# Patient Record
Sex: Female | Born: 1935 | Race: White | Hispanic: No | Smoking: Former smoker
Health system: Southern US, Community
[De-identification: ages and names within clinical notes are randomized; demographics above are authoritative.]

## PROBLEM LIST (undated history)

## (undated) DIAGNOSIS — K529 Noninfective gastroenteritis and colitis, unspecified: Secondary | ICD-10-CM

## (undated) DIAGNOSIS — E039 Hypothyroidism, unspecified: Secondary | ICD-10-CM

## (undated) DIAGNOSIS — I480 Paroxysmal atrial fibrillation: Secondary | ICD-10-CM

## (undated) DIAGNOSIS — Z9289 Personal history of other medical treatment: Secondary | ICD-10-CM

## (undated) DIAGNOSIS — K219 Gastro-esophageal reflux disease without esophagitis: Secondary | ICD-10-CM

## (undated) DIAGNOSIS — I10 Essential (primary) hypertension: Secondary | ICD-10-CM

## (undated) DIAGNOSIS — G8929 Other chronic pain: Secondary | ICD-10-CM

## (undated) DIAGNOSIS — I700 Atherosclerosis of aorta: Secondary | ICD-10-CM

## (undated) DIAGNOSIS — I447 Left bundle-branch block, unspecified: Secondary | ICD-10-CM

## (undated) DIAGNOSIS — M545 Low back pain, unspecified: Secondary | ICD-10-CM

## (undated) DIAGNOSIS — M1990 Unspecified osteoarthritis, unspecified site: Secondary | ICD-10-CM

## (undated) DIAGNOSIS — F0390 Unspecified dementia without behavioral disturbance: Secondary | ICD-10-CM

## (undated) HISTORY — PX: APPENDECTOMY: SHX54

## (undated) HISTORY — PX: CHOLECYSTECTOMY: SHX55

## (undated) HISTORY — PX: COLONOSCOPY: SHX174

## (undated) HISTORY — DX: Left bundle-branch block, unspecified: I44.7

## (undated) HISTORY — DX: Gastro-esophageal reflux disease without esophagitis: K21.9

## (undated) HISTORY — DX: Paroxysmal atrial fibrillation: I48.0

## (undated) HISTORY — PX: TONSILLECTOMY AND ADENOIDECTOMY: SUR1326

## (undated) HISTORY — DX: Hypothyroidism, unspecified: E03.9

## (undated) HISTORY — DX: Essential (primary) hypertension: I10

## (undated) HISTORY — DX: Personal history of other medical treatment: Z92.89

---

## 1997-05-06 ENCOUNTER — Ambulatory Visit (HOSPITAL_COMMUNITY): Admission: RE | Admit: 1997-05-06 | Discharge: 1997-05-06 | Payer: Self-pay | Admitting: Obstetrics & Gynecology

## 2007-04-24 ENCOUNTER — Ambulatory Visit: Payer: Self-pay | Admitting: Gastroenterology

## 2007-05-14 ENCOUNTER — Encounter: Payer: Self-pay | Admitting: Gastroenterology

## 2007-05-14 ENCOUNTER — Ambulatory Visit: Payer: Self-pay | Admitting: Gastroenterology

## 2007-05-14 DIAGNOSIS — K2940 Chronic atrophic gastritis without bleeding: Secondary | ICD-10-CM | POA: Insufficient documentation

## 2007-05-14 DIAGNOSIS — K5730 Diverticulosis of large intestine without perforation or abscess without bleeding: Secondary | ICD-10-CM | POA: Insufficient documentation

## 2007-07-22 ENCOUNTER — Ambulatory Visit: Payer: Self-pay | Admitting: Gastroenterology

## 2007-07-22 DIAGNOSIS — K5909 Other constipation: Secondary | ICD-10-CM

## 2007-07-22 DIAGNOSIS — R1013 Epigastric pain: Secondary | ICD-10-CM

## 2007-07-22 LAB — CONVERTED CEMR LAB
AST: 33 units/L (ref 0–37)
Alkaline Phosphatase: 41 units/L (ref 39–117)
Amylase: 106 units/L (ref 27–131)
Basophils Absolute: 0 10*3/uL (ref 0.0–0.1)
Basophils Relative: 0.4 % (ref 0.0–1.0)
Bilirubin, Direct: 0.1 mg/dL (ref 0.0–0.3)
Eosinophils Absolute: 0.1 10*3/uL (ref 0.0–0.7)
Lymphocytes Relative: 23.8 % (ref 12.0–46.0)
MCHC: 35 g/dL (ref 30.0–36.0)
MCV: 93.5 fL (ref 78.0–100.0)
Neutrophils Relative %: 64.3 % (ref 43.0–77.0)
Platelets: 247 10*3/uL (ref 150–400)
RBC: 4.19 M/uL (ref 3.87–5.11)
RDW: 13 % (ref 11.5–14.6)
Total Bilirubin: 0.9 mg/dL (ref 0.3–1.2)

## 2007-07-23 ENCOUNTER — Ambulatory Visit: Payer: Self-pay | Admitting: Cardiovascular Disease

## 2007-07-26 ENCOUNTER — Telehealth: Payer: Self-pay | Admitting: Gastroenterology

## 2007-07-31 ENCOUNTER — Telehealth: Payer: Self-pay | Admitting: Gastroenterology

## 2007-08-20 ENCOUNTER — Ambulatory Visit: Payer: Self-pay | Admitting: Gastroenterology

## 2007-08-23 ENCOUNTER — Ambulatory Visit (HOSPITAL_COMMUNITY): Admission: RE | Admit: 2007-08-23 | Discharge: 2007-08-23 | Payer: Self-pay | Admitting: Gastroenterology

## 2007-08-29 ENCOUNTER — Telehealth: Payer: Self-pay | Admitting: Gastroenterology

## 2008-05-05 ENCOUNTER — Inpatient Hospital Stay (HOSPITAL_COMMUNITY): Admission: EM | Admit: 2008-05-05 | Discharge: 2008-05-07 | Payer: Self-pay | Admitting: Emergency Medicine

## 2008-05-05 ENCOUNTER — Ambulatory Visit: Payer: Self-pay | Admitting: *Deleted

## 2008-05-07 ENCOUNTER — Encounter: Payer: Self-pay | Admitting: Cardiology

## 2008-05-07 HISTORY — PX: US ECHOCARDIOGRAPHY: HXRAD669

## 2009-03-24 HISTORY — PX: CARDIOVASCULAR STRESS TEST: SHX262

## 2009-09-21 ENCOUNTER — Ambulatory Visit: Payer: Self-pay | Admitting: Cardiology

## 2010-03-30 ENCOUNTER — Ambulatory Visit: Payer: Self-pay | Admitting: Cardiology

## 2010-05-11 LAB — CBC
HCT: 35.7 % — ABNORMAL LOW (ref 36.0–46.0)
HCT: 35.7 % — ABNORMAL LOW (ref 36.0–46.0)
Hemoglobin: 12.1 g/dL (ref 12.0–15.0)
Hemoglobin: 12.3 g/dL (ref 12.0–15.0)
MCHC: 34 g/dL (ref 30.0–36.0)
MCHC: 34.3 g/dL (ref 30.0–36.0)
MCV: 95.4 fL (ref 78.0–100.0)
MCV: 95.9 fL (ref 78.0–100.0)
Platelets: 170 10*3/uL (ref 150–400)
Platelets: 211 10*3/uL (ref 150–400)
RBC: 3.72 MIL/uL — ABNORMAL LOW (ref 3.87–5.11)
RBC: 3.74 MIL/uL — ABNORMAL LOW (ref 3.87–5.11)
RDW: 14.1 % (ref 11.5–15.5)
RDW: 14.4 % (ref 11.5–15.5)
WBC: 5.9 10*3/uL (ref 4.0–10.5)
WBC: 7.4 10*3/uL (ref 4.0–10.5)

## 2010-05-11 LAB — CARDIAC PANEL(CRET KIN+CKTOT+MB+TROPI)
CK, MB: 2.2 ng/mL (ref 0.3–4.0)
Relative Index: INVALID (ref 0.0–2.5)
Relative Index: INVALID (ref 0.0–2.5)
Total CK: 70 U/L (ref 7–177)
Troponin I: 0.12 ng/mL — ABNORMAL HIGH (ref 0.00–0.06)
Troponin I: 0.18 ng/mL — ABNORMAL HIGH (ref 0.00–0.06)

## 2010-05-11 LAB — CK TOTAL AND CKMB (NOT AT ARMC)
CK, MB: 1.2 ng/mL (ref 0.3–4.0)
Relative Index: INVALID (ref 0.0–2.5)
Total CK: 68 U/L (ref 7–177)

## 2010-05-11 LAB — POCT I-STAT, CHEM 8
BUN: 16 mg/dL (ref 6–23)
Calcium, Ion: 1.08 mmol/L — ABNORMAL LOW (ref 1.12–1.32)
Chloride: 107 mEq/L (ref 96–112)
Creatinine, Ser: 1.3 mg/dL — ABNORMAL HIGH (ref 0.4–1.2)
Glucose, Bld: 121 mg/dL — ABNORMAL HIGH (ref 70–99)
HCT: 35 % — ABNORMAL LOW (ref 36.0–46.0)
Hemoglobin: 11.9 g/dL — ABNORMAL LOW (ref 12.0–15.0)
Potassium: 3.3 mEq/L — ABNORMAL LOW (ref 3.5–5.1)
Sodium: 138 mEq/L (ref 135–145)
TCO2: 20 mmol/L (ref 0–100)

## 2010-05-11 LAB — BASIC METABOLIC PANEL
BUN: 8 mg/dL (ref 6–23)
BUN: 9 mg/dL (ref 6–23)
CO2: 23 mEq/L (ref 19–32)
CO2: 27 mEq/L (ref 19–32)
Calcium: 8.8 mg/dL (ref 8.4–10.5)
Calcium: 9 mg/dL (ref 8.4–10.5)
Chloride: 111 mEq/L (ref 96–112)
Creatinine, Ser: 0.81 mg/dL (ref 0.4–1.2)
Creatinine, Ser: 0.93 mg/dL (ref 0.4–1.2)
GFR calc Af Amer: >60 mL/min (ref >60)
GFR calc non Af Amer: >60 mL/min (ref >60)
Glucose, Bld: 124 mg/dL — ABNORMAL HIGH (ref 70–99)
Glucose, Bld: 98 mg/dL (ref 70–99)
Potassium: 4 mEq/L (ref 3.5–5.1)
Sodium: 141 mEq/L (ref 135–145)
Sodium: 143 mEq/L (ref 135–145)

## 2010-05-11 LAB — DIFFERENTIAL
Eosinophils Absolute: 0.1 10*3/uL (ref 0.0–0.7)
Eosinophils Relative: 2 % (ref 0–5)
Lymphocytes Relative: 32 % (ref 12–46)
Lymphs Abs: 2.4 10*3/uL (ref 0.7–4.0)
Monocytes Relative: 14 % — ABNORMAL HIGH (ref 3–12)

## 2010-05-11 LAB — LIPID PANEL
Cholesterol: 153 mg/dL (ref 0–200)
HDL: 69 mg/dL (ref >39)
LDL Cholesterol: 75 mg/dL (ref 0–99)
Total CHOL/HDL Ratio: 2.2 RATIO
Triglycerides: 44 mg/dL (ref <150)
VLDL: 9 mg/dL (ref 0–40)

## 2010-05-11 LAB — POCT CARDIAC MARKERS
CKMB, poc: <1 ng/mL — ABNORMAL LOW (ref 1.0–8.0)
CKMB, poc: <1 ng/mL — ABNORMAL LOW (ref 1.0–8.0)
Myoglobin, poc: 65.6 ng/mL (ref 12–200)
Myoglobin, poc: 73.7 ng/mL (ref 12–200)
Troponin i, poc: <0.05 ng/mL (ref 0.00–0.09)

## 2010-05-11 LAB — TSH: TSH: 4.297 u[IU]/mL (ref 0.350–4.500)

## 2010-05-11 LAB — PROTIME-INR
INR: 1.1 (ref 0.00–1.49)
Prothrombin Time: 14 seconds (ref 11.6–15.2)

## 2010-05-11 LAB — HEPARIN LEVEL (UNFRACTIONATED): Heparin Unfractionated: 0.39 IU/mL (ref 0.30–0.70)

## 2010-05-11 LAB — APTT: aPTT: 37 seconds (ref 24–37)

## 2010-06-14 NOTE — H&P (Signed)
Gina Terry, Gina Terry                 ACCOUNT NO.:  [id number]   MEDICAL RECORD NO.:  [id number]          PATIENT TYPE:  EMS   LOCATION:  MAJO                         FACILITY:  [facility]   PHYSICIAN:  Unice Cobble, MD     DATE OF BIRTH:  1921/07/17   DATE OF ADMISSION:  05/05/2008  DATE OF DISCHARGE:                              HISTORY & PHYSICAL   CARDIOLOGIST:  Dr. Patty Sermons of [facility].   CHIEF COMPLAINTS:  Chest pain.   HISTORY OF PRESENT ILLNESS:  This is an 77 year old [place] female with a  history of occasional palpitations but otherwise healthy who presents  with chest pain.  The patient was cooking at 1800 and had a sudden onset  of palpitations soon followed by chest tightness and mild diaphoresis.  She called EMS and was given four sublingual nitroglycerin for her chest  pain.  Her heart rate was greater than 200 at that time and she was  given metoprolol which slowed it to under 200.  In the Emergency  Department she was given adenosine to confirm atrial fibrillation/RVR  and then 20 mg of IV diltiazem which further slowed her heart rate and  relieved her pain.  She is now resting comfortably with a heart rate of  120 to 150 with a good blood pressure.  Her palpitations in the past  were similar to these but shorter in duration and without chest pain.  She does not complain of orthopnea, PND, or edema.  She is able to walk  up her steps from her basement to her main level with some mild  shortness of breath but no chest pain.   PAST MEDICAL HISTORY:  1. Osteoporosis.  2. Palpitations.  3. Questionable IBS.  4. GERD.  5. Status post appendectomy.  6. Status post cholecystectomy.  7. Status post tonsillectomy and adenoidectomy.   ALLERGIES:  POSSIBLY ALLERGIC TO CODEINE.   MEDICATIONS:  1. Evista 60 mg daily.  2. Aspirin 81 mg daily.  3. Atenolol 12.5 mg daily.  4. Tums daily.   SOCIAL HISTORY:  She lives in [place] with her husband.  She is a  retired Academic librarian.  She has a  35 pack-year history but quit  25 years ago.  She drinks a small glass of wine daily.  No drugs.   FAMILY HISTORY:  Her  mother died of old age at the age of 71.  Her  father died of heart attack at age of 34.  She has three sisters who all  died of breast cancer.  She has two brothers, one who died in his  74s  of pericarditis and one who died at 74 of a stroke.   REVIEW OF SYSTEMS:  Other than what is listed in the HPI, the patient  tells me that she has had nausea for months.  She has even had a  colonoscopy  and an  endoscopy looking for source of this and has not  found one.  It is not related to exertion.  Otherwise her complete  review of systems was negative except as  stated above.   PHYSICAL EXAMINATION:  Her temperature is afebrile with a pulse of 168,  lowered to 98 after diltiazem.  Respiratory rate is 16, blood pressure  is 111/78  and her O2 sats are 100% on 2 liters.  GENERAL:  She is  thin in no acute distress.  HEENT:  Shows PERRLA, EOMI, MMM, oropharynx without erythema or  exudates, good dentition.  NECK:  Supple without lymphadenopathy, thyromegaly, bruits or jugular  venous distention.  HEART:  Has an irregularly irregular rate without murmurs, gallops or  rubs.  Pulses are 2+ and equal bilaterally without bruits.  LUNGS:  Clear to auscultation bilaterally.  SKIN:  Shows some mild bruising but is otherwise normal.  ABDOMEN:  Soft and nontender with normal bowel sounds.  No rebound or  guarding.  EXTREMITIES:  Show no cyanosis, clubbing or edema.  MUSCULOSKELETAL:  Shows no joint deformity or effusions, no spine or CVA  tenderness.  NEUROLOGICALLY:  She is alert and oriented x3 with cranial nerves II-XII  grossly intact.  Strength is 5/5 all extremities and axial groups.   LABORATORY AND RADIOLOGY REVIEW:  Her EKG shows a rate of 162 in atrial  fibrillation with a left bundle branch block.  Her labs show a normal  Keeven  count with a hemoglobin of 12.3.  Her potassium is 3.3 with  creatinine of 1.3 and her CK-MB and troponin are negative at this time.   ASSESSMENT/PLAN:  This is a 77 year old [place] female with new onset  atrial fibrillation and rapid ventricular rate.  1. Atrial fibrillation.  I will control her rate tonight with a      diltiazem drip.  She will be anticoagulated with heparin and DC      cardioversion will be considered in the morning.  TSH and      echocardiogram will be checked.  2. Chest pain:  Likely demand ischemia.  Will follow troponins for      sign of damage.  Heart catheterization  was performed approximately      5 years prior per the patient at [facility] with results unknown as      I am unable to locate them in the computer system.  She may need a      repeat heart catheterization if evidence of heart damage occurs as      indicated by troponin positivity.  3. Left bundle branch block:  I have no old records of EKGs and I      suspect this to be old.  4. GI prophylaxis.      Unice Cobble, MD  Electronically Signed     ACJ/MEDQ  D:  05/05/2008  T:  05/05/2008  Job:  [id number]

## 2010-06-14 NOTE — Assessment & Plan Note (Signed)
[facility]                         GASTROENTEROLOGY OFFICE NOTE   NAME:Gina Terry                        MRN:          [id number]  DATE:04/24/2007                            DOB:          1921-08-20    REFERRING PHYSICIAN:  Veverly Fells. Altheimer, M.D.   REASON FOR CONSULTATION:  Abdominal discomfort, nausea and weight-loss.   Gina Terry is a pleasant, 77 year old [place] female, referred through the  courtesy of Dr. Leslie Dales for evaluation.  Over the past year, she has  developed increasing constipation.  She is having to strain at the stool  and has noticed decreased stool caliber.  Rarely has she seen blood on  the toilet tissue, which she attributes to hemorrhoids.  She is  complaining of vague lower abdominal pain.  In addition, she has had  frequent nausea.  She feels her nausea worsens with the day.  Both  nausea and pain are not related to eating or bowel movements.  She  denies pyrosis.  She is on no gastric irritants, including  nonsteroidals.   Recent blood work included a normal CBC, LFTs, and CMET.   PAST MEDICAL HISTORY:  Pertinent for arrhythmias and arthritis.  She is  status post cholecystectomy and appendectomy.   FAMILY HISTORY:  Pertinent for two sisters with breast cancer.   MEDICATIONS:  Include baby aspirin, Evista, atenolol and Amitiza.   She has no allergies.   She does not smoke.  She drinks rarely.  She is married and retired.  She takes care of her sickly husband.   REVIEW OF SYSTEMS:  Positive for joint pains, back pain, and sleeping  difficulties.   PHYSICAL EXAMINATION:  Pulse 68, blood pressure 142/78, weight 111.  HEENT: EOMI.  PERRLA.  Sclerae are anicteric.  Conjunctivae are pink.  NECK:  Supple without thyromegaly, adenopathy or carotid bruits.  CHEST:  Clear to auscultation and percussion without adventitious  sounds.  CARDIAC:  Regular rhythm; normal S1 S2.  There are no murmurs, gallops  or rubs.  ABDOMEN:  She has mild periumbilical tenderness to deep palpation.  She  has a palpable aorta, which may be the source of her sensitivity.  There  is fullness in the left lower quadrant, though no frank masses.  There  is no organomegaly.  EXTREMITIES:  Full range of motion.  No cyanosis, clubbing or edema.  RECTAL:  There are no masses.  Stool is Hemoccult negative.   IMPRESSION:  Abdominal discomfort and nausea with relatively new-onset  constipation and weight-loss.  Symptoms suggest both lower GI and upper  GI components.  Structural lesion of the colon, diverticulosis, and  nonspecific spasm are possibilities.  Nausea could be due to ulcer or  non-ulcer dyspepsia.   RECOMMENDATION:  Colonoscopy and upper endoscopy (to be done at the same  time).  If these tests are not diagnostic, I would consider CT of the  abdomen and pelvis.     Barbette Hair. Arlyce Dice, MD,FACG  Electronically Signed    RDK/MedQ  DD: 04/24/2007  DT: 04/24/2007  Job #: [id number]   cc:   Veverly Fells.  Altheimer, M.D.

## 2010-06-14 NOTE — Letter (Signed)
April 24, 2007    Mrs. Kristine Royal. Hankey   RE:  EVVIE, BEHRMANN  MRN:  [id number]  /  DOB:  Apr 30, 1921   Dear Mrs. Cerullo:   It is my pleasure to have treated you recently as a new patient in my  office.  I appreciate your confidence and the opportunity to participate  in your care.   Since I do have a busy inpatient endoscopy schedule and office schedule,  my office hours vary weekly.  I am, however, available for emergency  calls every day through my office.  If I cannot promptly meet an urgent  office appointment, another one of our gastroenterologists will be able  to assist you.   My well-trained staff are prepared to help you at all times.  For  emergencies after office hours, a physician from our gastroenterology  section is always available through my 24-hour answering service.   While you are under my care, I encourage discussion of your questions  and concerns, and I will be happy to return your calls as soon as I am  available.   Once again, I welcome you as a new patient and I look forward to a happy  and healthy relationship.    Sincerely,      Barbette Hair. Arlyce Dice, MD,FACG  Electronically Signed   RDK/MedQ  DD: 04/24/2007  DT: 04/24/2007  Job #: [id number]

## 2010-06-14 NOTE — Letter (Signed)
April 24, 2007    Veverly Fells. Altheimer, M.D.  [address]  [place], [place]  [address]   RE:  Gina Terry, Gina Terry  MRN:  [id number]  /  DOB:  09/04/1921   Dear Dr. Leslie Dales:   Upon your kind referral, I had the pleasure of evaluating your patient  and I am pleased to offer my findings.  I saw Gina Terry in the office  today.  Enclosed is a copy of my progress note that details my findings  and recommendations.   Thank you for the opportunity to participate in your patient's care.    Sincerely,      Barbette Hair. Arlyce Dice, MD,FACG  Electronically Signed    RDK/MedQ  DD: 04/24/2007  DT: 04/24/2007  Job #: [id number]

## 2010-06-14 NOTE — Discharge Summary (Signed)
Gina Terry, Gina Terry                 ACCOUNT NO.:  [id number]   MEDICAL RECORD NO.:  [id number]          PATIENT TYPE:  INP   LOCATION:  2915                         FACILITY:  [facility]   PHYSICIAN:  Elmore Guise., M.D.DATE OF BIRTH:  July 15, 1921   DATE OF ADMISSION:  05/05/2008  DATE OF DISCHARGE:  05/07/2008                               DISCHARGE SUMMARY   DISCHARGE DIAGNOSES:  1. Atrial fibrillation with conversion to normal sinus rhythm after      rate control was obtained.  2. Left bundle-branch block.   HISTORY OF PRESENT ILLNESS:  Ms. Winget is an 77 year old [place] female  who presented to the hospital with tachycardia.  She was found to be in  rapid atrial fibrillation with left bundle-branch block morphology.  In  the emergency room, she was given adenosine to confirm her atrial  fibrillation.  She was then placed on Cardizem drip.  After rate control  was obtained, she converted to normal sinus rhythm.  She has now been on  oral Cardizem at 60 mg 3 times daily and maintaining her heart rate in  the 50-70 range.  Her blood pressure has been stable.  She has been up  and ambulatory.  She has had no further episodes of her atrial  fibrillation.  She will be discharged home today to continue the  following medications:  1. Aspirin 81 mg daily.  2. Evista 60 mg daily.  3. Cardizem 60 mg 2 times daily.   She was advised to stop her atenolol at this time.  She will follow up  with either Dr. Patty Sermons or Dr. Reyes Ivan at [facility] in 2  weeks.  Her echo was done at the hospital, however, results are still  pending prior to dictation.  Should she have any further problems with  palpitations or tachycardia, she is to take an extra Cardizem dose and  she is then to call the office.  If she has problems with her heart, not  responding to oral medications, she is then to go to the hospital so  that she can get IV medicines.  We discussed potential rhythm  medications,  however, since this is her first episode, we decided not to  place her on any rhythm medicines at this time.  All her questions were  answered prior to discharge.      Elmore Guise., M.D.  Electronically Signed     TWK/MEDQ  D:  05/07/2008  T:  05/08/2008  Job:  [id number]

## 2010-08-26 ENCOUNTER — Encounter: Payer: Self-pay | Admitting: Cardiology

## 2010-08-29 ENCOUNTER — Ambulatory Visit (INDEPENDENT_AMBULATORY_CARE_PROVIDER_SITE_OTHER): Payer: Medicare Other | Admitting: Cardiology

## 2010-08-29 ENCOUNTER — Encounter: Payer: Self-pay | Admitting: Cardiology

## 2010-08-29 DIAGNOSIS — I447 Left bundle-branch block, unspecified: Secondary | ICD-10-CM

## 2010-08-29 DIAGNOSIS — I4891 Unspecified atrial fibrillation: Secondary | ICD-10-CM

## 2010-08-29 DIAGNOSIS — I480 Paroxysmal atrial fibrillation: Secondary | ICD-10-CM | POA: Insufficient documentation

## 2010-08-29 NOTE — Assessment & Plan Note (Signed)
She is having infrequent and nonsustained symptoms of palpitations. We will continue with aspirin and diltiazem. I will plan to follow up again in one year.

## 2010-08-29 NOTE — Patient Instructions (Signed)
Continue your current medications.  Stay active.  I will see you again in 1 year.

## 2010-08-29 NOTE — Progress Notes (Signed)
   Gina Terry Date of Birth: 1921-09-25   History of Present Illness: Gina Terry is seen for yearly followup. She has been doing about the same. She still has an occasional slight sinking feeling in her chest with mild palpitations. These episodes are very brief. They usually go away with rest. Last week she got up from bed and had a pain between her shoulder blades. She broke out into a sweat. This discomfort lasted about 20 minutes and the sweating lasted only 2 minutes. She does have a history of paroxysmal atrial fibrillation. She has a chance core of one. She had a normal nuclear stress test in February of 2011.  Current Outpatient Prescriptions on File Prior to Visit  Medication Sig Dispense Refill  . Acetaminophen (TYLENOL PO) Take by mouth.        Marland Kitchen aspirin 81 MG tablet Take 81 mg by mouth daily.        Marland Kitchen diltiazem (CARDIZEM CD) 180 MG 24 hr capsule Take 180 mg by mouth daily.        . Levothyroxine Sodium (SYNTHROID PO) Take by mouth.        . zolpidem (AMBIEN) 5 MG tablet Take 5 mg by mouth at bedtime as needed.        . raloxifene (EVISTA) 60 MG tablet Take 60 mg by mouth daily.          Allergies  Allergen Reactions  . Codeine     Past Medical History  Diagnosis Date  . Paroxysmal atrial fibrillation   . Left bundle branch block   . Osteoporosis   . GERD (gastroesophageal reflux disease)   . Hypothyroid     Past Surgical History  Procedure Date  . Appendectomy   . Cholecystectomy   . Tonsillectomy and adenoidectomy   . US echocardiography 05-07-2008    Est EF 50-55%  . Cardiovascular stress test 03-24-2009    EF 78%    History  Smoking status  . Former Smoker -- 0.0 packs/day  . Types: Cigarettes  . Quit date: 01/31/1984  Smokeless tobacco  . Not on file    History  Alcohol Use     History reviewed. No pertinent family history.  Review of Systems: As noted in history of present illness.  All other systems were reviewed and are  negative.  Physical Exam: BP 118/82  Pulse 68  Ht 5\' 2"  (1.575 m)  Wt 99 lb 6.4 oz (45.088 kg)  BMI 18.18 kg/m2 She is a pleasant elderly [place] female in no acute distress. She walks with a cane. Her HEENT exam is unremarkable. She has no JVD, adenopathy, thyromegaly, or bruits. Lungs are clear. Cardiac exam reveals a regular rate and rhythm with a very soft 1/6 flow murmur at the left sternal border. Abdomen is soft and nontender. She has no masses or bruits. She rates are without edema. Pedal pulses are good. She is alert and oriented x3. Cranial nerves II through XII are intact. LABORATORY DATA:   Assessment / Plan:

## 2010-10-05 ENCOUNTER — Other Ambulatory Visit: Payer: Self-pay | Admitting: Cardiology

## 2010-10-05 MED ORDER — DILTIAZEM HCL ER COATED BEADS 180 MG PO CP24: 180.0000 mg | ORAL_CAPSULE | Freq: Every day | ORAL | Status: DC

## 2010-10-05 NOTE — Telephone Encounter (Signed)
Pt has questions about her prescription for Deltizam.  Please call pt back today.

## 2010-10-05 NOTE — Telephone Encounter (Signed)
escribe medication per fax request  

## 2010-10-05 NOTE — Telephone Encounter (Signed)
lm

## 2011-02-01 DIAGNOSIS — H348392 Tributary (branch) retinal vein occlusion, unspecified eye, stable: Secondary | ICD-10-CM | POA: Diagnosis not present

## 2011-04-06 ENCOUNTER — Other Ambulatory Visit: Payer: Self-pay | Admitting: Cardiology

## 2011-04-06 MED ORDER — DILTIAZEM HCL ER COATED BEADS 180 MG PO CP24: 180.0000 mg | ORAL_CAPSULE | Freq: Every day | ORAL | Status: DC

## 2011-04-06 NOTE — Telephone Encounter (Signed)
Out of pills 

## 2011-05-23 DIAGNOSIS — I10 Essential (primary) hypertension: Secondary | ICD-10-CM | POA: Diagnosis not present

## 2011-05-23 DIAGNOSIS — I779 Disorder of arteries and arterioles, unspecified: Secondary | ICD-10-CM | POA: Diagnosis not present

## 2011-06-09 DIAGNOSIS — I720 Aneurysm of carotid artery: Secondary | ICD-10-CM | POA: Diagnosis not present

## 2011-08-30 DIAGNOSIS — I10 Essential (primary) hypertension: Secondary | ICD-10-CM | POA: Diagnosis not present

## 2011-08-30 DIAGNOSIS — Z0000 Encounter for general adult medical examination without abnormal findings: Secondary | ICD-10-CM | POA: Diagnosis not present

## 2011-09-05 DIAGNOSIS — Z0000 Encounter for general adult medical examination without abnormal findings: Secondary | ICD-10-CM | POA: Diagnosis not present

## 2011-09-05 DIAGNOSIS — M810 Age-related osteoporosis without current pathological fracture: Secondary | ICD-10-CM | POA: Diagnosis not present

## 2011-09-05 DIAGNOSIS — I10 Essential (primary) hypertension: Secondary | ICD-10-CM | POA: Diagnosis not present

## 2011-09-05 DIAGNOSIS — K219 Gastro-esophageal reflux disease without esophagitis: Secondary | ICD-10-CM | POA: Diagnosis not present

## 2011-09-05 DIAGNOSIS — I4891 Unspecified atrial fibrillation: Secondary | ICD-10-CM | POA: Diagnosis not present

## 2011-09-05 DIAGNOSIS — R7989 Other specified abnormal findings of blood chemistry: Secondary | ICD-10-CM | POA: Diagnosis not present

## 2011-09-05 DIAGNOSIS — K589 Irritable bowel syndrome without diarrhea: Secondary | ICD-10-CM | POA: Diagnosis not present

## 2011-10-04 ENCOUNTER — Encounter: Payer: Self-pay | Admitting: Cardiology

## 2011-10-04 ENCOUNTER — Ambulatory Visit (INDEPENDENT_AMBULATORY_CARE_PROVIDER_SITE_OTHER): Payer: Medicare Other | Admitting: Cardiology

## 2011-10-04 VITALS — BP 154/80 | HR 76 | Ht 62.0 in | Wt 102.0 lb

## 2011-10-04 DIAGNOSIS — I4891 Unspecified atrial fibrillation: Secondary | ICD-10-CM

## 2011-10-04 DIAGNOSIS — I447 Left bundle-branch block, unspecified: Secondary | ICD-10-CM

## 2011-10-04 NOTE — Progress Notes (Signed)
   Gina Terry Date of Birth: 01-14-1922   History of Present Illness: Gina Terry is seen for yearly followup.  She does have a history of paroxysmal atrial fibrillation. She has a [place] score of one. She had a normal nuclear stress test in February of 2011. She reports that she has had a good year. She still notes occasional skipped beat and has rarely had any racing. Typically it lasts less than 1 minute. She also reports her blood pressure at home is usually quite low with a typical reading of 102 systolic. She only get short of breath if she walks along moist.  Current Outpatient Prescriptions on File Prior to Visit  Medication Sig Dispense Refill  . aspirin 81 MG tablet Take 81 mg by mouth daily.        Marland Kitchen diltiazem (CARDIZEM CD) 180 MG 24 hr capsule Take 1 capsule (180 mg total) by mouth daily.  30 capsule  5  . Levothyroxine Sodium (SYNTHROID PO) Take by mouth.        . zolpidem (AMBIEN) 5 MG tablet Take 5 mg by mouth at bedtime as needed.          Allergies  Allergen Reactions  . Codeine     Past Medical History  Diagnosis Date  . Paroxysmal atrial fibrillation   . Left bundle branch block   . Osteoporosis   . GERD (gastroesophageal reflux disease)   . Hypothyroid     Past Surgical History  Procedure Date  . Appendectomy   . Cholecystectomy   . Tonsillectomy and adenoidectomy   . US echocardiography 05-07-2008    Est EF 50-55%  . Cardiovascular stress test 03-24-2009    EF 78%    History  Smoking status  . Former Smoker -- 0.0 packs/day  . Types: Cigarettes  . Quit date: 01/31/1984  Smokeless tobacco  . Not on file    History  Alcohol Use     History reviewed. No pertinent family history.  Review of Systems: As noted in history of present illness.  All other systems were reviewed and are negative.  Physical Exam: BP 154/80  Pulse 76  Ht 5\' 2"  (1.575 m)  Wt 46.267 kg (102 lb)  BMI 18.66 kg/m2 She is a pleasant elderly Gina Terry female in no acute  distress. She walks with a cane. Her HEENT exam is unremarkable. She has no JVD, adenopathy, thyromegaly, or bruits. Lungs are clear. Cardiac exam reveals a regular rate and rhythm with a very soft 1/6 flow murmur at the left sternal border. Abdomen is soft and nontender. She has no masses or bruits. She rates are without edema. Pedal pulses are good. She is alert and oriented x3. Cranial nerves II through XII are intact. LABORATORY DATA: ECG demonstrates normal sinus rhythm with a left bundle branch block.  Assessment / Plan: 1. Paroxysmal atrial fibrillation. No significant sustained episodes. Continue aspirin and diltiazem. Given her stability we will see her back on an as-needed basis.  2. Left bundle branch block, chronic.

## 2011-10-04 NOTE — Patient Instructions (Signed)
Continue your medication  I will see you as needed.

## 2011-10-06 ENCOUNTER — Other Ambulatory Visit: Payer: Self-pay

## 2011-10-06 MED ORDER — DILTIAZEM HCL ER COATED BEADS 180 MG PO CP24: 180.0000 mg | ORAL_CAPSULE | Freq: Every day | ORAL | Status: DC

## 2011-10-17 DIAGNOSIS — H903 Sensorineural hearing loss, bilateral: Secondary | ICD-10-CM | POA: Diagnosis not present

## 2011-11-01 DIAGNOSIS — Z23 Encounter for immunization: Secondary | ICD-10-CM | POA: Diagnosis not present

## 2012-03-08 DIAGNOSIS — Z961 Presence of intraocular lens: Secondary | ICD-10-CM | POA: Diagnosis not present

## 2012-03-08 DIAGNOSIS — R51 Headache: Secondary | ICD-10-CM | POA: Diagnosis not present

## 2012-03-08 DIAGNOSIS — H524 Presbyopia: Secondary | ICD-10-CM | POA: Diagnosis not present

## 2012-03-08 DIAGNOSIS — H52209 Unspecified astigmatism, unspecified eye: Secondary | ICD-10-CM | POA: Diagnosis not present

## 2012-03-27 DIAGNOSIS — L219 Seborrheic dermatitis, unspecified: Secondary | ICD-10-CM | POA: Diagnosis not present

## 2012-03-27 DIAGNOSIS — L259 Unspecified contact dermatitis, unspecified cause: Secondary | ICD-10-CM | POA: Diagnosis not present

## 2012-09-16 DIAGNOSIS — Z0000 Encounter for general adult medical examination without abnormal findings: Secondary | ICD-10-CM | POA: Diagnosis not present

## 2012-09-16 DIAGNOSIS — K219 Gastro-esophageal reflux disease without esophagitis: Secondary | ICD-10-CM | POA: Diagnosis not present

## 2012-09-16 DIAGNOSIS — I10 Essential (primary) hypertension: Secondary | ICD-10-CM | POA: Diagnosis not present

## 2012-09-16 DIAGNOSIS — K589 Irritable bowel syndrome without diarrhea: Secondary | ICD-10-CM | POA: Diagnosis not present

## 2012-09-16 DIAGNOSIS — R7989 Other specified abnormal findings of blood chemistry: Secondary | ICD-10-CM | POA: Diagnosis not present

## 2012-09-16 DIAGNOSIS — R946 Abnormal results of thyroid function studies: Secondary | ICD-10-CM | POA: Diagnosis not present

## 2012-09-16 DIAGNOSIS — I4891 Unspecified atrial fibrillation: Secondary | ICD-10-CM | POA: Diagnosis not present

## 2012-09-16 DIAGNOSIS — M810 Age-related osteoporosis without current pathological fracture: Secondary | ICD-10-CM | POA: Diagnosis not present

## 2012-10-14 DIAGNOSIS — Z23 Encounter for immunization: Secondary | ICD-10-CM | POA: Diagnosis not present

## 2013-05-23 DIAGNOSIS — H04129 Dry eye syndrome of unspecified lacrimal gland: Secondary | ICD-10-CM | POA: Diagnosis not present

## 2013-05-23 DIAGNOSIS — H52209 Unspecified astigmatism, unspecified eye: Secondary | ICD-10-CM | POA: Diagnosis not present

## 2013-05-23 DIAGNOSIS — H43819 Vitreous degeneration, unspecified eye: Secondary | ICD-10-CM | POA: Diagnosis not present

## 2013-05-23 DIAGNOSIS — Z961 Presence of intraocular lens: Secondary | ICD-10-CM | POA: Diagnosis not present

## 2013-09-11 DIAGNOSIS — R7989 Other specified abnormal findings of blood chemistry: Secondary | ICD-10-CM | POA: Diagnosis not present

## 2013-09-11 DIAGNOSIS — Z0000 Encounter for general adult medical examination without abnormal findings: Secondary | ICD-10-CM | POA: Diagnosis not present

## 2013-09-11 DIAGNOSIS — I4891 Unspecified atrial fibrillation: Secondary | ICD-10-CM | POA: Diagnosis not present

## 2013-09-11 DIAGNOSIS — I10 Essential (primary) hypertension: Secondary | ICD-10-CM | POA: Diagnosis not present

## 2013-09-11 DIAGNOSIS — K219 Gastro-esophageal reflux disease without esophagitis: Secondary | ICD-10-CM | POA: Diagnosis not present

## 2013-09-11 DIAGNOSIS — M810 Age-related osteoporosis without current pathological fracture: Secondary | ICD-10-CM | POA: Diagnosis not present

## 2013-09-11 DIAGNOSIS — K589 Irritable bowel syndrome without diarrhea: Secondary | ICD-10-CM | POA: Diagnosis not present

## 2013-11-04 DIAGNOSIS — Z23 Encounter for immunization: Secondary | ICD-10-CM | POA: Diagnosis not present

## 2014-06-15 ENCOUNTER — Encounter: Payer: Self-pay | Admitting: Gastroenterology

## 2014-07-28 DIAGNOSIS — M810 Age-related osteoporosis without current pathological fracture: Secondary | ICD-10-CM | POA: Diagnosis not present

## 2014-07-28 DIAGNOSIS — I10 Essential (primary) hypertension: Secondary | ICD-10-CM | POA: Diagnosis not present

## 2014-07-28 DIAGNOSIS — M545 Low back pain: Secondary | ICD-10-CM | POA: Diagnosis not present

## 2014-08-24 DIAGNOSIS — I10 Essential (primary) hypertension: Secondary | ICD-10-CM | POA: Diagnosis not present

## 2014-08-24 DIAGNOSIS — M810 Age-related osteoporosis without current pathological fracture: Secondary | ICD-10-CM | POA: Diagnosis not present

## 2014-08-24 DIAGNOSIS — M545 Low back pain: Secondary | ICD-10-CM | POA: Diagnosis not present

## 2014-09-29 DIAGNOSIS — M545 Low back pain: Secondary | ICD-10-CM | POA: Diagnosis not present

## 2014-09-29 DIAGNOSIS — M810 Age-related osteoporosis without current pathological fracture: Secondary | ICD-10-CM | POA: Diagnosis not present

## 2014-09-29 DIAGNOSIS — I10 Essential (primary) hypertension: Secondary | ICD-10-CM | POA: Diagnosis not present

## 2014-10-26 DIAGNOSIS — Z23 Encounter for immunization: Secondary | ICD-10-CM | POA: Diagnosis not present

## 2014-12-01 ENCOUNTER — Emergency Department (HOSPITAL_COMMUNITY)
Admission: EM | Admit: 2014-12-01 | Discharge: 2014-12-02 | Disposition: A | Discharge status: Home / Self Care | Payer: Medicare Other | Attending: Emergency Medicine | Admitting: Emergency Medicine

## 2014-12-01 ENCOUNTER — Encounter (HOSPITAL_COMMUNITY): Payer: Self-pay

## 2014-12-01 DIAGNOSIS — R079 Chest pain, unspecified: Secondary | ICD-10-CM | POA: Diagnosis present

## 2014-12-01 DIAGNOSIS — Z8719 Personal history of other diseases of the digestive system: Secondary | ICD-10-CM | POA: Diagnosis not present

## 2014-12-01 DIAGNOSIS — E039 Hypothyroidism, unspecified: Secondary | ICD-10-CM | POA: Insufficient documentation

## 2014-12-01 DIAGNOSIS — Z87891 Personal history of nicotine dependence: Secondary | ICD-10-CM | POA: Insufficient documentation

## 2014-12-01 DIAGNOSIS — I4891 Unspecified atrial fibrillation: Secondary | ICD-10-CM | POA: Insufficient documentation

## 2014-12-01 DIAGNOSIS — R0789 Other chest pain: Secondary | ICD-10-CM | POA: Diagnosis not present

## 2014-12-01 DIAGNOSIS — Z7982 Long term (current) use of aspirin: Secondary | ICD-10-CM | POA: Diagnosis not present

## 2014-12-01 DIAGNOSIS — M1990 Unspecified osteoarthritis, unspecified site: Secondary | ICD-10-CM | POA: Diagnosis not present

## 2014-12-01 HISTORY — DX: Unspecified osteoarthritis, unspecified site: M19.90

## 2014-12-01 LAB — CBC WITH DIFFERENTIAL/PLATELET
Basophils Absolute: 0 10*3/uL (ref 0.0–0.1)
Basophils Relative: 0 %
Eosinophils Absolute: 0.1 10*3/uL (ref 0.0–0.7)
Eosinophils Relative: 1 %
HCT: 38.8 % (ref 36.0–46.0)
Hemoglobin: 13.5 g/dL (ref 12.0–15.0)
Lymphocytes Relative: 21 %
Lymphs Abs: 1.5 10*3/uL (ref 0.7–4.0)
MCH: 31.7 pg (ref 26.0–34.0)
MCHC: 34.8 g/dL (ref 30.0–36.0)
MCV: 91.1 fL (ref 78.0–100.0)
Monocytes Absolute: 2.3 10*3/uL — ABNORMAL HIGH (ref 0.1–1.0)
Monocytes Relative: 32 %
Neutro Abs: 3.2 10*3/uL (ref 1.7–7.7)
Neutrophils Relative %: 46 %
Platelets: 208 10*3/uL (ref 150–400)
RBC: 4.26 MIL/uL (ref 3.87–5.11)
RDW: 13.4 % (ref 11.5–15.5)
WBC: 7.1 10*3/uL (ref 4.0–10.5)

## 2014-12-01 LAB — BASIC METABOLIC PANEL
Anion gap: 9 (ref 5–15)
BUN: 21 mg/dL — ABNORMAL HIGH (ref 6–20)
CO2: 27 mmol/L (ref 22–32)
Calcium: 9.7 mg/dL (ref 8.9–10.3)
Chloride: 98 mmol/L — ABNORMAL LOW (ref 101–111)
Creatinine, Ser: 1.28 mg/dL — ABNORMAL HIGH (ref 0.44–1.00)
GFR calc Af Amer: 41 mL/min — ABNORMAL LOW (ref >60)
GFR calc non Af Amer: 35 mL/min — ABNORMAL LOW (ref >60)
Glucose, Bld: 106 mg/dL — ABNORMAL HIGH (ref 65–99)
Potassium: 4 mmol/L (ref 3.5–5.1)
Sodium: 134 mmol/L — ABNORMAL LOW (ref 135–145)

## 2014-12-01 LAB — TROPONIN I: Troponin I: <0.03 ng/mL (ref <0.031)

## 2014-12-01 NOTE — ED Notes (Signed)
Per [facility]: pt called out for weakness about 1 hour, walked into the kitchen. Pt has a hx of Afib, paroxysmal, on diltiaziem, takes it daily. HR 160-200, felt dizzy and had a little bit of chest pain. [facility] did vagal maneuvers with a straw which brought her heart rate down to 80 and regular. EKG showed LBBB, has a hx of same. Pt took 324 ASA prior to EMS arrival.

## 2014-12-01 NOTE — ED Notes (Signed)
No nausea, no vomiting, clear lung sounds.

## 2014-12-01 NOTE — ED Provider Notes (Signed)
CSN: [id number]     Arrival date & time 12/01/14  2117 History   First MD Initiated Contact with Patient 12/01/14 2120     Chief Complaint  Patient presents with  . Atrial Fibrillation     (Consider location/radiation/quality/duration/timing/severity/associated sxs/prior Treatment) HPI   89yf with palpitations, dizziness and chest pressure. Onset around an hour prior to arrival. Called neighbor who came over and took pulse which she reports was around 160. Has hx of paroxsymal afib. Apparently in afib when ems arrived. Converted with vagal maneuvers. Currently in sinus rhythm and has no complaints. No fever or chills. Reports compliance with meds.  Past Medical History  Diagnosis Date  . Paroxysmal atrial fibrillation (HCC)   . Left bundle branch block   . Osteoporosis   . GERD (gastroesophageal reflux disease)   . Hypothyroid   . Arthritis    Past Surgical History  Procedure Laterality Date  . Appendectomy    . Cholecystectomy    . Tonsillectomy and adenoidectomy    . US echocardiography  05-07-2008    Est EF 50-55%  . Cardiovascular stress test  03-24-2009    EF 78%   No family history on file. Social History  Substance Use Topics  . Smoking status: Former Smoker -- 0.00 packs/day    Types: Cigarettes    Quit date: 01/31/1984  . Smokeless tobacco: Never Used  . Alcohol Use: Yes     Comment: 3 oz of wine before dinner every night   OB History    No data available     Review of Systems  All systems reviewed and negative, other than as noted in HPI.   Allergies  Codeine  Home Medications   Prior to Admission medications   Medication Sig Start Date End Date Taking? Authorizing Provider  aspirin 81 MG tablet Take 81 mg by mouth daily.      Historical Provider, MD  diltiazem (CARDIZEM CD) 180 MG 24 hr capsule Take 1 capsule (180 mg total) by mouth daily. 10/06/11   Peter M Martinique, MD  Levothyroxine Sodium (SYNTHROID PO) Take by mouth.      Historical Provider, MD   VITAMIN E PO Take by mouth as directed.    Historical Provider, MD  zolpidem (AMBIEN) 5 MG tablet Take 5 mg by mouth at bedtime as needed.      Historical Provider, MD   BP 163/67 mmHg  Pulse 73  Temp(Src) 98.1 F (36.7 C) (Oral)  Resp 17  Ht 5\' 2"  (1.575 m)  Wt 103 lb (46.72 kg)  BMI 18.83 kg/m2  SpO2 99% Physical Exam  Constitutional: She appears well-developed and well-nourished. No distress.  HENT:  Head: Normocephalic and atraumatic.  Eyes: Conjunctivae are normal. Right eye exhibits no discharge. Left eye exhibits no discharge.  Neck: Neck supple.  Cardiovascular: Normal rate, regular rhythm and normal heart sounds.  Exam reveals no gallop and no friction rub.   No murmur heard. Pulmonary/Chest: Effort normal and breath sounds normal. No respiratory distress.  Abdominal: Soft. She exhibits no distension. There is no tenderness.  Musculoskeletal: She exhibits no edema or tenderness.  Lower extremities symmetric as compared to each other. No calf tenderness. Negative Homan's. No palpable cords.   Neurological: She is alert.  Skin: Skin is warm and dry.  Psychiatric: She has a normal mood and affect. Her behavior is normal. Thought content normal.  Nursing note and vitals reviewed.   ED Course  Procedures (including critical care time) Labs Review Labs  Reviewed  CBC WITH DIFFERENTIAL/PLATELET - Abnormal; Notable for the following:    Monocytes Absolute 2.3 (*)    All other components within normal limits  BASIC METABOLIC PANEL - Abnormal; Notable for the following:    Sodium 134 (*)    Chloride 98 (*)    Glucose, Bld 106 (*)    BUN 21 (*)    Creatinine, Ser 1.28 (*)    GFR calc non Af Amer 35 (*)    GFR calc Af Amer 41 (*)    All other components within normal limits  TROPONIN I    Imaging Review No results found. I have personally reviewed and evaluated these images and lab results as part of my medical decision-making.   EKG Interpretation   Date/Time:   Tuesday December 01 2014 21:38:30 EDT Ventricular Rate:  78 PR Interval:  177 QRS Duration: 130 QT Interval:  407 QTC Calculation: 464 R Axis:   12 Text Interpretation:  Sinus rhythm Left atrial enlargement Left bundle  branch block Confirmed by Tonga Prout  MD, Devaughn Savant ([id number]) on 12/01/2014 11:00:44  PM      MDM   Final diagnoses:  Atrial fibrillation, unspecified type ([place])   89y  female with generalized weakness prior to arrival. In atrial fibrillation for EMS. Converted prior to arrival. She has no further complaints. We'll check some basic labs. Assuming nursing and abnormalities, anticipate discharge. Sounds like she is overall very controlled on Cardizem.    Virgel Manifold, MD 12/10/14 2234

## 2014-12-02 NOTE — Discharge Instructions (Signed)
Atrial Fibrillation °Atrial fibrillation is a type of heartbeat that is irregular or fast (rapid). If you have this condition, your heart keeps quivering in a weird (chaotic) way. This condition can make it so your heart cannot pump blood normally. Having this condition gives a person more risk for stroke, heart failure, and other heart problems. There are different types of atrial fibrillation. Talk with your doctor to learn about the type that you have. °HOME CARE °· Take over-the-counter and prescription medicines only as told by your doctor. °· If your doctor prescribed a blood-thinning medicine, take it exactly as told. Taking too much of it can cause bleeding. If you do not take enough of it, you will not have the protection that you need against stroke and other problems. °· Do not use any tobacco products. These include cigarettes, chewing tobacco, and e-cigarettes. If you need help quitting, ask your doctor. °· If you have apnea (obstructive sleep apnea), manage it as told by your doctor. °· Do not drink alcohol. °· Do not drink beverages that have caffeine. These include coffee, soda, and tea. °· Maintain a healthy weight. Do not use diet pills unless your doctor says they are safe for you. Diet pills may make heart problems worse. °· Follow diet instructions as told by your doctor. °· Exercise regularly as told by your doctor. °· Keep all follow-up visits as told by your doctor. This is important. °GET HELP IF: °· You notice a change in the speed, rhythm, or strength of your heartbeat. °· You are taking a blood-thinning medicine and you notice more bruising. °· You get tired more easily when you move or exercise. °GET HELP RIGHT AWAY IF: °· You have pain in your chest or your belly (abdomen). °· You have sweating or weakness. °· You feel sick to your stomach (nauseous). °· You notice blood in your throw up (vomit), poop (stool), or pee (urine). °· You are short of breath. °· You suddenly have swollen feet  and ankles. °· You feel dizzy. °· Your suddenly get weak or numb in your face, arms, or legs, especially if it happens on one side of your body. °· You have trouble talking, trouble understanding, or both. °· Your face or your eyelid droops on one side. °These symptoms may be an emergency. Do not wait to see if the symptoms will go away. Get medical help right away. Call your local emergency services (911 in the [place] Do not drive yourself to the hospital. °  °This information is not intended to replace advice given to you by your health care provider. Make sure you discuss any questions you have with your health care provider. °  °Document Released: 10/26/2007 Document Revised: 10/07/2014 Document Reviewed: 05/13/2014 °Elsevier Interactive Patient Education ©2016 [employer] ° °

## 2015-03-12 DIAGNOSIS — H524 Presbyopia: Secondary | ICD-10-CM | POA: Diagnosis not present

## 2015-03-12 DIAGNOSIS — Z961 Presence of intraocular lens: Secondary | ICD-10-CM | POA: Diagnosis not present

## 2015-03-12 DIAGNOSIS — H04123 Dry eye syndrome of bilateral lacrimal glands: Secondary | ICD-10-CM | POA: Diagnosis not present

## 2015-04-06 DIAGNOSIS — I10 Essential (primary) hypertension: Secondary | ICD-10-CM | POA: Diagnosis not present

## 2015-04-06 DIAGNOSIS — K219 Gastro-esophageal reflux disease without esophagitis: Secondary | ICD-10-CM | POA: Diagnosis not present

## 2015-04-06 DIAGNOSIS — M810 Age-related osteoporosis without current pathological fracture: Secondary | ICD-10-CM | POA: Diagnosis not present

## 2015-04-06 DIAGNOSIS — K589 Irritable bowel syndrome without diarrhea: Secondary | ICD-10-CM | POA: Diagnosis not present

## 2015-04-06 DIAGNOSIS — M545 Low back pain: Secondary | ICD-10-CM | POA: Diagnosis not present

## 2015-04-06 DIAGNOSIS — E038 Other specified hypothyroidism: Secondary | ICD-10-CM | POA: Diagnosis not present

## 2015-09-27 DIAGNOSIS — I10 Essential (primary) hypertension: Secondary | ICD-10-CM | POA: Diagnosis not present

## 2015-09-27 DIAGNOSIS — E038 Other specified hypothyroidism: Secondary | ICD-10-CM | POA: Diagnosis not present

## 2015-09-27 DIAGNOSIS — M810 Age-related osteoporosis without current pathological fracture: Secondary | ICD-10-CM | POA: Diagnosis not present

## 2015-09-27 DIAGNOSIS — K589 Irritable bowel syndrome without diarrhea: Secondary | ICD-10-CM | POA: Diagnosis not present

## 2015-09-27 DIAGNOSIS — Z23 Encounter for immunization: Secondary | ICD-10-CM | POA: Diagnosis not present

## 2015-09-27 DIAGNOSIS — M545 Low back pain: Secondary | ICD-10-CM | POA: Diagnosis not present

## 2015-09-27 DIAGNOSIS — K219 Gastro-esophageal reflux disease without esophagitis: Secondary | ICD-10-CM | POA: Diagnosis not present

## 2016-03-08 DIAGNOSIS — C44329 Squamous cell carcinoma of skin of other parts of face: Secondary | ICD-10-CM | POA: Diagnosis not present

## 2016-03-08 DIAGNOSIS — D485 Neoplasm of uncertain behavior of skin: Secondary | ICD-10-CM | POA: Diagnosis not present

## 2016-04-19 DIAGNOSIS — M545 Low back pain, unspecified: Secondary | ICD-10-CM | POA: Insufficient documentation

## 2016-04-19 DIAGNOSIS — K219 Gastro-esophageal reflux disease without esophagitis: Secondary | ICD-10-CM | POA: Diagnosis not present

## 2016-04-19 DIAGNOSIS — G8929 Other chronic pain: Secondary | ICD-10-CM | POA: Insufficient documentation

## 2016-04-19 DIAGNOSIS — I480 Paroxysmal atrial fibrillation: Secondary | ICD-10-CM | POA: Diagnosis not present

## 2016-04-19 DIAGNOSIS — I10 Essential (primary) hypertension: Secondary | ICD-10-CM

## 2016-04-19 DIAGNOSIS — M810 Age-related osteoporosis without current pathological fracture: Secondary | ICD-10-CM | POA: Diagnosis not present

## 2016-04-19 DIAGNOSIS — E039 Hypothyroidism, unspecified: Secondary | ICD-10-CM | POA: Diagnosis not present

## 2016-04-19 DIAGNOSIS — K581 Irritable bowel syndrome with constipation: Secondary | ICD-10-CM | POA: Diagnosis not present

## 2016-04-19 HISTORY — DX: Essential (primary) hypertension: I10

## 2016-05-08 DIAGNOSIS — H52203 Unspecified astigmatism, bilateral: Secondary | ICD-10-CM | POA: Diagnosis not present

## 2016-05-08 DIAGNOSIS — Z961 Presence of intraocular lens: Secondary | ICD-10-CM | POA: Diagnosis not present

## 2016-05-08 DIAGNOSIS — H43813 Vitreous degeneration, bilateral: Secondary | ICD-10-CM | POA: Diagnosis not present

## 2016-05-08 DIAGNOSIS — H04123 Dry eye syndrome of bilateral lacrimal glands: Secondary | ICD-10-CM | POA: Diagnosis not present

## 2016-09-26 DIAGNOSIS — M810 Age-related osteoporosis without current pathological fracture: Secondary | ICD-10-CM | POA: Diagnosis not present

## 2016-09-26 DIAGNOSIS — K219 Gastro-esophageal reflux disease without esophagitis: Secondary | ICD-10-CM | POA: Diagnosis not present

## 2016-09-26 DIAGNOSIS — K581 Irritable bowel syndrome with constipation: Secondary | ICD-10-CM | POA: Diagnosis not present

## 2016-09-26 DIAGNOSIS — M545 Low back pain: Secondary | ICD-10-CM | POA: Diagnosis not present

## 2016-09-26 DIAGNOSIS — I10 Essential (primary) hypertension: Secondary | ICD-10-CM | POA: Diagnosis not present

## 2016-09-26 DIAGNOSIS — G8929 Other chronic pain: Secondary | ICD-10-CM | POA: Diagnosis not present

## 2016-09-26 DIAGNOSIS — E039 Hypothyroidism, unspecified: Secondary | ICD-10-CM | POA: Diagnosis not present

## 2016-10-31 DIAGNOSIS — Z23 Encounter for immunization: Secondary | ICD-10-CM | POA: Diagnosis not present

## 2017-04-05 NOTE — Progress Notes (Signed)
Cardiology Office Note   Date:  04/07/2017   ID:  [name], [name] [birth year], MRN [id number]  PCP:  Lorne Skeens, MD  Cardiologist:   Peter Martinique, MD   Chief Complaint  Patient presents with  . Chest Pain      History of Present Illness: Gina Terry is a 84 y.o. female who is seen at the request of Dr. Elyse Hsu for evaluation of angina. She was seen by me previously- last in 2013. She has a history of LBBB and paroxysmal Afib. She had a normal nuclear stress test in 2011. Echo in past apparently OK. She has been managed with rate control and ASA.   Today she presents with symptoms of progressive angina. She states symptoms began about 4 months ago. She describes chest pain in the mid sternal area radiating into her back and down both arms. Symptoms are relieved with rest. Symptoms have progressed to the point where she gets pain walking from one room to the next or brushing her teeth. She states she is no longer doing her grocery shopping because of this. Denies dyspnea or diaphoresis. She notes that after eating supper she will sometimes get a wave of weakness come over her and she sees wavy lines in her vision. She has to sit down until symptoms resolve. She also feels her heart pounding but not like when she had Afib in the past. Symptoms are occurring daily now. Her last episode of Afib occurred in November 2016 and converted spontaneously.  She still lives independently in home by herself. She has one daughter living in [place] and a son living in [place]. Not much support system here although she states a neighbor will help her out if needed.     Past Medical History:  Diagnosis Date  . Arthritis   . GERD (gastroesophageal reflux disease)   . Hypothyroid   . Left bundle branch block   . Osteoporosis   . Paroxysmal atrial fibrillation [facility])     Past Surgical History:  Procedure Laterality Date  . APPENDECTOMY    . CARDIOVASCULAR STRESS TEST  03-24-2009   EF  78%  . CHOLECYSTECTOMY    . TONSILLECTOMY AND ADENOIDECTOMY    . US ECHOCARDIOGRAPHY  05-07-2008   Est EF 50-55%     Current Outpatient Medications  Medication Sig Dispense Refill  . acetaminophen (TYLENOL) 500 MG tablet Take 500 mg by mouth every 6 (six) hours as needed for mild pain.    Marland Kitchen aspirin 81 MG tablet Take 81 mg by mouth daily.      Marland Kitchen diltiazem (CARDIZEM CD) 180 MG 24 hr capsule Take 1 capsule (180 mg total) by mouth daily. 30 capsule 5  . levothyroxine (SYNTHROID, LEVOTHROID) 50 MCG tablet Take 50 mcg by mouth daily before breakfast.    . VITAMIN E PO Take 2,000 Units by mouth as directed.     . zolpidem (AMBIEN) 5 MG tablet Take 2.5 mg by mouth at bedtime.     . metoprolol succinate (TOPROL-XL) 50 MG 24 hr tablet Take 1 tablet (50 mg total) by mouth daily. Take with or immediately following a meal. 90 tablet 3  . nitroGLYCERIN (NITROSTAT) 0.4 MG SL tablet Place 1 tablet (0.4 mg total) under the tongue every 5 (five) minutes as needed for chest pain. 90 tablet 3   No current facility-administered medications for this visit.     Allergies:   Nifedipine; Rofecoxib; Sulfamethoxazole-trimethoprim; Codeine; Estradiol; Flurbiprofen; and Ibuprofen  Social History:  The patient  reports that she quit smoking about 33 years ago. Her smoking use included cigarettes. She smoked 0.00 packs per day. she has never used smokeless tobacco. She reports that she drinks alcohol. She reports that she does not use drugs.   Family History:  The patient's family history includes Cancer in her sister; Heart attack in her father; Hip fracture in her mother; Stroke in her brother.    ROS:  Please see the history of present illness.   Otherwise, review of systems are positive for none.   All other systems are reviewed and negative.    PHYSICAL EXAM: VS:  BP 140/60   Pulse 85   Ht 5\' 2"  (1.575 m)   Wt 98 lb 9.6 oz (44.7 kg)   BMI 18.03 kg/m  , BMI Body mass index is 18.03 kg/m. GEN: Well  nourished, well developed WF, in no acute distress  HEENT: normal  Neck: no JVD, bilateral carotid bruits with diminished upstroke, no masses Cardiac:RRR; there is a harsh 3/6 systolic murmur heard best at the apex radiating to RUSB and into carotids. A2 is inaudible. There is a prominent LV lift.  Respiratory:  clear to auscultation bilaterally, normal work of breathing GI: soft, nontender, nondistended, + BS, midline abdominal bruit.  MS: no deformity or atrophy  Skin: warm and dry, no rash Neuro:  Strength and sensation are intact Psych: euthymic mood, full affect   EKG:  EKG is ordered today. The ekg ordered today demonstrates NSR with rate 85. LBBB. I have personally reviewed and interpreted this study.    Recent Labs: 04/06/2017: ALT 14; BUN 20; Creatinine, Ser 1.16; Hemoglobin 13.2; Platelets 245; Potassium 4.4; Sodium 139; TSH 3.060    Lipid Panel    Component Value Date/Time   CHOL 165 04/06/2017 1420   TRIG 69 04/06/2017 1420   HDL 76 04/06/2017 1420   CHOLHDL 2.2 05/06/2008 0425   VLDL 9 05/06/2008 0425   LDLCALC 75 04/06/2017 1420      Wt Readings from Last 3 Encounters:  04/06/17 98 lb 9.6 oz (44.7 kg)  12/01/14 103 lb (46.7 kg)  10/04/11 102 lb (46.3 kg)      Other studies Reviewed: Additional studies/ records that were reviewed today include:  Labs dated 09/27/15: cholesterol 192, triglycerides 91, HDL 93, LDL 100. Chemistries, TSH, Hgb normal.    ASSESSMENT AND PLAN:  1.  Unstable angina. Potential mechanisms include progressive CAD, Critical AS, Aneurysmal disease. She is on Diltiazem. Given advanced age will initially try conservative therapy. Will add Toprol SL 50 mg daily. Given sl Ntg to use prn and instructed in its use. Told to call 911 if she has pain unresolved with 2 sl Ntg. Will arrange for Echo. Follow up in 2-3 weeks after Echo to assess response to therapy. If symptoms do not improve I think she would be a candidate for invasive evaluation  with cardiac cath. Although almost 84 years old she is still very functional and independent.   2. Murmur. Exam concerning for aortic stenosis. Will assess with Echo.  3. Paroxysmal Afib. On diltiazem for rate control. Unclear why she has not been considered for anticoagulation therapy in the past. Will await above result before addressing this issue  4. LBBB.    Current medicines are reviewed at length with the patient today.  The patient does not have concerns regarding medicines.  The following changes have been made:  See above  Labs/ tests ordered today include:  Orders Placed This Encounter  Procedures  . Comprehensive Metabolic Panel (CMET)  . Lipid Panel w/o Chol/HDL Ratio  . CBC w/Diff/Platelet  . TSH  . EKG 12-Lead  . ECHOCARDIOGRAM COMPLETE     Disposition:   FU with me in 3 weeks  Signed, Peter Martinique, MD  04/07/2017 7:08 AM    [facility] [address] [place], [place], [address] Phone [phone], Fax [phone]

## 2017-04-06 ENCOUNTER — Ambulatory Visit (INDEPENDENT_AMBULATORY_CARE_PROVIDER_SITE_OTHER): Payer: Medicare Other | Admitting: Cardiology

## 2017-04-06 ENCOUNTER — Encounter: Payer: Self-pay | Admitting: Cardiology

## 2017-04-06 VITALS — BP 140/60 | HR 85 | Ht 62.0 in | Wt 98.6 lb

## 2017-04-06 DIAGNOSIS — I200 Unstable angina: Secondary | ICD-10-CM

## 2017-04-06 DIAGNOSIS — I480 Paroxysmal atrial fibrillation: Secondary | ICD-10-CM

## 2017-04-06 DIAGNOSIS — R011 Cardiac murmur, unspecified: Secondary | ICD-10-CM

## 2017-04-06 MED ORDER — NITROGLYCERIN 0.4 MG SL SUBL: 0.4000 mg | SUBLINGUAL_TABLET | SUBLINGUAL | 3 refills | Status: DC | PRN

## 2017-04-06 MED ORDER — METOPROLOL SUCCINATE ER 50 MG PO TB24: 50.0000 mg | ORAL_TABLET | Freq: Every day | ORAL | 3 refills | Status: DC

## 2017-04-06 NOTE — Patient Instructions (Addendum)
Continue your current therapy  We will start Toprol XL 50 mg daily  Use Ntg under the tongue as needed for angina.   We will schedule you for an Echocardiogram  We will check lab work today  I will see you back in 2-3 weeks

## 2017-04-07 ENCOUNTER — Encounter: Payer: Self-pay | Admitting: Cardiology

## 2017-04-07 LAB — COMPREHENSIVE METABOLIC PANEL
ALK PHOS: 63 IU/L (ref 39–117)
ALT: 14 IU/L (ref 0–32)
AST: 25 IU/L (ref 0–40)
Albumin/Globulin Ratio: 2 (ref 1.2–2.2)
Albumin: 4.5 g/dL (ref 3.2–4.6)
BILIRUBIN TOTAL: 0.2 mg/dL (ref 0.0–1.2)
BUN/Creatinine Ratio: 17 (ref 12–28)
BUN: 20 mg/dL (ref 10–36)
CHLORIDE: 100 mmol/L (ref 96–106)
CO2: 25 mmol/L (ref 20–29)
CREATININE: 1.16 mg/dL — AB (ref 0.57–1.00)
Calcium: 10.1 mg/dL (ref 8.7–10.3)
GFR calc Af Amer: 47 mL/min/{1.73_m2} — ABNORMAL LOW (ref >59)
GFR calc non Af Amer: 41 mL/min/{1.73_m2} — ABNORMAL LOW (ref >59)
GLUCOSE: 97 mg/dL (ref 65–99)
Globulin, Total: 2.2 g/dL (ref 1.5–4.5)
Potassium: 4.4 mmol/L (ref 3.5–5.2)
SODIUM: 139 mmol/L (ref 134–144)
Total Protein: 6.7 g/dL (ref 6.0–8.5)

## 2017-04-07 LAB — CBC WITH DIFFERENTIAL/PLATELET
BASOS: 0 %
Basophils Absolute: 0 10*3/uL (ref 0.0–0.2)
EOS (ABSOLUTE): 0 10*3/uL (ref 0.0–0.4)
Eos: 0 %
HEMATOCRIT: 38.6 % (ref 34.0–46.6)
Hemoglobin: 13.2 g/dL (ref 11.1–15.9)
IMMATURE GRANS (ABS): 0.1 10*3/uL (ref 0.0–0.1)
Immature Granulocytes: 1 %
LYMPHS ABS: 2.9 10*3/uL (ref 0.7–3.1)
LYMPHS: 27 %
MCH: 30.1 pg (ref 26.6–33.0)
MCHC: 34.2 g/dL (ref 31.5–35.7)
MCV: 88 fL (ref 79–97)
MONOCYTES: 18 %
Monocytes Absolute: 1.9 10*3/uL — ABNORMAL HIGH (ref 0.1–0.9)
NEUTROS ABS: 5.9 10*3/uL (ref 1.4–7.0)
Neutrophils: 54 %
Platelets: 245 10*3/uL (ref 150–379)
RBC: 4.38 x10E6/uL (ref 3.77–5.28)
RDW: 14 % (ref 12.3–15.4)
WBC: 10.8 10*3/uL (ref 3.4–10.8)

## 2017-04-07 LAB — LIPID PANEL W/O CHOL/HDL RATIO
CHOLESTEROL TOTAL: 165 mg/dL (ref 100–199)
HDL: 76 mg/dL (ref >39)
LDL Calculated: 75 mg/dL (ref 0–99)
TRIGLYCERIDES: 69 mg/dL (ref 0–149)
VLDL CHOLESTEROL CAL: 14 mg/dL (ref 5–40)

## 2017-04-07 LAB — TSH: TSH: 3.06 u[IU]/mL (ref 0.450–4.500)

## 2017-04-18 ENCOUNTER — Ambulatory Visit (HOSPITAL_COMMUNITY): Payer: Medicare Other | Attending: Cardiology

## 2017-04-18 ENCOUNTER — Other Ambulatory Visit: Payer: Self-pay

## 2017-04-18 DIAGNOSIS — R011 Cardiac murmur, unspecified: Secondary | ICD-10-CM

## 2017-04-18 DIAGNOSIS — I080 Rheumatic disorders of both mitral and aortic valves: Secondary | ICD-10-CM | POA: Diagnosis not present

## 2017-04-18 DIAGNOSIS — E039 Hypothyroidism, unspecified: Secondary | ICD-10-CM | POA: Diagnosis not present

## 2017-04-18 DIAGNOSIS — I480 Paroxysmal atrial fibrillation: Secondary | ICD-10-CM | POA: Diagnosis not present

## 2017-04-18 DIAGNOSIS — I447 Left bundle-branch block, unspecified: Secondary | ICD-10-CM | POA: Insufficient documentation

## 2017-04-18 DIAGNOSIS — I200 Unstable angina: Secondary | ICD-10-CM | POA: Insufficient documentation

## 2017-04-18 DIAGNOSIS — I313 Pericardial effusion (noninflammatory): Secondary | ICD-10-CM | POA: Diagnosis not present

## 2017-04-20 ENCOUNTER — Telehealth: Payer: Self-pay | Admitting: Cardiology

## 2017-04-20 NOTE — Telephone Encounter (Signed)
Returned the call to the patient. She stated that she was calling to give her blood pressure and heart rate since starting Toprol 50 mg daily. Her blood pressure was 134/65 and heart rate 44. She stated that her heart rate has been in the 40's lately and she has been weak and tired more so than normal otherwise she feels fine. Message routed to the provider for his information.

## 2017-04-20 NOTE — Telephone Encounter (Signed)
Would reduce Toprol to 25 mg daily and monitor.

## 2017-04-20 NOTE — Telephone Encounter (Signed)
Gina Terry is calling to give her bp and pulse which was 134/65 and pulse 44. Please call

## 2017-04-20 NOTE — Telephone Encounter (Signed)
Returned call to patient Dr.Jordan's advice given.Advised to monitor pulse and call back if pulse continues to be low.

## 2017-05-13 NOTE — Progress Notes (Signed)
Cardiology Office Note   Date:  05/15/2017   ID:  Gina, Terry [birth year], MRN [id number]  PCP:  Lorne Skeens, MD  Cardiologist:   Leylah Tarnow Martinique, MD   Chief Complaint  Patient presents with  . Chest Pain    states off and on   . Shortness of Breath    a little and also some dizziness off and on       History of Present Illness: Gina Terry is a 84 y.o. female who is seen for follow up of angina.  She was seen by me previously- last in 2013. She has a history of LBBB and paroxysmal Afib. She had a normal nuclear stress test in 2011. Echo in past apparently OK. She has been managed with rate control and ASA.   She presented recently  with symptoms of progressive angina. She states symptoms began about 4 months ago. She describes chest pain in the mid sternal area radiating into her back and down both arms. Symptoms are relieved with rest. Symptoms have progressed to the point where she gets pain walking from one room to the next or brushing her teeth. She states she is no longer doing her grocery shopping because of this. Denies dyspnea or diaphoresis. She notes that after eating supper she will sometimes get a wave of weakness come over her and she sees wavy lines in her vision. She has to sit down until symptoms resolve. She also feels her heart pounding but not like when she had Afib in the past. Symptoms are occurring daily now. Her last episode of Afib occurred in November 2016 and converted spontaneously.  She still lives independently in home by herself. She has one daughter living in [place] and a son living in [place]. Not much support system here although she states a neighbor will help her out if needed.   Due to her advanced age an initial conservative approach was pursued. She was started on Toprol XL 50 mg daily and given sl Ntg to use prn. An Echo was ordered. We later had to reduce the Toprol to 25 mg daily due to bradycardia with HR in the 40s.   On follow up  today she is seen with her daughter. She is doing better. Her chest pain has resolved. She does have occ. Tightness but has not taken sl Ntg. She does feel wobbly/dizzy and has less energy. Has stopped going to the store. Family wants her to stop doing laundry since she has to go into basement to do so. She still reports some HR in 40s.     Past Medical History:  Diagnosis Date  . Arthritis   . GERD (gastroesophageal reflux disease)   . Hypothyroid   . Left bundle branch block   . Osteoporosis   . Paroxysmal atrial fibrillation [facility])     Past Surgical History:  Procedure Laterality Date  . APPENDECTOMY    . CARDIOVASCULAR STRESS TEST  03-24-2009   EF 78%  . CHOLECYSTECTOMY    . TONSILLECTOMY AND ADENOIDECTOMY    . US ECHOCARDIOGRAPHY  05-07-2008   Est EF 50-55%     Current Outpatient Medications  Medication Sig Dispense Refill  . acetaminophen (TYLENOL) 500 MG tablet Take 500 mg by mouth every 6 (six) hours as needed for mild pain.    Marland Kitchen aspirin 81 MG tablet Take 81 mg by mouth daily.      Marland Kitchen levothyroxine (SYNTHROID, LEVOTHROID) 50 MCG tablet Take 50 mcg by  mouth daily before breakfast.    . metoprolol succinate (TOPROL XL) 25 MG 24 hr tablet Take 1 tablet (25 mg total) by mouth daily. 30 tablet 6  . nitroGLYCERIN (NITROSTAT) 0.4 MG SL tablet Place 1 tablet (0.4 mg total) under the tongue every 5 (five) minutes as needed for chest pain. 90 tablet 3  . VITAMIN E PO Take 2,000 Units by mouth as directed.     . zolpidem (AMBIEN) 5 MG tablet Take 2.5 mg by mouth at bedtime.     Marland Kitchen amLODipine (NORVASC) 2.5 MG tablet Take 1 tablet (2.5 mg total) by mouth daily. 180 tablet 3   No current facility-administered medications for this visit.     Allergies:   Rofecoxib; Sulfamethoxazole-trimethoprim; Codeine; Estradiol; Flurbiprofen; and Ibuprofen    Social History:  The patient  reports that she quit smoking about 33 years ago. Her smoking use included cigarettes. She smoked 0.00 packs per  day. She has never used smokeless tobacco. She reports that she drinks alcohol. She reports that she does not use drugs.   Family History:  The patient's family history includes Cancer in her sister; Heart attack in her father; Hip fracture in her mother; Stroke in her brother.    ROS:  Please see the history of present illness.   Otherwise, review of systems are positive for none.   All other systems are reviewed and negative.    PHYSICAL EXAM: VS:  BP (!) 148/65   Pulse (!) 53   Ht 5\' 2"  (1.575 m)   Wt 98 lb (44.5 kg)   SpO2 96%   BMI 17.92 kg/m  , BMI Body mass index is 17.92 kg/m. GEN: Well nourished, well developed WF, in no acute distress  HEENT: normal  Neck: no JVD, bilateral carotid bruits with diminished upstroke, no masses Cardiac:RRR; there is a harsh 8-4/6 systolic murmur heard best at the apex radiating to RUSB and into carotids.There is a prominent LV lift.  Respiratory:  clear to auscultation bilaterally, normal work of breathing GI: soft, nontender, nondistended, + BS, midline abdominal bruit.  MS: no deformity or atrophy  Skin: warm and dry, no rash Neuro:  Strength and sensation are intact Psych: euthymic mood, full affect   EKG:  EKG is not ordered today.   Recent Labs: 04/06/2017: ALT 14; BUN 20; Creatinine, Ser 1.16; Hemoglobin 13.2; Platelets 245; Potassium 4.4; Sodium 139; TSH 3.060    Lipid Panel    Component Value Date/Time   CHOL 165 04/06/2017 1420   TRIG 69 04/06/2017 1420   HDL 76 04/06/2017 1420   CHOLHDL 2.2 05/06/2008 0425   VLDL 9 05/06/2008 0425   LDLCALC 75 04/06/2017 1420      Wt Readings from Last 3 Encounters:  05/15/17 98 lb (44.5 kg)  04/06/17 98 lb 9.6 oz (44.7 kg)  12/01/14 103 lb (46.7 kg)      Other studies Reviewed: Additional studies/ records that were reviewed today include:  Labs dated 09/27/15: cholesterol 192, triglycerides 91, HDL 93, LDL 100. Chemistries, TSH, Hgb normal.   Echo 04/18/17: Study  Conclusions  - Left ventricle: The cavity size was normal. Wall thickness was   increased in a pattern of mild LVH. Septal bounce suggestive of   LBBB, otherwise normal wall motion. Systolic function was normal.   The estimated ejection fraction was in the range of 55% to 60%.   Doppler parameters are consistent with abnormal left ventricular   relaxation (grade 1 diastolic dysfunction). - Aortic valve:  Trileaflet; severely calcified leaflets. There was   mild stenosis. Mean gradient (S): 12 mm Hg. Peak gradient (S): 19   mm Hg. Valve area (VTI): 1.86 cm^2. - Mitral valve: Mildly calcified annulus. There was no significant   regurgitation. - Left atrium: The atrium was mildly dilated. - Right ventricle: The cavity size was normal. Systolic function   was normal. - Tricuspid valve: Peak RV-RA gradient (S): 27 mm Hg. - Pulmonary arteries: PA peak pressure: 30 mm Hg (S). - Inferior vena cava: The vessel was normal in size. The   respirophasic diameter changes were in the normal range (= 50%),   consistent with normal central venous pressure. - Pericardium, extracardiac: A trivial pericardial effusion was   identified.  Impressions:  - Normal LV size with mild LV hypertrophy. EF 55-60% with septal   bounce suggestive of LBBB. Normal RV size and systolic function.   Mild aortic stenosis.    ASSESSMENT AND PLAN:  1.  Angina pectoris.symptoms have improved with addition of beta blocker. She is having some bradycardia.  Given advanced age will initially try conservative therapy. Will continue Toprol SL 25 mg daily. Given sl Ntg to use prn and instructed in its use. Will switch diltiazem to amlodipine 2.5 mg daily to see if this will help with bradycardia.   2. Mild Aortic stenosis.  3. Paroxysmal Afib. Now on Toprol XL for rate control.  No recurrence since 2016. If Afib should recur would have to discuss anticoagulation  4. LBBB.    Current medicines are reviewed at length  with the patient today.  The patient does not have concerns regarding medicines.  The following changes have been made:  See above  Labs/ tests ordered today include:   No orders of the defined types were placed in this encounter.    Disposition:   FU with me in 3 months.  Signed, Shawniece Oyola Martinique, MD  05/15/2017 2:12 PM    [facility] [address], [place], [place], [address] Phone [phone], Fax [phone]

## 2017-05-15 ENCOUNTER — Encounter: Payer: Self-pay | Admitting: Cardiology

## 2017-05-15 ENCOUNTER — Ambulatory Visit (INDEPENDENT_AMBULATORY_CARE_PROVIDER_SITE_OTHER): Payer: Medicare Other | Admitting: Cardiology

## 2017-05-15 VITALS — BP 148/65 | HR 53 | Ht 62.0 in | Wt 98.0 lb

## 2017-05-15 DIAGNOSIS — I209 Angina pectoris, unspecified: Secondary | ICD-10-CM | POA: Diagnosis not present

## 2017-05-15 DIAGNOSIS — I200 Unstable angina: Secondary | ICD-10-CM

## 2017-05-15 DIAGNOSIS — I480 Paroxysmal atrial fibrillation: Secondary | ICD-10-CM | POA: Diagnosis not present

## 2017-05-15 DIAGNOSIS — I447 Left bundle-branch block, unspecified: Secondary | ICD-10-CM

## 2017-05-15 DIAGNOSIS — I350 Nonrheumatic aortic (valve) stenosis: Secondary | ICD-10-CM | POA: Diagnosis not present

## 2017-05-15 MED ORDER — AMLODIPINE BESYLATE 2.5 MG PO TABS: 2.5000 mg | ORAL_TABLET | Freq: Every day | ORAL | 3 refills | Status: DC

## 2017-05-15 NOTE — Patient Instructions (Signed)
Stop taking diltiazem  We will add amlodipine 2.5 mg daily  Continue your other therapy  I will see you in 3 months.

## 2017-05-21 ENCOUNTER — Encounter: Payer: Self-pay | Admitting: Podiatry

## 2017-05-21 ENCOUNTER — Telehealth: Payer: Self-pay | Admitting: *Deleted

## 2017-05-21 ENCOUNTER — Ambulatory Visit (INDEPENDENT_AMBULATORY_CARE_PROVIDER_SITE_OTHER): Payer: Medicare Other | Admitting: Podiatry

## 2017-05-21 DIAGNOSIS — I200 Unstable angina: Secondary | ICD-10-CM | POA: Diagnosis not present

## 2017-05-21 DIAGNOSIS — L03031 Cellulitis of right toe: Secondary | ICD-10-CM | POA: Diagnosis not present

## 2017-05-21 NOTE — Telephone Encounter (Signed)
Pt states she doesn't understand the soaking instructions.

## 2017-05-21 NOTE — Telephone Encounter (Signed)
I spoke pt and explained the epsom salt soak instructions 20 minutes twice daily followed by antibiotic ointment for about 2-4 weeks until the area got a dry hard scab without drainage, redness or swelling. I told pt she may have the soak longer the 1st time to get the dressings off and to soak after the shower. Pt states understanding.

## 2017-05-21 NOTE — Patient Instructions (Addendum)
Soak Instructions    THE DAY AFTER THE PROCEDURE  Place 1/4 cup of epsom salts in a quart of warm tap water.  Submerge your foot or feet with outer bandage intact for the initial soak; this will allow the bandage to become moist and wet for easy lift off.  Once you remove your bandage, continue to soak in the solution for 20 minutes.  This soak should be done twice a day.  Next, remove your foot or feet from solution, blot dry the affected area and cover.  You may use a band aid large enough to cover the area or use gauze and tape.  Apply other medications to the area as directed by the doctor such as polysporin neosporin.  IF YOUR SKIN BECOMES IRRITATED WHILE USING THESE INSTRUCTIONS, IT IS OKAY TO SWITCH TO  [place]ulin VINEGAR AND WATER. Or you may use antibacterial soap and water to keep the toe clean  Monitor for any signs/symptoms of infection. Call the office immediately if any occur or go directly to the emergency room. Call with any questions/concerns.   

## 2017-05-22 NOTE — Progress Notes (Signed)
Subjective:   Patient ID: Gina Terry, female   DOB: 84 y.o.   MRN: [id number]   HPI Patient points to the right big toe and states that it has been inflamed and sore and there was some drainage noted this week.  She is been soaking it and padding.  Patient does not smoke likes to be active   Review of Systems  All other systems reviewed and are negative.       Objective:  Physical Exam  Constitutional: She appears well-developed and well-nourished.  Cardiovascular: Intact distal pulses.  Pulmonary/Chest: Effort normal.  Musculoskeletal: Normal range of motion.  Neurological: She is alert.  Skin: Skin is warm.  Nursing note and vitals reviewed.   Neurovascular status was found to be intact with diminished muscle strength and range of motion commensurate with age.  Patient was noted to have an inflamed right hallux lateral border that is local with no proximal edema erythema or drainage noted and pain with palpation.  Patient has good digital perfusion well oriented x3     Assessment:  Inflammatory paronychia infection right hallux lateral border with pain     Plan:  H&P condition reviewed and recommended correction of deformity.  Today I infiltrated the right hallux 60 mill grams like Marcaine mixture sterile prep applied to the right toe with sterile his mentation remove the lateral border created channel for drainage and removed all necrotic flesh.  Patient will be seen back to recheck

## 2017-05-23 ENCOUNTER — Telehealth: Payer: Self-pay | Admitting: Podiatry

## 2017-05-23 NOTE — Telephone Encounter (Signed)
I had an ingrown toenail procedure done on Monday. I would like to speak to a nurse about the progress of my toe. My telephone number is [phone]. I would appreciate a call. Thank you very much.

## 2017-05-23 NOTE — Telephone Encounter (Signed)
Pt states she soaked her toe yesterday and the water was a little too hot, then the toe became more red and swollen. I told pt to be careful of the water temperature and as long as the toe returned to the near procedure color and degree of swelling she should be okay. Pt states understanding.

## 2017-07-30 ENCOUNTER — Encounter: Payer: Self-pay | Admitting: Cardiology

## 2017-08-07 NOTE — Progress Notes (Signed)
Cardiology Office Note   Date:  08/08/2017   ID:  Gina Terry, Gina Terry 27-Apr-1921, MRN [id number]  PCP:  Gina Skeens, MD  Cardiologist:   Gina Martinique, MD   Chief Complaint  Patient presents with  . Chest Pain  . Coronary Artery Disease      History of Present Illness: Gina Terry is a 84 y.o. female who is seen for follow up of angina.  She was seen by me previously in 2013. She has a history of LBBB and paroxysmal Afib. She had a normal nuclear stress test in 2011. Echo in past apparently OK. She has been managed with rate control and ASA.   She presented in March 2019 with symptoms of progressive angina. She described chest pain in the mid sternal area radiating into her back and down both arms. Symptoms are relieved with rest. Symptoms have progressed to the point where she gets pain walking from one room to the next or brushing her teeth. She states she was no longer doing her grocery shopping because of this. Denied dyspnea or diaphoresis.  Her last episode of Afib occurred in November 2016 and converted spontaneously.  She still lives independently in home by herself. She has one daughter living in [place] and a son living in [place]. Not much support system here although she states a neighbor will help her out if needed.   Due to her advanced age an initial conservative approach was pursued. She was started on Toprol XL 50 mg daily and given sl Ntg to use prn. An Echo was ordered. We later had to reduce the Toprol to 25 mg daily due to bradycardia with HR in the 40s.   On follow up today she  is doing much better. Her chest pain has resolved. She does have very infrequent palpitations.  She has not had to take sl Ntg. She doesn't do a lot but is able to go up stairs at home. Will go the store with her daughter occasionally. Daughter lives in [place] and does most of her shopping for her.   She does complain of some sciatica pain at night in left leg.     Past  Medical History:  Diagnosis Date  . Arthritis   . GERD (gastroesophageal reflux disease)   . Hypertension, essential 04/19/2016  . Hypothyroid   . Left bundle branch block   . Osteoporosis   . Paroxysmal atrial fibrillation [facility])     Past Surgical History:  Procedure Laterality Date  . APPENDECTOMY    . CARDIOVASCULAR STRESS TEST  03-24-2009   EF 78%  . CHOLECYSTECTOMY    . TONSILLECTOMY AND ADENOIDECTOMY    . US ECHOCARDIOGRAPHY  05-07-2008   Est EF 50-55%     Current Outpatient Medications  Medication Sig Dispense Refill  . acetaminophen (TYLENOL) 500 MG tablet Take 500 mg by mouth every 6 (six) hours as needed for mild pain.    Marland Kitchen amLODipine (NORVASC) 2.5 MG tablet Take 1 tablet (2.5 mg total) by mouth daily. 180 tablet 3  . aspirin 81 MG tablet Take 81 mg by mouth daily.      Marland Kitchen ibuprofen (ADVIL,MOTRIN) 200 MG tablet Take 200 mg by mouth every 6 (six) hours as needed.    Marland Kitchen levothyroxine (SYNTHROID, LEVOTHROID) 50 MCG tablet Take 50 mcg by mouth daily before breakfast.    . metoprolol succinate (TOPROL XL) 25 MG 24 hr tablet Take 12.5 mg by mouth daily.  30 tablet 6  .  VITAMIN E PO Take 2,000 Units by mouth as directed.     . zolpidem (AMBIEN) 5 MG tablet Take 2.5 mg by mouth at bedtime.     . nitroGLYCERIN (NITROSTAT) 0.4 MG SL tablet Place 1 tablet (0.4 mg total) under the tongue every 5 (five) minutes as needed for chest pain. 90 tablet 3   No current facility-administered medications for this visit.     Allergies:   Rofecoxib; Sulfamethoxazole-trimethoprim; Codeine; Estradiol; Flurbiprofen; and Ibuprofen    Social History:  The patient  reports that she quit smoking about 33 years ago. Her smoking use included cigarettes. She smoked 0.00 packs per day. She has never used smokeless tobacco. She reports that she drinks alcohol. She reports that she does not use drugs.   Family History:  The patient's family history includes Cancer in her sister; Heart attack in her father;  Hip fracture in her mother; Stroke in her brother.    ROS:  Please see the history of present illness.   Otherwise, review of systems are positive for none.   All other systems are reviewed and negative.    PHYSICAL EXAM: VS:  BP (!) 146/75   Pulse (!) 53   Ht 5\' 2"  (1.575 m)   Wt 97 lb 3.2 oz (44.1 kg)   BMI 17.78 kg/m  , BMI Body mass index is 17.78 kg/m. GEN: Well nourished, well developed WF, in no acute distress  HEENT: normal  Neck: no JVD, bilateral carotid bruits with diminished upstroke, no masses Cardiac:RRR; there is a harsh 2/6 systolic murmur heard best at the apex radiating to RUSB and into carotids. Respiratory:  clear to auscultation bilaterally, normal work of breathing GI: soft, nontender, nondistended, + BS, midline abdominal bruit.  MS: no deformity or atrophy  Skin: warm and dry, no rash Neuro:  Strength and sensation are intact Psych: euthymic mood, full affect   EKG:  EKG is not ordered today.   Recent Labs: 04/06/2017: ALT 14; BUN 20; Creatinine, Ser 1.16; Hemoglobin 13.2; Platelets 245; Potassium 4.4; Sodium 139; TSH 3.060    Lipid Panel    Component Value Date/Time   CHOL 165 04/06/2017 1420   TRIG 69 04/06/2017 1420   HDL 76 04/06/2017 1420   CHOLHDL 2.2 05/06/2008 0425   VLDL 9 05/06/2008 0425   LDLCALC 75 04/06/2017 1420      Wt Readings from Last 3 Encounters:  08/08/17 97 lb 3.2 oz (44.1 kg)  05/15/17 98 lb (44.5 kg)  04/06/17 98 lb 9.6 oz (44.7 kg)      Other studies Reviewed: Additional studies/ records that were reviewed today include:  Labs dated 09/27/15: cholesterol 192, triglycerides 91, HDL 93, LDL 100. Chemistries, TSH, Hgb normal.   Echo 04/18/17: Study Conclusions  - Left ventricle: The cavity size was normal. Wall thickness was   increased in a pattern of mild LVH. Septal bounce suggestive of   LBBB, otherwise normal wall motion. Systolic function was normal.   The estimated ejection fraction was in the range of 55%  to 60%.   Doppler parameters are consistent with abnormal left ventricular   relaxation (grade 1 diastolic dysfunction). - Aortic valve: Trileaflet; severely calcified leaflets. There was   mild stenosis. Mean gradient (S): 12 mm Hg. Peak gradient (S): 19   mm Hg. Valve area (VTI): 1.86 cm^2. - Mitral valve: Mildly calcified annulus. There was no significant   regurgitation. - Left atrium: The atrium was mildly dilated. - Right ventricle: The cavity size  was normal. Systolic function   was normal. - Tricuspid valve: Peak RV-RA gradient (S): 27 mm Hg. - Pulmonary arteries: PA peak pressure: 30 mm Hg (S). - Inferior vena cava: The vessel was normal in size. The   respirophasic diameter changes were in the normal range (= 50%),   consistent with normal central venous pressure. - Pericardium, extracardiac: A trivial pericardial effusion was   identified.  Impressions:  - Normal LV size with mild LV hypertrophy. EF 55-60% with septal   bounce suggestive of LBBB. Normal RV size and systolic function.   Mild aortic stenosis.    ASSESSMENT AND PLAN:  1.  Angina pectoris- symptoms have improved significantly with addition of beta blocker and amlodipine.  Given sl Ntg to use prn and instructed in its use. Bradycardia has improved with reduction in beta blocker dose and cessation of diltiazem.   2. Mild Aortic stenosis.  3. Paroxysmal Afib. Now on Toprol XL for rate control.  No recurrence since 2016. If Afib should recur would have to discuss anticoagulation  4. LBBB.    Current medicines are reviewed at length with the patient today.  The patient does not have concerns regarding medicines.  The following changes have been made:  See above  Labs/ tests ordered today include:   No orders of the defined types were placed in this encounter.    Disposition:   FU with me in 6 months.  Signed, Gina Martinique, MD  08/08/2017 1:57 PM    [facility] [address] [place], [place], [address] Phone [phone], Fax [phone]

## 2017-08-08 ENCOUNTER — Encounter: Payer: Self-pay | Admitting: Cardiology

## 2017-08-08 ENCOUNTER — Ambulatory Visit (INDEPENDENT_AMBULATORY_CARE_PROVIDER_SITE_OTHER): Payer: Medicare Other | Admitting: Cardiology

## 2017-08-08 VITALS — BP 146/75 | HR 53 | Ht 62.0 in | Wt 97.2 lb

## 2017-08-08 DIAGNOSIS — I480 Paroxysmal atrial fibrillation: Secondary | ICD-10-CM | POA: Diagnosis not present

## 2017-08-08 DIAGNOSIS — I209 Angina pectoris, unspecified: Secondary | ICD-10-CM | POA: Diagnosis not present

## 2017-08-08 DIAGNOSIS — I447 Left bundle-branch block, unspecified: Secondary | ICD-10-CM

## 2017-08-08 DIAGNOSIS — I200 Unstable angina: Secondary | ICD-10-CM

## 2017-08-08 DIAGNOSIS — I350 Nonrheumatic aortic (valve) stenosis: Secondary | ICD-10-CM

## 2017-08-08 NOTE — Patient Instructions (Signed)
Continue your current therapy  I will see you in 6 months.   

## 2017-09-17 ENCOUNTER — Telehealth: Payer: Self-pay | Admitting: Cardiology

## 2017-09-17 ENCOUNTER — Encounter: Payer: Self-pay | Admitting: *Deleted

## 2017-09-17 MED ORDER — METOPROLOL SUCCINATE ER 25 MG PO TB24: 12.5000 mg | ORAL_TABLET | Freq: Every day | ORAL | 1 refills | Status: DC

## 2017-09-17 NOTE — Telephone Encounter (Signed)
New Message    *STAT* If patient is at the pharmacy, call can be transferred to refill team.   1. Which medications need to be refilled? (please list name of each medication and dose if known) metoprolol succinate (TOPROL XL) 25 MG 24 hr tablet  2. Which pharmacy/location (including street and city if local pharmacy) is medication to be sent to? Take 12.5 mg by mouth daily  3. Do they need a 30 day or 90 day supply? [place]

## 2017-09-17 NOTE — Telephone Encounter (Signed)
Returned call to patient, advised medication refill sent today, receipt confirmed.  Patient verbalized understanding.

## 2017-09-17 NOTE — Telephone Encounter (Signed)
New Message       Pt c/o medication issue:  1. Name of Medication: Metoprolol 50 mg  2. How are you currently taking this medication (dosage and times per day)? Once a day  3. Are you having a reaction (difficulty breathing--STAT)? No   4. What is your medication issue? Patient needs a new Rx sent to [facility] for 25 mg. Patient only has 2

## 2017-09-21 ENCOUNTER — Telehealth: Payer: Self-pay | Admitting: Cardiology

## 2017-09-21 MED ORDER — METOPROLOL SUCCINATE ER 25 MG PO TB24: 25.0000 mg | ORAL_TABLET | Freq: Every day | ORAL | 6 refills | Status: DC

## 2017-09-21 NOTE — Telephone Encounter (Signed)
Returned call to patient.She stated she went to pick up her metoprolol 25 mg prescription.Stated directions were wrong.Stated she takes 25 mg daily.Advised I will send in correct directions.

## 2017-09-21 NOTE — Telephone Encounter (Signed)
New Message:   Pt c/o medication issue:  1. Name of Medication: metoprolol succinate (TOPROL XL) 25 MG 24 hr tablet  2. How are you currently taking this medication (dosage and times per day)? Take 0.5 tablets (12.5 mg total) by mouth daily.  3. Are you having a reaction (difficulty breathing--STAT)? No   4. What is your medication issue?  The patient states she believe the dosage is in incorrect

## 2017-10-25 ENCOUNTER — Telehealth: Payer: Self-pay | Admitting: Cardiology

## 2017-10-25 NOTE — Telephone Encounter (Signed)
Okay to use topical CBD. Please avoid systemic CBD unless discussed with MD.

## 2017-10-25 NOTE — Telephone Encounter (Signed)
Message sent to pharmacy for advice. °

## 2017-10-25 NOTE — Telephone Encounter (Signed)
Spoke to patient Gina Terry's recommendation given.

## 2017-10-25 NOTE — Telephone Encounter (Signed)
New Message::     Pt have been having hip,knee and lower leg pain. She wants to know if she can take a Topical  Cream CBD with her Atrial Fib and her other medicine?

## 2018-02-11 NOTE — Progress Notes (Signed)
Cardiology Office Note   Date:  02/14/2018   ID:  Gina Terry, Gina Terry 17-May-1921, MRN [id number]  PCP:  Lorne Skeens, MD  Cardiologist:   Sussie Minor Martinique, MD   Chief Complaint  Patient presents with  . Follow-up      History of Present Illness: Gina Terry is a 85 y.o. female who is seen for follow up of angina.  She was seen by me previously in 2013. She has a history of LBBB and paroxysmal Afib. She had a normal nuclear stress test in 2011. Echo in past apparently OK. She has been managed with rate control and ASA.   She presented in March 2019 with symptoms of progressive angina. She described chest pain in the mid sternal area radiating into her back and down both arms. Symptoms are relieved with rest. Symptoms have progressed to the point where she gets pain walking from one room to the next or brushing her teeth. She states she was no longer doing her grocery shopping because of this. Denied dyspnea or diaphoresis.  Her last episode of Afib occurred in November 2016 and converted spontaneously.  She still lives independently in home by herself. She has one daughter living in [place] and a son living in [place]. Not much support system here although she states a neighbor will help her out if needed.   Due to her advanced age an initial conservative approach was pursued. She was started on Toprol XL 50 mg daily and given sl Ntg to use prn. An Echo was ordered. We later had to reduce the Toprol to 25 mg daily due to bradycardia with HR in the 40s.   On follow up today she  is doing pretty well. She has infrequent chest pain now and hasn't had to use Ntg. She does note some sinking spells with low BP. This is better since she split the timing of her medication.     Past Medical History:  Diagnosis Date  . Arthritis   . GERD (gastroesophageal reflux disease)   . Hypertension, essential 04/19/2016  . Hypothyroid   . Left bundle branch block   . Osteoporosis   . Paroxysmal  atrial fibrillation [facility])     Past Surgical History:  Procedure Laterality Date  . APPENDECTOMY    . CARDIOVASCULAR STRESS TEST  03-24-2009   EF 78%  . CHOLECYSTECTOMY    . TONSILLECTOMY AND ADENOIDECTOMY    . US ECHOCARDIOGRAPHY  05-07-2008   Est EF 50-55%     Current Outpatient Medications  Medication Sig Dispense Refill  . acetaminophen (TYLENOL) 500 MG tablet Take 500 mg by mouth every 6 (six) hours as needed for mild pain.    Marland Kitchen amLODipine (NORVASC) 2.5 MG tablet Take 1 tablet (2.5 mg total) by mouth daily. 180 tablet 3  . aspirin 81 MG tablet Take 81 mg by mouth daily.      Marland Kitchen ibuprofen (ADVIL,MOTRIN) 200 MG tablet Take 200 mg by mouth every 6 (six) hours as needed.    Marland Kitchen levothyroxine (SYNTHROID, LEVOTHROID) 50 MCG tablet Take 50 mcg by mouth daily before breakfast.    . metoprolol succinate (TOPROL XL) 25 MG 24 hr tablet Take 1 tablet (25 mg total) by mouth daily. 30 tablet 6  . VITAMIN E PO Take 2,000 Units by mouth as directed.     . zolpidem (AMBIEN) 5 MG tablet Take 2.5 mg by mouth at bedtime.     . nitroGLYCERIN (NITROSTAT) 0.4 MG SL tablet Place  1 tablet (0.4 mg total) under the tongue every 5 (five) minutes as needed for chest pain. 90 tablet 3   No current facility-administered medications for this visit.     Allergies:   Rofecoxib; Sulfamethoxazole-trimethoprim; Codeine; Estradiol; Flurbiprofen; and Ibuprofen    Social History:  The patient  reports that she quit smoking about 34 years ago. Her smoking use included cigarettes. She smoked 0.00 packs per day. She has never used smokeless tobacco. She reports current alcohol use. She reports that she does not use drugs.   Family History:  The patient's family history includes Cancer in her sister; Heart attack in her father; Hip fracture in her mother; Stroke in her brother.    ROS:  Please see the history of present illness.   Otherwise, review of systems are positive for none.   All other systems are reviewed and  negative.    PHYSICAL EXAM: VS:  BP 122/62   Pulse (!) 57   Ht 5\' 2"  (1.575 m)   Wt 95 lb 6.4 oz (43.3 kg)   BMI 17.45 kg/m  , BMI Body mass index is 17.45 kg/m. GEN: Well nourished, well developed WF, in no acute distress  HEENT: normal  Neck: no JVD, bilateral carotid bruits with diminished upstroke, no masses Cardiac:RRR; there is a harsh 2/6 systolic murmur heard best at the apex radiating to RUSB and into carotids. Respiratory:  clear to auscultation bilaterally, normal work of breathing GI: soft, nontender, nondistended, + BS, midline abdominal bruit.  MS: no deformity or atrophy  Skin: warm and dry, no rash Neuro:  Strength and sensation are intact Psych: euthymic mood, full affect   EKG:  EKG is not ordered today. NSR with rate 57. LAD, septal infarct. Compared to prior LBBB resolved. I have personally reviewed and interpreted this study.    Recent Labs: 04/06/2017: ALT 14; BUN 20; Creatinine, Ser 1.16; Hemoglobin 13.2; Platelets 245; Potassium 4.4; Sodium 139; TSH 3.060    Lipid Panel    Component Value Date/Time   CHOL 165 04/06/2017 1420   TRIG 69 04/06/2017 1420   HDL 76 04/06/2017 1420   CHOLHDL 2.2 05/06/2008 0425   VLDL 9 05/06/2008 0425   LDLCALC 75 04/06/2017 1420      Wt Readings from Last 3 Encounters:  02/14/18 95 lb 6.4 oz (43.3 kg)  08/08/17 97 lb 3.2 oz (44.1 kg)  05/15/17 98 lb (44.5 kg)      Other studies Reviewed: Additional studies/ records that were reviewed today include:  Labs dated 09/27/15: cholesterol 192, triglycerides 91, HDL 93, LDL 100. Chemistries, TSH, Hgb normal.   Echo 04/18/17: Study Conclusions  - Left ventricle: The cavity size was normal. Wall thickness was   increased in a pattern of mild LVH. Septal bounce suggestive of   LBBB, otherwise normal wall motion. Systolic function was normal.   The estimated ejection fraction was in the range of 55% to 60%.   Doppler parameters are consistent with abnormal left  ventricular   relaxation (grade 1 diastolic dysfunction). - Aortic valve: Trileaflet; severely calcified leaflets. There was   mild stenosis. Mean gradient (S): 12 mm Hg. Peak gradient (S): 19   mm Hg. Valve area (VTI): 1.86 cm^2. - Mitral valve: Mildly calcified annulus. There was no significant   regurgitation. - Left atrium: The atrium was mildly dilated. - Right ventricle: The cavity size was normal. Systolic function   was normal. - Tricuspid valve: Peak RV-RA gradient (S): 27 mm Hg. - Pulmonary  arteries: PA peak pressure: 30 mm Hg (S). - Inferior vena cava: The vessel was normal in size. The   respirophasic diameter changes were in the normal range (= 50%),   consistent with normal central venous pressure. - Pericardium, extracardiac: A trivial pericardial effusion was   identified.  Impressions:  - Normal LV size with mild LV hypertrophy. EF 55-60% with septal   bounce suggestive of LBBB. Normal RV size and systolic function.   Mild aortic stenosis.    ASSESSMENT AND PLAN:  1.  Angina pectoris- symptoms have improved significantly with addition of beta blocker and amlodipine. Agree with taking these at different times to avoid low BP.  Given sl Ntg to use prn and instructed in its use. Bradycardia has improved with reduction in beta blocker dose.  2. Mild Aortic stenosis.  3. Paroxysmal Afib. Now on Toprol XL for rate control.  No recurrence since 2016. If Afib should recur would have to discuss anticoagulation  4. LBBB. Resolved today.   Current medicines are reviewed at length with the patient today.  The patient does not have concerns regarding medicines.  The following changes have been made:  See above  Labs/ tests ordered today include:   No orders of the defined types were placed in this encounter.    Disposition:   FU with me in one year  Signed, Jisela Merlino Martinique, MD  02/14/2018 11:23 AM    [facility] [address]  [place], [place], [address] Phone [phone], Fax [phone]

## 2018-02-14 ENCOUNTER — Encounter (INDEPENDENT_AMBULATORY_CARE_PROVIDER_SITE_OTHER): Payer: Self-pay

## 2018-02-14 ENCOUNTER — Encounter: Payer: Self-pay | Admitting: Cardiology

## 2018-02-14 ENCOUNTER — Ambulatory Visit (INDEPENDENT_AMBULATORY_CARE_PROVIDER_SITE_OTHER): Payer: Medicare Other | Admitting: Cardiology

## 2018-02-14 VITALS — BP 122/62 | HR 57 | Ht 62.0 in | Wt 95.4 lb

## 2018-02-14 DIAGNOSIS — I350 Nonrheumatic aortic (valve) stenosis: Secondary | ICD-10-CM | POA: Diagnosis not present

## 2018-02-14 DIAGNOSIS — I480 Paroxysmal atrial fibrillation: Secondary | ICD-10-CM

## 2018-02-14 DIAGNOSIS — I209 Angina pectoris, unspecified: Secondary | ICD-10-CM | POA: Diagnosis not present

## 2018-02-14 DIAGNOSIS — I447 Left bundle-branch block, unspecified: Secondary | ICD-10-CM

## 2018-04-23 ENCOUNTER — Other Ambulatory Visit: Payer: Self-pay | Admitting: *Deleted

## 2018-04-23 MED ORDER — METOPROLOL SUCCINATE ER 25 MG PO TB24: 25.0000 mg | ORAL_TABLET | Freq: Every day | ORAL | 3 refills | Status: DC

## 2018-05-27 ENCOUNTER — Other Ambulatory Visit: Payer: Self-pay

## 2018-05-27 MED ORDER — AMLODIPINE BESYLATE 2.5 MG PO TABS: 2.5000 mg | ORAL_TABLET | Freq: Every day | ORAL | 3 refills | Status: DC

## 2018-08-19 ENCOUNTER — Other Ambulatory Visit: Payer: Self-pay

## 2018-08-19 ENCOUNTER — Encounter: Payer: Self-pay | Admitting: Podiatry

## 2018-08-19 ENCOUNTER — Ambulatory Visit (INDEPENDENT_AMBULATORY_CARE_PROVIDER_SITE_OTHER): Payer: Medicare Other | Admitting: Podiatry

## 2018-08-19 VITALS — Temp 98.9°F

## 2018-08-19 DIAGNOSIS — L03031 Cellulitis of right toe: Secondary | ICD-10-CM

## 2018-08-19 DIAGNOSIS — I209 Angina pectoris, unspecified: Secondary | ICD-10-CM | POA: Diagnosis not present

## 2018-08-19 NOTE — Patient Instructions (Signed)
Soak Instructions    THE DAY AFTER THE PROCEDURE  Place 1/4 cup of epsom salts in a quart of warm tap water.  Submerge your foot or feet with outer bandage intact for the initial soak; this will allow the bandage to become moist and wet for easy lift off.  Once you remove your bandage, continue to soak in the solution for 20 minutes.  This soak should be done twice a day.  Next, remove your foot or feet from solution, blot dry the affected area and cover.  You may use a band aid large enough to cover the area or use gauze and tape.  Apply other medications to the area as directed by the doctor such as polysporin neosporin.  IF YOUR SKIN BECOMES IRRITATED WHILE USING THESE INSTRUCTIONS, IT IS OKAY TO SWITCH TO  [place] VINEGAR AND WATER. Or you may use antibacterial soap and water to keep the toe clean  Monitor for any signs/symptoms of infection. Call the office immediately if any occur or go directly to the emergency room. Call with any questions/concerns.    Long Term Care Instructions-Post Nail Surgery  You have had your ingrown toenail and root treated with a chemical.  This chemical causes a burn that will drain and ooze like a blister.  This can drain for 6-8 weeks or longer.  It is important to keep this area clean, covered, and follow the soaking instructions dispensed at the time of your surgery.  This area will eventually dry and form a scab.  Once the scab forms you no longer need to soak or apply a dressing.  If at any time you experience an increase in pain, redness, swelling, or drainage, you should contact the office as soon as possible.  

## 2018-08-20 NOTE — Progress Notes (Signed)
Subjective:   Patient ID: Gina Terry, female   DOB: 85 y.o.   MRN: [id number]   HPI Patient states the border of my nail has been red and I am getting drainage and it is like it was a couple years ago.  States it is been sore   ROS      Objective:  Physical Exam  Neurovascular unchanged from previous visit with patient's left hallux medial border showing redness and localized drainage with no proximal edema erythema drainage noted     Assessment:  Paronychia infection of the left hallux that is present and appears to be localized     Plan:  H&P condition reviewed and recommended removal of the border.  I explained procedure and today infiltrated 60 mg like Marcaine mixture sterile prep applied and using sterile instrumentation I remove the medial border of flushed the area out and did not note anything to culture currently remove necrotic tissue applied sterile dressing instructed on soaks and reappoint to recheck

## 2018-09-02 ENCOUNTER — Telehealth: Payer: Self-pay | Admitting: *Deleted

## 2018-09-02 NOTE — Telephone Encounter (Signed)
I called pt and she states she has been soaking twice and it is getting difficult for an old lady. Pt asked if she could go to once daily. I told pt I felt she could try once daily for a week allowing the toe to air dry when resting and not in a shoe, I told pt if the area got a dry hard scab without redness, swelling or discomfort she could stop the soaks. I asked pt if she would like to set up the follow up appt Dr. Paulla Dolly had requested. Pt states she will have to call back.

## 2018-09-02 NOTE — Telephone Encounter (Signed)
Pt states she has some questions about her toe procedure.

## 2018-09-09 ENCOUNTER — Ambulatory Visit (INDEPENDENT_AMBULATORY_CARE_PROVIDER_SITE_OTHER): Payer: Medicare Other | Admitting: Podiatry

## 2018-09-09 ENCOUNTER — Encounter: Payer: Self-pay | Admitting: Podiatry

## 2018-09-09 ENCOUNTER — Other Ambulatory Visit: Payer: Self-pay

## 2018-09-09 VITALS — Temp 97.3°F

## 2018-09-09 DIAGNOSIS — I209 Angina pectoris, unspecified: Secondary | ICD-10-CM

## 2018-09-09 DIAGNOSIS — L03031 Cellulitis of right toe: Secondary | ICD-10-CM | POA: Diagnosis not present

## 2018-09-10 NOTE — Progress Notes (Signed)
Subjective:   Patient ID: Gina Terry, female   DOB: 85 y.o.   MRN: [id number]   HPI Patient states nail feels fine with no current discomfort slight redness that she wanted checked   ROS      Objective:  Physical Exam  Neurovascular status intact with patient's right hallux showing good healing with mild localized erythema but no drainage or proximal edema erythema drainage noted     Assessment:  Doing well from paronychia infection right hallux with healing occurring     Plan:  Discussed consideration of continued soaks and bandage usage and educated her on things to watch out for and reappoint as needed

## 2018-09-17 ENCOUNTER — Telehealth: Payer: Self-pay | Admitting: Cardiology

## 2018-09-17 NOTE — Telephone Encounter (Signed)
  Patient is calling because she has been having a clammy feeling and perspiring more than usual. She also states she is having some fatigue and her BP has been running a little high for her at times and other times real low. This morning her BP 98/51 and she states she felt really bad but it went back up a bit ago.

## 2018-09-17 NOTE — Telephone Encounter (Signed)
If symptoms do not correspond to BP change then I think initial evaluation with primary is appropriate. Also check and make sure HR is not too slow.  Rasaan Brotherton Martinique MD, [facility]

## 2018-09-17 NOTE — Telephone Encounter (Signed)
Advised patient, verbalized understanding  

## 2018-09-17 NOTE — Telephone Encounter (Signed)
Feels clammy/sweaty at times, several times a day. Has been going couple of weeks or so. She denies any fever, sore throat, or cough. Feeling fatigue and not having a lot of energy. She does not have any lightheadedness, dizziness, or chest pain. She has appointment with PCP next for yearly physical. Her blood pressure this am was 98/59 after her morning medications and she was feeling "droopy" about an hour ago 134/68. She can not associate her clamminess with low blood pressures. Advised to keep her visit next week with PCP and will forward to Dr Martinique for review

## 2018-09-23 DIAGNOSIS — R634 Abnormal weight loss: Secondary | ICD-10-CM | POA: Insufficient documentation

## 2019-01-24 ENCOUNTER — Inpatient Hospital Stay (HOSPITAL_COMMUNITY)
Admission: EM | Admit: 2019-01-24 | Discharge: 2019-01-29 | DRG: 391 | Disposition: A | Discharge status: Home Health | Payer: Medicare Other | Attending: Internal Medicine | Admitting: Internal Medicine

## 2019-01-24 ENCOUNTER — Encounter (HOSPITAL_COMMUNITY): Payer: Self-pay | Admitting: Emergency Medicine

## 2019-01-24 DIAGNOSIS — I10 Essential (primary) hypertension: Secondary | ICD-10-CM | POA: Diagnosis present

## 2019-01-24 DIAGNOSIS — M810 Age-related osteoporosis without current pathological fracture: Secondary | ICD-10-CM | POA: Diagnosis present

## 2019-01-24 DIAGNOSIS — A09 Infectious gastroenteritis and colitis, unspecified: Secondary | ICD-10-CM | POA: Diagnosis not present

## 2019-01-24 DIAGNOSIS — I480 Paroxysmal atrial fibrillation: Secondary | ICD-10-CM | POA: Diagnosis present

## 2019-01-24 DIAGNOSIS — Z79899 Other long term (current) drug therapy: Secondary | ICD-10-CM

## 2019-01-24 DIAGNOSIS — K5909 Other constipation: Secondary | ICD-10-CM | POA: Diagnosis present

## 2019-01-24 DIAGNOSIS — I700 Atherosclerosis of aorta: Secondary | ICD-10-CM | POA: Diagnosis present

## 2019-01-24 DIAGNOSIS — I447 Left bundle-branch block, unspecified: Secondary | ICD-10-CM | POA: Diagnosis present

## 2019-01-24 DIAGNOSIS — K2950 Unspecified chronic gastritis without bleeding: Secondary | ICD-10-CM | POA: Diagnosis present

## 2019-01-24 DIAGNOSIS — Z885 Allergy status to narcotic agent status: Secondary | ICD-10-CM

## 2019-01-24 DIAGNOSIS — Z87891 Personal history of nicotine dependence: Secondary | ICD-10-CM

## 2019-01-24 DIAGNOSIS — K589 Irritable bowel syndrome without diarrhea: Secondary | ICD-10-CM | POA: Diagnosis present

## 2019-01-24 DIAGNOSIS — K219 Gastro-esophageal reflux disease without esophagitis: Secondary | ICD-10-CM | POA: Diagnosis present

## 2019-01-24 DIAGNOSIS — Z9049 Acquired absence of other specified parts of digestive tract: Secondary | ICD-10-CM

## 2019-01-24 DIAGNOSIS — K529 Noninfective gastroenteritis and colitis, unspecified: Secondary | ICD-10-CM

## 2019-01-24 DIAGNOSIS — Z7289 Other problems related to lifestyle: Secondary | ICD-10-CM

## 2019-01-24 DIAGNOSIS — Z886 Allergy status to analgesic agent status: Secondary | ICD-10-CM

## 2019-01-24 DIAGNOSIS — J189 Pneumonia, unspecified organism: Secondary | ICD-10-CM | POA: Diagnosis present

## 2019-01-24 DIAGNOSIS — R0602 Shortness of breath: Secondary | ICD-10-CM

## 2019-01-24 DIAGNOSIS — G8929 Other chronic pain: Secondary | ICD-10-CM | POA: Diagnosis present

## 2019-01-24 DIAGNOSIS — I119 Hypertensive heart disease without heart failure: Secondary | ICD-10-CM | POA: Diagnosis present

## 2019-01-24 DIAGNOSIS — Z20828 Contact with and (suspected) exposure to other viral communicable diseases: Secondary | ICD-10-CM | POA: Diagnosis present

## 2019-01-24 DIAGNOSIS — Z8249 Family history of ischemic heart disease and other diseases of the circulatory system: Secondary | ICD-10-CM

## 2019-01-24 DIAGNOSIS — E039 Hypothyroidism, unspecified: Secondary | ICD-10-CM | POA: Diagnosis present

## 2019-01-24 DIAGNOSIS — Z7982 Long term (current) use of aspirin: Secondary | ICD-10-CM

## 2019-01-24 DIAGNOSIS — Z66 Do not resuscitate: Secondary | ICD-10-CM | POA: Diagnosis present

## 2019-01-24 DIAGNOSIS — K5730 Diverticulosis of large intestine without perforation or abscess without bleeding: Secondary | ICD-10-CM | POA: Diagnosis present

## 2019-01-24 DIAGNOSIS — Z79890 Hormone replacement therapy (postmenopausal): Secondary | ICD-10-CM

## 2019-01-24 DIAGNOSIS — Z888 Allergy status to other drugs, medicaments and biological substances status: Secondary | ICD-10-CM

## 2019-01-24 LAB — CBC WITH DIFFERENTIAL/PLATELET
Abs Immature Granulocytes: 0.26 10*3/uL — ABNORMAL HIGH (ref 0.00–0.07)
Basophils Absolute: 0 10*3/uL (ref 0.0–0.1)
Basophils Relative: 0 %
Eosinophils Absolute: 0.1 10*3/uL (ref 0.0–0.5)
Eosinophils Relative: 0 %
HCT: 47.1 % — ABNORMAL HIGH (ref 36.0–46.0)
Hemoglobin: 15.5 g/dL — ABNORMAL HIGH (ref 12.0–15.0)
Immature Granulocytes: 1 %
Lymphocytes Relative: 4 %
Lymphs Abs: 0.9 10*3/uL (ref 0.7–4.0)
MCH: 29.8 pg (ref 26.0–34.0)
MCHC: 32.9 g/dL (ref 30.0–36.0)
MCV: 90.6 fL (ref 80.0–100.0)
Monocytes Absolute: 4.1 10*3/uL — ABNORMAL HIGH (ref 0.1–1.0)
Monocytes Relative: 20 %
Neutro Abs: 15 10*3/uL — ABNORMAL HIGH (ref 1.7–7.7)
Neutrophils Relative %: 75 %
Platelets: 239 10*3/uL (ref 150–400)
RBC: 5.2 MIL/uL — ABNORMAL HIGH (ref 3.87–5.11)
RDW: 14.3 % (ref 11.5–15.5)
WBC: 20.4 10*3/uL — ABNORMAL HIGH (ref 4.0–10.5)
nRBC: 0 % (ref 0.0–0.2)

## 2019-01-24 LAB — COMPREHENSIVE METABOLIC PANEL
ALT: 20 U/L (ref 0–44)
AST: 26 U/L (ref 15–41)
Albumin: 4 g/dL (ref 3.5–5.0)
Alkaline Phosphatase: 59 U/L (ref 38–126)
Anion gap: 14 (ref 5–15)
BUN: 22 mg/dL (ref 8–23)
CO2: 21 mmol/L — ABNORMAL LOW (ref 22–32)
Calcium: 9.9 mg/dL (ref 8.9–10.3)
Chloride: 98 mmol/L (ref 98–111)
Creatinine, Ser: 1.14 mg/dL — ABNORMAL HIGH (ref 0.44–1.00)
GFR calc Af Amer: 47 mL/min — ABNORMAL LOW (ref >60)
GFR calc non Af Amer: 41 mL/min — ABNORMAL LOW (ref >60)
Glucose, Bld: 111 mg/dL — ABNORMAL HIGH (ref 70–99)
Potassium: 4.1 mmol/L (ref 3.5–5.1)
Sodium: 133 mmol/L — ABNORMAL LOW (ref 135–145)
Total Bilirubin: 0.9 mg/dL (ref 0.3–1.2)
Total Protein: 6.9 g/dL (ref 6.5–8.1)

## 2019-01-24 LAB — LACTIC ACID, PLASMA: Lactic Acid, Venous: 1.6 mmol/L (ref 0.5–1.9)

## 2019-01-24 MED ORDER — SODIUM CHLORIDE 0.9 % IV BOLUS
500.0000 mL | Freq: Once | INTRAVENOUS | Status: AC
  Administered 2019-01-24: 500 mL via INTRAVENOUS

## 2019-01-24 MED ORDER — FENTANYL CITRATE (PF) 100 MCG/2ML IJ SOLN
25.0000 ug | Freq: Once | INTRAMUSCULAR | Status: AC
  Administered 2019-01-24: 22:00:00 25 ug via INTRAVENOUS
  Filled 2019-01-24: qty 2

## 2019-01-24 MED ORDER — IOHEXOL 9 MG/ML PO SOLN
ORAL | Status: AC
  Filled 2019-01-24: qty 500

## 2019-01-24 NOTE — ED Triage Notes (Signed)
Pt comes to ed via ems, c/o abdominal pain and constipation, alert x 4, bed bound, v/s on arrival 150/70, pluse 80, rr18, temp 97.9,spo2 97 room air.  Family wanted her checked out.

## 2019-01-24 NOTE — ED Notes (Signed)
Pt's daughter called and provided two phone numbers for update when nurse is able to do so. Cellphone [phone] and Pt's home # [phone].

## 2019-01-24 NOTE — ED Provider Notes (Signed)
[place] DEPT Provider Note   CSN: [id number] Arrival date & time: 01/24/19  2033     History Chief Complaint  Patient presents with  . Abdominal Pain  . Constipation    Gina Terry is a 85 y.o. female.  The history is provided by the patient and medical records. No language interpreter was used.   Gina Terry is a 85 y.o. female who presents to the Emergency Department complaining of abdominal pain.  She presents to the ED complaining of abdominal pain and constipation. She has a history of chronic constipation and her last BM was three days ago. Today she developed generalized abdominal cramping and was able to have a small amount of stool past. She also developed nausea, dry heaves and malaise. She currently lives at home but does have caregivers that assist her with activities. She also complains of chronic back pain and difficulty on ambulation. She denies any fevers, shortness of breath, dysuria. No prior similar symptoms.    Past Medical History:  Diagnosis Date  . Arthritis   . GERD (gastroesophageal reflux disease)   . Hypertension, essential 04/19/2016  . Hypothyroid   . Left bundle branch block   . Osteoporosis   . Paroxysmal atrial fibrillation [facility])     Patient Active Problem List   Diagnosis Date Noted  . Chronic bilateral low back pain 04/19/2016  . Gastroesophageal reflux 04/19/2016  . Hypertension, essential 04/19/2016  . 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 Primary hypothyroidism 04/19/2016  . Paroxysmal atrial fibrillation (HCC)   . Left bundle branch block   . OTHER CONSTIPATION 07/22/2007  . ABDOMINAL PAIN, EPIGASTRIC 07/22/2007  . GASTRITIS, CHRONIC 05/14/2007  . DIVERTICULOSIS, COLON 05/14/2007    Past Surgical History:  Procedure Laterality Date  . APPENDECTOMY    . CARDIOVASCULAR STRESS TEST  03-24-2009   EF 78%  . CHOLECYSTECTOMY    . TONSILLECTOMY AND ADENOIDECTOMY    . US ECHOCARDIOGRAPHY   05-07-2008   Est EF 50-55%     OB History   No obstetric history on file.     Family History  Problem Relation Age of Onset  . Hip fracture Mother   . Heart attack Father   . Cancer Sister   . Stroke Brother     Social History   Tobacco Use  . Smoking status: Former Smoker    Packs/day: 0.00    Types: Cigarettes    Quit date: 01/31/1984    Years since quitting: 35.0  . Smokeless tobacco: Never Used  Substance Use Topics  . Alcohol use: Yes    Comment: 3 oz of wine before dinner every night  . Drug use: No    Home Medications Prior to Admission medications   Medication Sig Start Date End Date Taking? Authorizing Provider  acetaminophen (TYLENOL) 500 MG tablet Take 500 mg by mouth every 6 (six) hours as needed for mild pain.    [provider]  amLODipine (NORVASC) 2.5 MG tablet Take 1 tablet (2.5 mg total) by mouth daily. 05/27/18 05/16/20  Martinique, Peter M, MD  aspirin 81 MG tablet Take 81 mg by mouth daily.      [provider]  ibuprofen (ADVIL,MOTRIN) 200 MG tablet Take 200 mg by mouth every 6 (six) hours as needed.    [provider]  levothyroxine (SYNTHROID, LEVOTHROID) 50 MCG tablet Take 50 mcg by mouth daily before breakfast.    [provider]  metoprolol succinate (TOPROL XL) 25 MG 24  hr tablet Take 1 tablet (25 mg total) by mouth daily. 04/23/18   Martinique, Peter M, MD  nitroGLYCERIN (NITROSTAT) 0.4 MG SL tablet Place 1 tablet (0.4 mg total) under the tongue every 5 (five) minutes as needed for chest pain. 04/06/17 07/05/17  Martinique, Peter M, MD  VITAMIN E PO Take 2,000 Units by mouth as directed.     [provider]  zolpidem (AMBIEN) 5 MG tablet Take 2.5 mg by mouth at bedtime.     [provider]    Allergies    Rofecoxib, Sulfamethoxazole-trimethoprim, Codeine, Estradiol, Flurbiprofen, and Ibuprofen  Review of Systems   Review of Systems  All other systems reviewed and are negative.   Physical Exam Updated  Vital Signs BP (!) 160/67   Pulse 64   Temp (!) 97.5 F (36.4 C)   Resp 16   SpO2 95%   Physical Exam Vitals and nursing note reviewed.  Constitutional:      Appearance: She is well-developed.  HENT:     Head: Normocephalic and atraumatic.  Cardiovascular:     Rate and Rhythm: Normal rate and regular rhythm.     Heart sounds: No murmur.  Pulmonary:     Effort: Pulmonary effort is normal. No respiratory distress.     Breath sounds: Normal breath sounds.  Abdominal:     Palpations: Abdomen is soft.     Tenderness: There is no guarding or rebound.     Comments: Severe generalized abdominal tenderness.    Musculoskeletal:        General: No swelling or tenderness.  Skin:    General: Skin is warm and dry.  Neurological:     Mental Status: She is alert and oriented to person, place, and time.  Psychiatric:        Behavior: Behavior normal.     ED Results / Procedures / Treatments   Labs (all labs ordered are listed, but only abnormal results are displayed) Labs Reviewed  COMPREHENSIVE METABOLIC PANEL - Abnormal; Notable for the following components:      Result Value   Sodium 133 (*)    CO2 21 (*)    Glucose, Bld 111 (*)    Creatinine, Ser 1.14 (*)    GFR calc non Af Amer 41 (*)    GFR calc Af Amer 47 (*)    All other components within normal limits  CBC WITH DIFFERENTIAL/PLATELET - Abnormal; Notable for the following components:   WBC 20.4 (*)    RBC 5.20 (*)    Hemoglobin 15.5 (*)    HCT 47.1 (*)    Neutro Abs 15.0 (*)    Monocytes Absolute 4.1 (*)    Abs Immature Granulocytes 0.26 (*)    All other components within normal limits  LACTIC ACID, PLASMA  LACTIC ACID, PLASMA  URINALYSIS, ROUTINE W REFLEX MICROSCOPIC    EKG EKG Interpretation  Date/Time:  Friday January 24 2019 21:49:52 EST Ventricular Rate:  68 PR Interval:    QRS Duration: 103 QT Interval:  394 QTC Calculation: 419 R Axis:   -58 Text Interpretation: Sinus rhythm Left anterior  fascicular block LVH with secondary repolarization abnormality Anterior infarct, old Confirmed by Quintella Reichert [phone]) on 01/24/2019 9:55:45 PM   Radiology No results found.  Procedures Procedures (including critical care time)  Medications Ordered in ED Medications  iohexol (OMNIPAQUE) 9 MG/ML oral solution (has no administration in time range)  fentaNYL (SUBLIMAZE) injection 25 mcg (25 mcg Intravenous Given 01/24/19 2156)  sodium chloride  0.9 % bolus 500 mL (0 mLs Intravenous Stopped 01/24/19 2323)    ED Course  I have reviewed the triage vital signs and the nursing notes.  Pertinent labs & imaging results that were available during my care of the patient were reviewed by me and considered in my medical decision making (see chart for details).    MDM Rules/Calculators/A&P                     Patient here for evaluation of generalized abdominal pain, constipation. She does have significant tenderness on examination. Plan to treat with IV fluids, pain medications and obtain CT abdomen pelvis to further evaluate. Patient care transferred pending CT abdomen pelvis, UA.  Final Clinical Impression(s) / ED Diagnoses Final diagnoses:  None    Rx / DC Orders ED Discharge Orders    None       Quintella Reichert, MD 01/25/19 0008

## 2019-01-25 ENCOUNTER — Emergency Department (HOSPITAL_COMMUNITY): Payer: Medicare Other

## 2019-01-25 ENCOUNTER — Encounter (HOSPITAL_COMMUNITY): Payer: Self-pay

## 2019-01-25 ENCOUNTER — Other Ambulatory Visit: Payer: Self-pay

## 2019-01-25 ENCOUNTER — Inpatient Hospital Stay (HOSPITAL_COMMUNITY): Payer: Medicare Other

## 2019-01-25 DIAGNOSIS — I119 Hypertensive heart disease without heart failure: Secondary | ICD-10-CM | POA: Diagnosis present

## 2019-01-25 DIAGNOSIS — Z87891 Personal history of nicotine dependence: Secondary | ICD-10-CM | POA: Diagnosis not present

## 2019-01-25 DIAGNOSIS — I700 Atherosclerosis of aorta: Secondary | ICD-10-CM | POA: Diagnosis present

## 2019-01-25 DIAGNOSIS — Z886 Allergy status to analgesic agent status: Secondary | ICD-10-CM | POA: Diagnosis not present

## 2019-01-25 DIAGNOSIS — Z79899 Other long term (current) drug therapy: Secondary | ICD-10-CM | POA: Diagnosis not present

## 2019-01-25 DIAGNOSIS — J189 Pneumonia, unspecified organism: Secondary | ICD-10-CM | POA: Diagnosis present

## 2019-01-25 DIAGNOSIS — K529 Noninfective gastroenteritis and colitis, unspecified: Secondary | ICD-10-CM | POA: Diagnosis not present

## 2019-01-25 DIAGNOSIS — Z888 Allergy status to other drugs, medicaments and biological substances status: Secondary | ICD-10-CM | POA: Diagnosis not present

## 2019-01-25 DIAGNOSIS — I480 Paroxysmal atrial fibrillation: Secondary | ICD-10-CM | POA: Diagnosis present

## 2019-01-25 DIAGNOSIS — Z885 Allergy status to narcotic agent status: Secondary | ICD-10-CM | POA: Diagnosis not present

## 2019-01-25 DIAGNOSIS — K219 Gastro-esophageal reflux disease without esophagitis: Secondary | ICD-10-CM | POA: Diagnosis present

## 2019-01-25 DIAGNOSIS — G8929 Other chronic pain: Secondary | ICD-10-CM | POA: Diagnosis present

## 2019-01-25 DIAGNOSIS — K5909 Other constipation: Secondary | ICD-10-CM | POA: Diagnosis present

## 2019-01-25 DIAGNOSIS — Z7982 Long term (current) use of aspirin: Secondary | ICD-10-CM | POA: Diagnosis not present

## 2019-01-25 DIAGNOSIS — E039 Hypothyroidism, unspecified: Secondary | ICD-10-CM | POA: Diagnosis present

## 2019-01-25 DIAGNOSIS — I10 Essential (primary) hypertension: Secondary | ICD-10-CM | POA: Diagnosis not present

## 2019-01-25 DIAGNOSIS — Z66 Do not resuscitate: Secondary | ICD-10-CM | POA: Diagnosis present

## 2019-01-25 DIAGNOSIS — I447 Left bundle-branch block, unspecified: Secondary | ICD-10-CM | POA: Diagnosis present

## 2019-01-25 DIAGNOSIS — Z7289 Other problems related to lifestyle: Secondary | ICD-10-CM | POA: Diagnosis not present

## 2019-01-25 DIAGNOSIS — K2950 Unspecified chronic gastritis without bleeding: Secondary | ICD-10-CM | POA: Diagnosis present

## 2019-01-25 DIAGNOSIS — Z20828 Contact with and (suspected) exposure to other viral communicable diseases: Secondary | ICD-10-CM | POA: Diagnosis present

## 2019-01-25 DIAGNOSIS — M810 Age-related osteoporosis without current pathological fracture: Secondary | ICD-10-CM | POA: Diagnosis present

## 2019-01-25 DIAGNOSIS — R0602 Shortness of breath: Secondary | ICD-10-CM | POA: Diagnosis not present

## 2019-01-25 DIAGNOSIS — A09 Infectious gastroenteritis and colitis, unspecified: Secondary | ICD-10-CM | POA: Diagnosis present

## 2019-01-25 DIAGNOSIS — Z9049 Acquired absence of other specified parts of digestive tract: Secondary | ICD-10-CM | POA: Diagnosis not present

## 2019-01-25 DIAGNOSIS — K5730 Diverticulosis of large intestine without perforation or abscess without bleeding: Secondary | ICD-10-CM | POA: Diagnosis present

## 2019-01-25 DIAGNOSIS — K589 Irritable bowel syndrome without diarrhea: Secondary | ICD-10-CM | POA: Diagnosis present

## 2019-01-25 LAB — CBC WITH DIFFERENTIAL/PLATELET
Abs Immature Granulocytes: 0.89 10*3/uL — ABNORMAL HIGH (ref 0.00–0.07)
Basophils Absolute: 0.1 10*3/uL (ref 0.0–0.1)
Basophils Relative: 0 %
Eosinophils Absolute: 0 10*3/uL (ref 0.0–0.5)
Eosinophils Relative: 0 %
HCT: 40.2 % (ref 36.0–46.0)
Hemoglobin: 12.8 g/dL (ref 12.0–15.0)
Immature Granulocytes: 3 %
Lymphocytes Relative: 2 %
Lymphs Abs: 0.5 10*3/uL — ABNORMAL LOW (ref 0.7–4.0)
MCH: 30 pg (ref 26.0–34.0)
MCHC: 31.8 g/dL (ref 30.0–36.0)
MCV: 94.1 fL (ref 80.0–100.0)
Monocytes Absolute: 10.9 10*3/uL — ABNORMAL HIGH (ref 0.1–1.0)
Monocytes Relative: 31 %
Neutro Abs: 23.3 10*3/uL — ABNORMAL HIGH (ref 1.7–7.7)
Neutrophils Relative %: 64 %
Platelets: 206 10*3/uL (ref 150–400)
RBC: 4.27 MIL/uL (ref 3.87–5.11)
RDW: 14.7 % (ref 11.5–15.5)
WBC: 35.7 10*3/uL — ABNORMAL HIGH (ref 4.0–10.5)
nRBC: 0 % (ref 0.0–0.2)

## 2019-01-25 LAB — SARS CORONAVIRUS 2 (TAT 6-24 HRS): SARS Coronavirus 2: NEGATIVE

## 2019-01-25 MED ORDER — MORPHINE SULFATE (PF) 4 MG/ML IV SOLN
4.0000 mg | Freq: Once | INTRAVENOUS | Status: AC
  Administered 2019-01-25: 02:00:00 4 mg via INTRAVENOUS
  Filled 2019-01-25: qty 1

## 2019-01-25 MED ORDER — POLYETHYLENE GLYCOL 3350 17 G PO PACK: 17.0000 g | PACK | Freq: Every day | ORAL | Status: DC

## 2019-01-25 MED ORDER — METOPROLOL SUCCINATE ER 25 MG PO TB24
25.0000 mg | ORAL_TABLET | Freq: Every day | ORAL | Status: DC
  Administered 2019-01-25 – 2019-01-29 (×5): 25 mg via ORAL
  Filled 2019-01-25 (×5): qty 1

## 2019-01-25 MED ORDER — CIPROFLOXACIN IN D5W 400 MG/200ML IV SOLN
400.0000 mg | Freq: Once | INTRAVENOUS | Status: AC
  Administered 2019-01-25: 400 mg via INTRAVENOUS
  Filled 2019-01-25: qty 200

## 2019-01-25 MED ORDER — AMLODIPINE BESYLATE 5 MG PO TABS
2.5000 mg | ORAL_TABLET | Freq: Every day | ORAL | Status: DC
  Administered 2019-01-25 – 2019-01-29 (×5): 2.5 mg via ORAL
  Filled 2019-01-25 (×5): qty 1

## 2019-01-25 MED ORDER — ASPIRIN EC 81 MG PO TBEC
81.0000 mg | DELAYED_RELEASE_TABLET | Freq: Every day | ORAL | Status: DC
  Administered 2019-01-25 – 2019-01-29 (×5): 81 mg via ORAL
  Filled 2019-01-25 (×5): qty 1

## 2019-01-25 MED ORDER — SODIUM CHLORIDE 0.9 % IV SOLN: INTRAVENOUS | Status: AC

## 2019-01-25 MED ORDER — PIPERACILLIN-TAZOBACTAM IN DEX 2-0.25 GM/50ML IV SOLN
2.2500 g | Freq: Three times a day (TID) | INTRAVENOUS | Status: DC
  Administered 2019-01-25 – 2019-01-29 (×12): 2.25 g via INTRAVENOUS
  Filled 2019-01-25 (×13): qty 50

## 2019-01-25 MED ORDER — HYDROMORPHONE HCL 1 MG/ML IJ SOLN
0.5000 mg | INTRAMUSCULAR | Status: DC | PRN
  Administered 2019-01-25 (×2): 0.5 mg via INTRAVENOUS
  Filled 2019-01-25: qty 0.5
  Filled 2019-01-25 (×2): qty 1

## 2019-01-25 MED ORDER — ONDANSETRON HCL 4 MG/2ML IJ SOLN: 4.0000 mg | Freq: Four times a day (QID) | INTRAMUSCULAR | Status: DC | PRN

## 2019-01-25 MED ORDER — ENOXAPARIN SODIUM 30 MG/0.3ML ~~LOC~~ SOLN
30.0000 mg | Freq: Every day | SUBCUTANEOUS | Status: DC
  Administered 2019-01-26 – 2019-01-28 (×3): 30 mg via SUBCUTANEOUS
  Filled 2019-01-25 (×4): qty 0.3

## 2019-01-25 MED ORDER — LEVOTHYROXINE SODIUM 50 MCG PO TABS
50.0000 ug | ORAL_TABLET | Freq: Every day | ORAL | Status: DC
  Administered 2019-01-26 – 2019-01-29 (×4): 50 ug via ORAL
  Filled 2019-01-25 (×4): qty 1

## 2019-01-25 MED ORDER — ZOLPIDEM TARTRATE 5 MG PO TABS
2.5000 mg | ORAL_TABLET | Freq: Every day | ORAL | Status: DC
  Administered 2019-01-26 – 2019-01-28 (×3): 2.5 mg via ORAL
  Filled 2019-01-25 (×4): qty 1

## 2019-01-25 MED ORDER — CIPROFLOXACIN IN D5W 400 MG/200ML IV SOLN: 400.0000 mg | Freq: Every day | INTRAVENOUS | Status: DC

## 2019-01-25 MED ORDER — IOHEXOL 300 MG/ML  SOLN
75.0000 mL | Freq: Once | INTRAMUSCULAR | Status: AC | PRN
  Administered 2019-01-25: 75 mL via INTRAVENOUS

## 2019-01-25 MED ORDER — ACETAMINOPHEN 325 MG PO TABS
650.0000 mg | ORAL_TABLET | Freq: Four times a day (QID) | ORAL | Status: DC | PRN
  Administered 2019-01-25: 20:00:00 650 mg via ORAL
  Administered 2019-01-26: 22:00:00 325 mg via ORAL
  Filled 2019-01-25 (×2): qty 2

## 2019-01-25 MED ORDER — METRONIDAZOLE IN NACL 5-0.79 MG/ML-% IV SOLN
500.0000 mg | Freq: Once | INTRAVENOUS | Status: AC
  Administered 2019-01-25: 02:00:00 500 mg via INTRAVENOUS
  Filled 2019-01-25: qty 100

## 2019-01-25 MED ORDER — METRONIDAZOLE IN NACL 5-0.79 MG/ML-% IV SOLN
500.0000 mg | Freq: Three times a day (TID) | INTRAVENOUS | Status: DC
  Administered 2019-01-25: 500 mg via INTRAVENOUS
  Filled 2019-01-25: qty 100

## 2019-01-25 MED ORDER — ONDANSETRON HCL 4 MG/2ML IJ SOLN
4.0000 mg | Freq: Once | INTRAMUSCULAR | Status: AC
  Administered 2019-01-25: 4 mg via INTRAVENOUS
  Filled 2019-01-25: qty 2

## 2019-01-25 MED ORDER — ACETAMINOPHEN 650 MG RE SUPP: 650.0000 mg | Freq: Four times a day (QID) | RECTAL | Status: DC | PRN

## 2019-01-25 MED ORDER — PROCHLORPERAZINE EDISYLATE 10 MG/2ML IJ SOLN
10.0000 mg | Freq: Once | INTRAMUSCULAR | Status: AC
  Administered 2019-01-25: 04:00:00 10 mg via INTRAVENOUS
  Filled 2019-01-25: qty 2

## 2019-01-25 NOTE — Progress Notes (Addendum)
Patient is a 85 year old female with history of hypertension, proximal A. fib, hypothyroidism, compression fracture of the spine, GERD, diverticulosis,irritable bowel syndrome who presented with abdomen pain associated with intermittent diarrhea alternating with constipation. Abd/pelvis CT showed circumferential edematous mural thickening of the distal transverse, splenic flexure, descending and proximal sigmoid colon consistent with colitis of infectious, inflammatory or vascular etiology.  Diverticulosis without finding of diverticulitis.  She has been started on IV fluids, IV antibiotics for possible infectious colitis.She has severe leucocytosis.I have low suspicion for C diff because she is not currently having any diarrhea and she was not on antibiotics. Patient seen and examined at the bedside this afternoon.  Currently she is hemodynamically stable.  She denies any abdominal pain during my evaluation.  GI pathogen panel has been requested but patient does not have a bowel movement at present.  Her abdomen is mildly distended, non tender.  She has history of paroxysmal A. fib and currently in normal sinus rhythm.  She is on Toprol for rate control.  Not on anticoagulation.  She is a pleasant elderly female and lives by herself.  She has a daughter who lives [place]. Patient seen by Dr. Maudie Mercury this morning.  I agree with assessment and plan. I called her daughter and  updated the information.She has H/O compression fractures.I also requested for PT/OT evaluation. Will check her CBC tomorrow.  Started on clear liquid diet.  Addendum: Apparently the CXR showed LLL PNA. Changed Abx to zosyn.

## 2019-01-25 NOTE — ED Notes (Signed)
RN spoke to daughter at this time and gave updates.   Tania Ade [phone]

## 2019-01-25 NOTE — ED Notes (Signed)
Pt placed on 2 L/M via Nasal Cannula w/ RN in room.

## 2019-01-25 NOTE — Progress Notes (Signed)
Pharmacy Antibiotic Note  Gina Terry is a 85 y.o. female admitted on 01/24/2019 with intra-abdominal infection.  Pharmacy has been consulted for cipro dosing.  Plan: Cipro 400 mg IV q24h Flagyl 500 mg IV q8h (Md) F/u scr/cultures  Height: 5\' 2"  (157.5 cm) Weight: 95 lb (43.1 kg) IBW/kg (Calculated) : 50.1  Temp (24hrs), Avg:97.6 F (36.4 C), Min:97.5 F (36.4 C), Max:97.6 F (36.4 C)  Recent Labs  Lab 01/24/19 2117  WBC 20.4*  CREATININE 1.14*  LATICACIDVEN 1.6    Estimated Creatinine Clearance: 20.1 mL/min (A) (by C-G formula based on SCr of 1.14 mg/dL (H)).    Allergies  Allergen Reactions  . Rofecoxib     Other reaction(s): Other (See Comments) reflux, edema   . Sulfamethoxazole-Trimethoprim Nausea Only  . Codeine Other (See Comments)    unknown Other reaction(s): GI Upset (intolerance)  . Estradiol Rash    Local rash  . Flurbiprofen     Other reaction(s): GI Upset (intolerance)  . Ibuprofen     Other reaction(s): GI Upset (intolerance)    Antimicrobials this admission: 12/26 cipro >>  12/26 flagyl >>   Dose adjustments this admission:   Microbiology results:  BCx:   UCx:   Sputum:    MRSA PCR:   Thank you for allowing pharmacy to be a part of this patient's care.  Dorrene German 01/25/2019 5:13 AM

## 2019-01-25 NOTE — ED Notes (Signed)
Upon checking pt, pt stated she hasn't felt like urinating due to "I'm dehydrated." Pt's brief presented as dry upon checking, no urine collected with external catheter.

## 2019-01-25 NOTE — ED Notes (Signed)
Attempted to call report to floor RN at this time.

## 2019-01-25 NOTE — ED Notes (Addendum)
Pt provided peri-care from voiding, pt brief changed, purewick applied, pt repositioned by EMT & RN. Unable to collect urine sample during care due to voiding in brief, will collect from [place] when ready.

## 2019-01-25 NOTE — ED Provider Notes (Signed)
Care assumed from Dr. Ralene Bathe, patient with abdominal pain and very high WBC.  At time of transfer of care,CT of abdomen and pelvis was pending. CT scan shows probable colitis. She is started on Ciprofloxacin and metronidazole. Case is discussed with Dr. Maudie Mercury, who agrees to admit the patient.  Results for orders placed or performed during the hospital encounter of 01/24/19  Comprehensive metabolic panel  Result Value Ref Range   Sodium 133 (L) 135 - 145 mmol/L   Potassium 4.1 3.5 - 5.1 mmol/L   Chloride 98 98 - 111 mmol/L   CO2 21 (L) 22 - 32 mmol/L   Glucose, Bld 111 (H) 70 - 99 mg/dL   BUN 22 8 - 23 mg/dL   Creatinine, Ser 1.14 (H) 0.44 - 1.00 mg/dL   Calcium 9.9 8.9 - 10.3 mg/dL   Total Protein 6.9 6.5 - 8.1 g/dL   Albumin 4.0 3.5 - 5.0 g/dL   AST 26 15 - 41 U/L   ALT 20 0 - 44 U/L   Alkaline Phosphatase 59 38 - 126 U/L   Total Bilirubin 0.9 0.3 - 1.2 mg/dL   GFR calc non Af Amer 41 (L) >60 mL/min   GFR calc Af Amer 47 (L) >60 mL/min   Anion gap 14 5 - 15  CBC with Differential  Result Value Ref Range   WBC 20.4 (H) 4.0 - 10.5 K/uL   RBC 5.20 (H) 3.87 - 5.11 MIL/uL   Hemoglobin 15.5 (H) 12.0 - 15.0 g/dL   HCT 47.1 (H) 36.0 - 46.0 %   MCV 90.6 80.0 - 100.0 fL   MCH 29.8 26.0 - 34.0 pg   MCHC 32.9 30.0 - 36.0 g/dL   RDW 14.3 11.5 - 15.5 %   Platelets 239 150 - 400 K/uL   nRBC 0.0 0.0 - 0.2 %   Neutrophils Relative % 75 %   Neutro Abs 15.0 (H) 1.7 - 7.7 K/uL   Lymphocytes Relative 4 %   Lymphs Abs 0.9 0.7 - 4.0 K/uL   Monocytes Relative 20 %   Monocytes Absolute 4.1 (H) 0.1 - 1.0 K/uL   Eosinophils Relative 0 %   Eosinophils Absolute 0.1 0.0 - 0.5 K/uL   Basophils Relative 0 %   Basophils Absolute 0.0 0.0 - 0.1 K/uL   WBC Morphology MORPHOLOGY UNREMARKABLE    Immature Granulocytes 1 %   Abs Immature Granulocytes 0.26 (H) 0.00 - 0.07 K/uL  Lactic acid, plasma  Result Value Ref Range   Lactic Acid, Venous 1.6 0.5 - 1.9 mmol/L   CT Abdomen Pelvis W Contrast  Result Date:  01/25/2019 CLINICAL DATA:  Generalized abdominal pain and tenderness, history of atrial fibrillation EXAM: CT ABDOMEN AND PELVIS WITH CONTRAST TECHNIQUE: Multidetector CT imaging of the abdomen and pelvis was performed using the standard protocol following bolus administration of intravenous contrast. CONTRAST:  47mL OMNIPAQUE IOHEXOL 300 MG/ML  SOLN COMPARISON:  CT abdomen pelvis July 23, 2007 FINDINGS: Lower chest: There are dense bandlike areas of basilar scarring and or atelectasis. With similar features in the lingula as well. Mild cardiomegaly. No pericardial effusion. Hepatobiliary: No focal liver abnormality is seen. Patient is post cholecystectomy. Slight prominence of the biliary tree likely related to reservoir effect. No calcified intraductal gallstones. Pancreas: Unremarkable. No pancreatic ductal dilatation or surrounding inflammatory changes. Spleen: Normal in size without focal abnormality. Adrenals/Urinary Tract: Scattered subcentimeter hypoattenuating foci throughout both kidneys are too small to fully characterize on CT imaging but statistically likely benign. A left extrarenal  pelvis is similar to comparison. No hydronephrosis or urolithiasis. No worrisome renal lesions. Urinary bladder is unremarkable. Stomach/Bowel: Distal esophagus, stomach and duodenal sweep are unremarkable. No small bowel wall thickening or dilatation. No evidence of obstruction. The appendix is surgically absent. The colon is markedly redundant with much of the transverse colon displaced to the low anterior pelvis. There is circumferential edematous mural thickening of the distal transverse, splenic flexure, descending and proximal sigmoid colon innumerable sigmoid colonic diverticular are present without focal diverticular inflammation. Vascular/Lymphatic: Atherosclerotic plaque within the normal caliber aorta. No suspicious or enlarged lymph nodes in the included lymphatic chains. Reproductive: Diminutive, atrophic  appearance of the uterus, expected for patient age. No concerning adnexal lesions. Other: Diffuse circumferential body wall edema is noted. Increased attenuation of the mesentery likely reflecting some mild third-spacing of fluid. No bowel containing hernias. Musculoskeletal: Multilevel degenerative changes are present in the imaged portions of the spine. No acute osseous abnormality or suspicious osseous lesion. The osseous structures appear diffusely demineralized which may limit detection of small or nondisplaced fractures. IMPRESSION: 1. Circumferential edematous mural thickening of the distal transverse, splenic flexure, descending and proximal sigmoid colon consistent with colitis of infectious, inflammatory or vascular etiology. 2. Colonic diverticulosis without evidence for diverticulitis. 3. Diffuse body wall edema. 4. Aortic Atherosclerosis (ICD10-I70.0). Electronically Signed   By: Lovena Le M.D.   On: [id number]      Delora Fuel, [place] 123456 [phone]

## 2019-01-25 NOTE — Progress Notes (Signed)
PHARMACY NOTE -  [place] has been assisting with dosing of Zosyn for intra-abdominal infection and pneumonia.  Dosage remains stable at 2.25 g IV q8 hr and need for further dosage adjustment appears unlikely at present given SCr at baseline (borderline CrCl; chose lower dose and frequency with 43 kg weight)  Pharmacy will sign off, following peripherally for culture results or dose adjustments. Please reconsult if a change in clinical status warrants re-evaluation of dosage.  Reuel Boom, PharmD, BCPS [phone] 01/25/2019, 7:53 PM

## 2019-01-25 NOTE — ED Notes (Signed)
In ct  

## 2019-01-25 NOTE — H&P (Addendum)
TRH H&P    Patient Demographics:    Gina Terry, is a 85 y.o. female  MRN: [id number]  DOB - 12-Sep-1921  Admit Date - 01/24/2019  Referring MD/NP/PA:   Delora Fuel  Outpatient Primary MD for the patient is Altheimer, Legrand Como, MD Peter Martinique - cardiology Erskine Emery - GI  Patient coming from:  home  Chief complaint-  Abdominal pain   HPI:    Gina Terry  is a 85 y.o. female,  w hypertension, LBBB, Pafib, hypothyroidism, osteoporosis, compression fracture of spine,  gerd, diverticulosis, apparently presents w abdominal pain intermittently for the past several months associated w intermittent diarrhea alternating with constipation.  Pt states that her abdominal pain is sharp, crampy and worse over the past 1-2 days.  Pt notes slight dry heaving.  Pt notes constipation, pt denies fever, chills, diarrhea, brbpr, black stool.  Pt presented due to her abdominal pain, and constipation.   Pt states had colonoscopy about 10-12 years ago. 05/14/2007   -> diverticulosis.    In ED,  T 97.5, P 71 R 17, Bp 159/72  Pox 97% on RA   CT abd/ pelvis IMPRESSION: 1. Circumferential edematous mural thickening of the distal transverse, splenic flexure, descending and proximal sigmoid colon consistent with colitis of infectious, inflammatory or vascular etiology. 2. Colonic diverticulosis without evidence for diverticulitis. 3. Diffuse body wall edema. 4. Aortic Atherosclerosis (ICD10-I70.0).  Wbc 20.4, hgb 15.5, Plt 239 Na 133, K 4.1, Bun 22, Creatinine 1.14 Ast 26, Alt 20 Lactic acid 1.6  Urinalysis pending  covid -19 pending  Pt will be admitted for colitis w leukocytosis.        Review of systems:    In addition to the HPI above,  No Fever-chills, No Headache, No changes with Vision or hearing, No problems swallowing food or Liquids, No Chest pain, Cough or Shortness of Breath, No Blood in stool or  Urine, No dysuria, No new skin rashes or bruises, No new joints pains-aches,  No new weakness, tingling, numbness in any extremity, No recent weight gain or loss, No polyuria, polydypsia or polyphagia, No significant Mental Stressors.  All other systems reviewed and are negative.    Past History of the following :    Past Medical History:  Diagnosis Date  . Arthritis   . GERD (gastroesophageal reflux disease)   . Hypertension, essential 04/19/2016  . Hypothyroid   . Left bundle branch block   . Osteoporosis   . Paroxysmal atrial fibrillation [facility])       Past Surgical History:  Procedure Laterality Date  . APPENDECTOMY    . CARDIOVASCULAR STRESS TEST  03-24-2009   EF 78%  . CHOLECYSTECTOMY    . TONSILLECTOMY AND ADENOIDECTOMY    . US ECHOCARDIOGRAPHY  05-07-2008   Est EF 50-55%      Social History:      Social History   Tobacco Use  . Smoking status: Former Smoker    Packs/day: 0.00    Types: Cigarettes    Quit date: 01/31/1984    Years  since quitting: 35.0  . Smokeless tobacco: Never Used  Substance Use Topics  . Alcohol use: Yes    Comment: 3 oz of wine before dinner every night       Family History :     Family History  Problem Relation Age of Onset  . Hip fracture Mother   . Heart attack Father   . Cancer Sister   . Stroke Brother        Home Medications:   Prior to Admission medications   Medication Sig Start Date End Date Taking? Authorizing Provider  acetaminophen (TYLENOL) 500 MG tablet Take 500 mg by mouth every 6 (six) hours as needed for mild pain.    [provider]  amLODipine (NORVASC) 2.5 MG tablet Take 1 tablet (2.5 mg total) by mouth daily. 05/27/18 05/16/20  Martinique, Peter M, MD  aspirin 81 MG tablet Take 81 mg by mouth daily.      [provider]  ibuprofen (ADVIL,MOTRIN) 200 MG tablet Take 200 mg by mouth every 6 (six) hours as needed.    [provider]  levothyroxine (SYNTHROID, LEVOTHROID) 50 MCG  tablet Take 50 mcg by mouth daily before breakfast.    [provider]  metoprolol succinate (TOPROL XL) 25 MG 24 hr tablet Take 1 tablet (25 mg total) by mouth daily. 04/23/18   Martinique, Peter M, MD  nitroGLYCERIN (NITROSTAT) 0.4 MG SL tablet Place 1 tablet (0.4 mg total) under the tongue every 5 (five) minutes as needed for chest pain. 04/06/17 07/05/17  Martinique, Peter M, MD  VITAMIN E PO Take 2,000 Units by mouth as directed.     [provider]  zolpidem (AMBIEN) 5 MG tablet Take 2.5 mg by mouth at bedtime.     [provider]     Allergies:     Allergies  Allergen Reactions  . Rofecoxib     Other reaction(s): Other (See Comments) reflux, edema   . Sulfamethoxazole-Trimethoprim Nausea Only  . Codeine Other (See Comments)    unknown Other reaction(s): GI Upset (intolerance)  . Estradiol Rash    Local rash  . Flurbiprofen     Other reaction(s): GI Upset (intolerance)  . Ibuprofen     Other reaction(s): GI Upset (intolerance)     Physical Exam:   Vitals  Blood pressure (!) 135/59, pulse 66, temperature 97.6 F (36.4 C), temperature source Oral, resp. rate 16, SpO2 93 %.  1.  General: axoxo3  2. Psychiatric: euthymic  3. Neurologic: nonfocal  4. HEENMT:  Anicteric pupils 1.33mm symmetric, direct, consensual , near intact Neck: no jvd  5. Respiratory : CTAB  6. Cardiovascular : rrr s1, s2, no m/g/r   7. Gastrointestinal:  Abd: soft, nt, nd, +bs  8. Skin:  Ext: no c/c/e, no rash  9.Musculoskeletal:  Good ROM    Data Review:    CBC Recent Labs  Lab 01/24/19 2117  WBC 20.4*  HGB 15.5*  HCT 47.1*  PLT 239  MCV 90.6  MCH 29.8  MCHC 32.9  RDW 14.3  LYMPHSABS 0.9  MONOABS 4.1*  EOSABS 0.1  BASOSABS 0.0   ------------------------------------------------------------------------------------------------------------------  Results for orders placed or performed during the hospital encounter of 01/24/19 (from the past 48  hour(s))  Comprehensive metabolic panel     Status: Abnormal   Collection Time: 01/24/19  9:17 PM  Result Value Ref Range   Sodium 133 (L) 135 - 145 mmol/L   Potassium 4.1 3.5 - 5.1 mmol/L   Chloride  98 98 - 111 mmol/L   CO2 21 (L) 22 - 32 mmol/L   Glucose, Bld 111 (H) 70 - 99 mg/dL   BUN 22 8 - 23 mg/dL   Creatinine, Ser 1.14 (H) 0.44 - 1.00 mg/dL   Calcium 9.9 8.9 - 10.3 mg/dL   Total Protein 6.9 6.5 - 8.1 g/dL   Albumin 4.0 3.5 - 5.0 g/dL   AST 26 15 - 41 U/L   ALT 20 0 - 44 U/L   Alkaline Phosphatase 59 38 - 126 U/L   Total Bilirubin 0.9 0.3 - 1.2 mg/dL   GFR calc non Af Amer 41 (L) >60 mL/min   GFR calc Af Amer 47 (L) >60 mL/min   Anion gap 14 5 - 15    Comment: Performed at [facility], [place] [address] [place], [place] [id number]  CBC with Differential     Status: Abnormal   Collection Time: 01/24/19  9:17 PM  Result Value Ref Range   WBC 20.4 (H) 4.0 - 10.5 K/uL   RBC 5.20 (H) 3.87 - 5.11 MIL/uL   Hemoglobin 15.5 (H) 12.0 - 15.0 g/dL   HCT 47.1 (H) 36.0 - 46.0 %   MCV 90.6 80.0 - 100.0 fL   MCH 29.8 26.0 - 34.0 pg   MCHC 32.9 30.0 - 36.0 g/dL   RDW 14.3 11.5 - 15.5 %   Platelets 239 150 - 400 K/uL   nRBC 0.0 0.0 - 0.2 %   Neutrophils Relative % 75 %   Neutro Abs 15.0 (H) 1.7 - 7.7 K/uL   Lymphocytes Relative 4 %   Lymphs Abs 0.9 0.7 - 4.0 K/uL   Monocytes Relative 20 %   Monocytes Absolute 4.1 (H) 0.1 - 1.0 K/uL   Eosinophils Relative 0 %   Eosinophils Absolute 0.1 0.0 - 0.5 K/uL   Basophils Relative 0 %   Basophils Absolute 0.0 0.0 - 0.1 K/uL   WBC Morphology MORPHOLOGY UNREMARKABLE    Immature Granulocytes 1 %   Abs Immature Granulocytes 0.26 (H) 0.00 - 0.07 K/uL    Comment: Performed at [facility], [place] [address], [place], [place] [address]  Lactic acid, plasma     Status: None   Collection Time: 01/24/19  9:17 PM  Result Value Ref Range   Lactic Acid, Venous 1.6 0.5 - 1.9 mmol/L    Comment: Performed at [facility], [address] [place], [place] [address]    Chemistries  Recent Labs  Lab 01/24/19 2117  NA 133*  K 4.1  CL 98  CO2 21*  GLUCOSE 111*  BUN 22  CREATININE 1.14*  CALCIUM 9.9  AST 26  ALT 20  ALKPHOS 59  BILITOT 0.9   ------------------------------------------------------------------------------------------------------------------  ------------------------------------------------------------------------------------------------------------------ GFR: CrCl cannot be calculated (Unknown ideal weight.). Liver Function Tests: Recent Labs  Lab 01/24/19 2117  AST 26  ALT 20  ALKPHOS 59  BILITOT 0.9  PROT 6.9  ALBUMIN 4.0   No results for input(s): LIPASE, AMYLASE in the last 168 hours. No results for input(s): AMMONIA in the last 168 hours. Coagulation Profile: No results for input(s): INR, PROTIME in the last 168 hours. Cardiac Enzymes: No results for input(s): CKTOTAL, CKMB, CKMBINDEX, TROPONINI in the last 168 hours. BNP (last 3 results) No results for input(s): PROBNP in the last 8760 hours. HbA1C: No results for input(s): HGBA1C in the last 72 hours. CBG: No results for input(s): GLUCAP in the last 168 hours. Lipid Profile:  No results for input(s): CHOL, HDL, LDLCALC, TRIG, CHOLHDL, LDLDIRECT in the last 72 hours. Thyroid Function Tests: No results for input(s): TSH, T4TOTAL, FREET4, T3FREE, THYROIDAB in the last 72 hours. Anemia Panel: No results for input(s): VITAMINB12, FOLATE, FERRITIN, TIBC, IRON, RETICCTPCT in the last 72 hours.  --------------------------------------------------------------------------------------------------------------- Urine analysis: No results found for: COLORURINE, APPEARANCEUR, LABSPEC, PHURINE, GLUCOSEU, HGBUR, BILIRUBINUR, KETONESUR, PROTEINUR, UROBILINOGEN, NITRITE, LEUKOCYTESUR    Imaging Results:    CT Abdomen Pelvis W Contrast  Result Date: 01/25/2019 CLINICAL DATA:  Generalized abdominal  pain and tenderness, history of atrial fibrillation EXAM: CT ABDOMEN AND PELVIS WITH CONTRAST TECHNIQUE: Multidetector CT imaging of the abdomen and pelvis was performed using the standard protocol following bolus administration of intravenous contrast. CONTRAST:  78mL OMNIPAQUE IOHEXOL 300 MG/ML  SOLN COMPARISON:  CT abdomen pelvis July 23, 2007 FINDINGS: Lower chest: There are dense bandlike areas of basilar scarring and or atelectasis. With similar features in the lingula as well. Mild cardiomegaly. No pericardial effusion. Hepatobiliary: No focal liver abnormality is seen. Patient is post cholecystectomy. Slight prominence of the biliary tree likely related to reservoir effect. No calcified intraductal gallstones. Pancreas: Unremarkable. No pancreatic ductal dilatation or surrounding inflammatory changes. Spleen: Normal in size without focal abnormality. Adrenals/Urinary Tract: Scattered subcentimeter hypoattenuating foci throughout both kidneys are too small to fully characterize on CT imaging but statistically likely benign. A left extrarenal pelvis is similar to comparison. No hydronephrosis or urolithiasis. No worrisome renal lesions. Urinary bladder is unremarkable. Stomach/Bowel: Distal esophagus, stomach and duodenal sweep are unremarkable. No small bowel wall thickening or dilatation. No evidence of obstruction. The appendix is surgically absent. The colon is markedly redundant with much of the transverse colon displaced to the low anterior pelvis. There is circumferential edematous mural thickening of the distal transverse, splenic flexure, descending and proximal sigmoid colon innumerable sigmoid colonic diverticular are present without focal diverticular inflammation. Vascular/Lymphatic: Atherosclerotic plaque within the normal caliber aorta. No suspicious or enlarged lymph nodes in the included lymphatic chains. Reproductive: Diminutive, atrophic appearance of the uterus, expected for patient age. No  concerning adnexal lesions. Other: Diffuse circumferential body wall edema is noted. Increased attenuation of the mesentery likely reflecting some mild third-spacing of fluid. No bowel containing hernias. Musculoskeletal: Multilevel degenerative changes are present in the imaged portions of the spine. No acute osseous abnormality or suspicious osseous lesion. The osseous structures appear diffusely demineralized which may limit detection of small or nondisplaced fractures. IMPRESSION: 1. Circumferential edematous mural thickening of the distal transverse, splenic flexure, descending and proximal sigmoid colon consistent with colitis of infectious, inflammatory or vascular etiology. 2. Colonic diverticulosis without evidence for diverticulitis. 3. Diffuse body wall edema. 4. Aortic Atherosclerosis (ICD10-I70.0). Electronically Signed   By: Lovena Le M.D.   On: 01/25/2019 01:47    nsr at 70, nl axis, nl int, + lvh, no st-t change c/w ischemia   Assessment & Plan:    Principal Problem:   Colitis Active Problems:   Paroxysmal atrial fibrillation (HCC)   Gastroesophageal reflux   Hypertension, essential   Primary hypothyroidism  Abdominal pain secondary to Colitis cipro iv pharmacy to dose, Flagyl 500mg  iv tid Consider GI consult if not improving Check cbc in am  Hypertension, Pafib Cont Amlodipine 2.5mg  po qday Cont Toprol XL 25mg  po qday Cont Aspirin   Hypothyroidism Cont Levothyroxine 50 micrograms po qday    DVT Prophylaxis-   Lovenox - SCDs   AM Labs Ordered, also please review Full Orders  Family Communication: Admission,  patients condition and plan of care including tests being ordered have been discussed with the patient  who indicate understanding and agree with the plan and Code Status.  Code Status:  DNR per patient , notified daughter that pt will be admitted to [facility]  Admission status:  Inpatient: Based on patients clinical presentation and evaluation of above clinical  data, I have made determination that patient meets Inpatient criteria at this time.pt has colitis with significant wbc elevation 20,  Due to her age and comorbidities , pt has high risk of clinical deterioration.  Pt will require >2 nites stay.   Time spent in minutes :  55 minutes   Jani Gravel M.D on 01/25/2019 at 5:06 AM

## 2019-01-26 LAB — BASIC METABOLIC PANEL
Anion gap: 16 — ABNORMAL HIGH (ref 5–15)
BUN: 21 mg/dL (ref 8–23)
CO2: 18 mmol/L — ABNORMAL LOW (ref 22–32)
Calcium: 7.7 mg/dL — ABNORMAL LOW (ref 8.9–10.3)
Chloride: 100 mmol/L (ref 98–111)
Creatinine, Ser: 1.18 mg/dL — ABNORMAL HIGH (ref 0.44–1.00)
GFR calc Af Amer: 45 mL/min — ABNORMAL LOW (ref >60)
GFR calc non Af Amer: 39 mL/min — ABNORMAL LOW (ref >60)
Glucose, Bld: 82 mg/dL (ref 70–99)
Potassium: 4.2 mmol/L (ref 3.5–5.1)
Sodium: 134 mmol/L — ABNORMAL LOW (ref 135–145)

## 2019-01-26 LAB — CBC WITH DIFFERENTIAL/PLATELET
Abs Immature Granulocytes: 0.4 10*3/uL — ABNORMAL HIGH (ref 0.00–0.07)
Basophils Absolute: 0 10*3/uL (ref 0.0–0.1)
Basophils Relative: 0 %
Eosinophils Absolute: 0 10*3/uL (ref 0.0–0.5)
Eosinophils Relative: 0 %
HCT: 36 % (ref 36.0–46.0)
Hemoglobin: 11.4 g/dL — ABNORMAL LOW (ref 12.0–15.0)
Immature Granulocytes: 2 %
Lymphocytes Relative: 3 %
Lymphs Abs: 0.8 10*3/uL (ref 0.7–4.0)
MCH: 29.8 pg (ref 26.0–34.0)
MCHC: 31.7 g/dL (ref 30.0–36.0)
MCV: 94.2 fL (ref 80.0–100.0)
Monocytes Absolute: 6.1 10*3/uL — ABNORMAL HIGH (ref 0.1–1.0)
Monocytes Relative: 25 %
Neutro Abs: 17.3 10*3/uL — ABNORMAL HIGH (ref 1.7–7.7)
Neutrophils Relative %: 70 %
Platelets: 178 10*3/uL (ref 150–400)
RBC: 3.82 MIL/uL — ABNORMAL LOW (ref 3.87–5.11)
RDW: 14.9 % (ref 11.5–15.5)
WBC: 24.6 10*3/uL — ABNORMAL HIGH (ref 4.0–10.5)
nRBC: 0 % (ref 0.0–0.2)

## 2019-01-26 NOTE — Evaluation (Signed)
Occupational Therapy Evaluation Patient Details Name: Gina Terry MRN: [id number] DOB: [birth year] Today's Date: 01/26/2019    History of Present Illness Patient is a 85 year old female with history of hypertension, proximal A. fib, hypothyroidism, compression fracture of the spine, GERD, diverticulosis,irritable bowel syndrome who presented with abdomen pain associated with intermittent diarrhea alternating with constipation.Abd/pelvis CT showed circumferential edematous mural thickening of the distal transverse, splenic flexure, descending and proximal sigmoid colon consistent with colitis of infectious, inflammatory or vascularetiology.  Diverticulosis without finding of diverticulitis   Clinical Impression   Pt admitted with the above. Pt currently with functional limitations due to the deficits listed below (see OT Problem List).  Pt will benefit from skilled OT to increase their safety and independence with ADL and functional mobility for ADL to facilitate discharge to venue listed below.   Pt will need 24.7 A upon DC which pt states is reasonable. Pt pleasant and motivated to work with therapy       Follow Up Recommendations  Home health OT;Supervision/Assistance - 24 hour    Equipment Recommendations  None recommended by OT    Recommendations for Other Services       Precautions / Restrictions Precautions Precautions: Fall      Mobility Bed Mobility Overal bed mobility: Needs Assistance Bed Mobility: Rolling;Sidelying to Sit Rolling: Min assist Sidelying to sit: Mod assist          Transfers Overall transfer level: Needs assistance Equipment used: Rolling walker (2 wheeled) Transfers: Sit to/from [facility] Sit to Stand: Min assist;Mod assist Stand pivot transfers: Min assist       General transfer comment: VC for hand placement    Balance Overall balance assessment: Needs assistance   Sitting balance-Leahy Scale: Good     Standing  balance support: Bilateral upper extremity supported Standing balance-Leahy Scale: Fair                             ADL either performed or assessed with clinical judgement   ADL Overall ADL's : Needs assistance/impaired Eating/Feeding: Set up;Sitting   Grooming: Wash/dry face;Sitting   Upper Body Bathing: Set up;Sitting   Lower Body Bathing: Moderate assistance;Sit to/from stand;Cueing for safety;Cueing for compensatory techniques   Upper Body Dressing : Set up;Sitting   Lower Body Dressing: Moderate assistance;Sit to/from stand;Cueing for compensatory techniques;Cueing for safety   Toilet Transfer: Minimal assistance;BSC;Stand-pivot;Cueing for safety;Cueing for sequencing   Toileting- Clothing Manipulation and Hygiene: Moderate assistance;Sit to/from stand         General ADL Comments: pt with R foot drop.  Pt did a good job compensating for R foot drop but do feel this increases her risk of falling     Vision Baseline Vision/History: Wears glasses       Perception     Praxis      Pertinent Vitals/Pain Pain Assessment: 0-10 Pain Score: 3  Pain Location: lower back - across back and R big toe Pain Descriptors / Indicators: Discomfort Pain Intervention(s): Limited activity within patient's tolerance;Monitored during session;Repositioned     Hand Dominance     Extremity/Trunk Assessment Upper Extremity Assessment Upper Extremity Assessment: Generalized weakness           Communication Communication Communication: No difficulties   Cognition Arousal/Alertness: Awake/alert Behavior During Therapy: WFL for tasks assessed/performed Overall Cognitive Status: Within Functional Limits for tasks assessed  Home Living Family/patient expects to be discharged to:: Private residence Living Arrangements: Alone Available Help at Discharge: Personal care attendant Type of Home: House Home  Access: Stairs to enter [employer] of Steps: 2   Home Layout: Multi-level;Full bath on main level     Bathroom Shower/Tub: Occupational psychologist: Standard     Home Equipment: Environmental consultant - 2 wheels;Shower seat                   OT Problem List: Decreased strength;Decreased activity tolerance;Impaired balance (sitting and/or standing);Pain      OT Treatment/Interventions: Self-care/ADL training;Patient/family education;DME and/or AE instruction    OT Goals(Current goals can be found in the care plan section) Acute Rehab OT Goals Patient Stated Goal: home with A OT Goal Formulation: With patient Time For Goal Achievement: 02/02/19 Potential to Achieve Goals: Good  OT Frequency: Min 2X/week   Barriers to D/C:               AM-PAC OT "6 Clicks" Daily Activity     Outcome Measure Help from another person eating meals?: None Help from another person taking care of personal grooming?: A Little Help from another person toileting, which includes using toliet, bedpan, or urinal?: A Little Help from another person bathing (including washing, rinsing, drying)?: A Little Help from another person to put on and taking off regular upper body clothing?: A Little Help from another person to put on and taking off regular lower body clothing?: A Lot 6 Click Score: 18   End of Session Equipment Utilized During Treatment: Rolling walker Nurse Communication: Mobility status  Activity Tolerance: Patient tolerated treatment well Patient left: in chair;with call bell/phone within reach  OT Visit Diagnosis: Unsteadiness on feet (R26.81);Muscle weakness (generalized) (M62.81);History of falling (Z91.81)                Time: [id number] OT Time Calculation (min): 30 min Charges:  OT General Charges $OT Visit: 1 Visit OT Evaluation $OT Eval Moderate Complexity: 1 Mod  Kari Baars, OT Acute Rehabilitation Services [phone] Office- [phone]     Claudy Abdallah, Edwena Felty D 01/26/2019, 1:31 PM

## 2019-01-26 NOTE — Evaluation (Signed)
Physical Therapy Evaluation Patient Details Name: Gina Terry MRN: [id number] DOB: 1921-06-17 Today's Date: 01/26/2019   History of Present Illness  Patient is a 85 year old female with history of hypertension, proximal A. fib, hypothyroidism, compression fracture of the spine, GERD, diverticulosis,irritable bowel syndrome who presented with abdomen pain associated with intermittent diarrhea alternating with constipation.Abd/pelvis CT showed circumferential edematous mural thickening of the distal transverse, splenic flexure, descending and proximal sigmoid colon consistent with colitis of infectious, inflammatory or vascularetiology.  Diverticulosis without finding of diverticulitis  Clinical Impression  Pt admitted as above and presenting with functional mobility limitations 2* generalized weakness, poor endurance, and balance deficits - exacerbated by R foot drop (pt states ongoing x~2wks and that she told the Dr about it).  Pt's goal is to dc home but to do so safely, pt would benefit from 24/7 assist for safety.  As well, pt could benefit from an AFO on the R foot to minimize foot drop.    Follow Up Recommendations Home health PT;Supervision/Assistance - 24 hour    Equipment Recommendations  Other (comment)(R AFO for foot drop)    Recommendations for Other Services       Precautions / Restrictions Precautions Precautions: Fall Restrictions Weight Bearing Restrictions: No      Mobility  Bed Mobility Overal bed mobility: Needs Assistance Bed Mobility: Rolling;Sidelying to Sit Rolling: Min assist Sidelying to sit: Mod assist          Transfers Overall transfer level: Needs assistance Equipment used: Rolling walker (2 wheeled) Transfers: Sit to/from [facility] Sit to Stand: Min assist;Mod assist Stand pivot transfers: Min assist       General transfer comment: VC for hand placement  Ambulation/Gait Ambulation/Gait assistance: Min assist Gait  Distance (Feet): 35 Feet Assistive device: Rolling walker (2 wheeled) Gait Pattern/deviations: Step-to pattern;Step-through pattern;Decreased step length - right;Decreased step length - left;Shuffle;Trunk flexed;Steppage Gait velocity: decr   General Gait Details: cues for posture and position from RW; noted R LE steppage to compensate for R foot drop  Stairs            Wheelchair Mobility    Modified Rankin (Stroke Patients Only)       Balance Overall balance assessment: Needs assistance   Sitting balance-Leahy Scale: Good     Standing balance support: Bilateral upper extremity supported Standing balance-Leahy Scale: Fair                               Pertinent Vitals/Pain Pain Assessment: 0-10 Pain Score: 3  Pain Location: lower back - across back and R big toe Pain Descriptors / Indicators: Discomfort Pain Intervention(s): Limited activity within patient's tolerance;Monitored during session;Premedicated before session    Home Living Family/patient expects to be discharged to:: Private residence Living Arrangements: Alone Available Help at Discharge: Personal care attendant Type of Home: House Home Access: Stairs to enter   [employer] of Steps: 2 Home Layout: Multi-level;Full bath on main level Home Equipment: Walker - 2 wheels;Shower seat      Prior Function Level of Independence: Independent with assistive device(s);Needs assistance   Gait / Transfers Assistance Needed: RW  ADL's / Homemaking Assistance Needed: assist of PCA  Comments: Pt has PCA assist but not 24/7     Hand Dominance        Extremity/Trunk Assessment   Upper Extremity Assessment Upper Extremity Assessment: Generalized weakness    Lower Extremity Assessment Lower Extremity Assessment: Generalized weakness;RLE  deficits/detail RLE Deficits / Details: DF absent on R foot; with noted foot drop during ambulation    Cervical / Trunk Assessment Cervical  / Trunk Assessment: Kyphotic  Communication   Communication: No difficulties  Cognition Arousal/Alertness: Awake/alert Behavior During Therapy: WFL for tasks assessed/performed Overall Cognitive Status: Within Functional Limits for tasks assessed                                        General Comments      Exercises     Assessment/Plan    PT Assessment Patient needs continued PT services  PT Problem List Decreased strength;Decreased activity tolerance;Decreased balance;Decreased mobility;Decreased knowledge of use of DME       PT Treatment Interventions DME instruction;Gait training;Functional mobility training;Therapeutic activities;Therapeutic exercise;Stair training;Balance training;Patient/family education    PT Goals (Current goals can be found in the Care Plan section)  Acute Rehab PT Goals Patient Stated Goal: home  PT Goal Formulation: With patient Time For Goal Achievement: 02/08/19 Potential to Achieve Goals: Good    Frequency Min 3X/week   Barriers to discharge Decreased caregiver support PCA but not 24/7 assist    Co-evaluation               AM-PAC PT "6 Clicks" Mobility  Outcome Measure Help needed turning from your back to your side while in a flat bed without using bedrails?: A Little Help needed moving from lying on your back to sitting on the side of a flat bed without using bedrails?: A Little Help needed moving to and from a bed to a chair (including a wheelchair)?: A Little Help needed standing up from a chair using your arms (e.g., wheelchair or bedside chair)?: A Little Help needed to walk in hospital room?: A Little Help needed climbing 3-5 steps with a railing? : A Lot 6 Click Score: 17    End of Session Equipment Utilized During Treatment: Gait belt Activity Tolerance: Patient limited by fatigue Patient left: in chair;with call bell/phone within reach Nurse Communication: Mobility status PT Visit Diagnosis: Muscle  weakness (generalized) (M62.81);Difficulty in walking, not elsewhere classified (R26.2)    Time: [id number] PT Time Calculation (min) (ACUTE ONLY): 31 min   Charges:   PT Evaluation $PT Eval Low Complexity: 1 Low          [facility] Pager [phone] Office [phone]   Cayne Yom 01/26/2019, 4:56 PM

## 2019-01-26 NOTE — Progress Notes (Signed)
PROGRESS NOTE  Gina Terry [id number] DOB: November 19, 1921 DOA: 01/24/2019 PCP: Lorne Skeens, MD   LOS: 1 day   Brief Narrative / Interim history: 85 year old female with HTN, paroxysmal A. fib, hypothyroidism, compression fractures of the spine, GERD, diverticulosis, IBS who came in with intermittent abdominal pain with diarrhea alternating with constipation.  CT scan of the abdomen and pelvis showed in the ER showed findings concerning for colitis of the distal transverse, splenic flexure and descending and proximal sigmoid colon.  She was placed on IV fluids and IV antibiotics and was admitted to the hospital.  No diarrhea since admission and C. difficile was not sent  Subjective / 24h Interval events: States that she is feeling a lot better this morning, less abdominal pain and wants to eat.  Assessment & Plan: Principal Problem Abdominal pain secondary to colitis -Patient started on Zosyn, clinically she seems to be improving and her Davia count is improving, continue as she is responding to IV antibiotics -Advance diet as tolerated -Continue to monitor  Active Problems Paroxysmal A. fib -Patient is on aspirin, continue, continue metoprolol for rate control  Hypertension -Continue Norvasc, metoprolol  Hypothyroidism -continue Synthroid  Scheduled Meds: . amLODipine  2.5 mg Oral Daily  . aspirin EC  81 mg Oral Daily  . enoxaparin (LOVENOX) injection  30 mg Subcutaneous Daily  . levothyroxine  50 mcg Oral QAC breakfast  . metoprolol succinate  25 mg Oral Daily  . zolpidem  2.5 mg Oral QHS   Continuous Infusions: . piperacillin-tazobactam (ZOSYN)  IV 2.25 g (01/26/19 0556)   PRN Meds:.acetaminophen **OR** acetaminophen, HYDROmorphone (DILAUDID) injection, ondansetron (ZOFRAN) IV  DVT prophylaxis: lovenox Code Status: DNR Family Communication: no family at bedside  Disposition Plan: home when ready   Consultants:  None   Procedures:  None   Microbiology    None   Antimicrobials: Zosyn 12/26 >>    Objective: Vitals:   01/25/19 2008 01/25/19 2024 01/26/19 0530 01/26/19 1334  BP: (!) 106/55 (!) 110/57 (!) 121/59 (!) 127/55  Pulse: 74 70 70 65  Resp:  17 17 18   Temp:  98.6 F (37 C) 97.7 F (36.5 C) (!) 97.5 F (36.4 C)  TempSrc:  Oral Oral Oral  SpO2: 99% 98% 97% 99%  Weight:      Height:        Intake/Output Summary (Last 24 hours) at 01/26/2019 1457 Last data filed at 01/26/2019 0945 Gross per 24 hour  Intake 884.71 ml  Output 625 ml  Net 259.71 ml   Filed Weights   01/25/19 0500  Weight: 43.1 kg    Examination:  Constitutional: NAD Eyes: no scleral icterus ENMT: Mucous membranes are moist.  Respiratory: clear to auscultation bilaterally, no wheezing, no crackles.  Cardiovascular: Regular rate and rhythm, no murmurs / rubs / gallops. No LE edema. Good peripheral pulses Abdomen: non distended, no tenderness. Bowel sounds positive.  Musculoskeletal: no clubbing / cyanosis.  Skin: no rashes Neurologic: No focal deficits, equal strength Psychiatric: Normal judgment and insight. Alert and oriented x 3. Normal mood.    Data Reviewed: I have independently reviewed following labs and imaging studies   CBC: Recent Labs  Lab 01/24/19 2117 01/25/19 1623 01/26/19 0601  WBC 20.4* 35.7* 24.6*  NEUTROABS 15.0* 23.3* 17.3*  HGB 15.5* 12.8 11.4*  HCT 47.1* 40.2 36.0  MCV 90.6 94.1 94.2  PLT 239 206 0000000   Basic Metabolic Panel: Recent Labs  Lab 01/24/19 2117 01/26/19 0601  NA 133* 134*  K 4.1 4.2  CL 98 100  CO2 21* 18*  GLUCOSE 111* 82  BUN 22 21  CREATININE 1.14* 1.18*  CALCIUM 9.9 7.7*   Liver Function Tests: Recent Labs  Lab 01/24/19 2117  AST 26  ALT 20  ALKPHOS 59  BILITOT 0.9  PROT 6.9  ALBUMIN 4.0   Coagulation Profile: No results for input(s): INR, PROTIME in the last 168 hours. HbA1C: No results for input(s): HGBA1C in the last 72 hours. CBG: No results for input(s): GLUCAP in the  last 168 hours.  Recent Results (from the past 240 hour(s))  SARS CORONAVIRUS 2 (TAT 6-24 HRS) Nasopharyngeal Nasopharyngeal Swab     Status: None   Collection Time: 01/25/19  3:16 AM   Specimen: Nasopharyngeal Swab  Result Value Ref Range Status   SARS Coronavirus 2 NEGATIVE NEGATIVE Final    Comment: (NOTE) SARS-CoV-2 target nucleic acids are NOT DETECTED. The SARS-CoV-2 RNA is generally detectable in upper and lower respiratory specimens during the acute phase of infection. Negative results do not preclude SARS-CoV-2 infection, do not rule out co-infections with other pathogens, and should not be used as the sole basis for treatment or other patient management decisions. Negative results must be combined with clinical observations, patient history, and epidemiological information. The expected result is Negative. Fact Sheet for Patients: SugarRoll.be Fact Sheet for Healthcare Providers: https://www.woods-mathews.com/ This test is not yet approved or cleared by the [place] FDA and  has been authorized for detection and/or diagnosis of SARS-CoV-2 by FDA under an Emergency Use Authorization (EUA). This EUA will remain  in effect (meaning this test can be used) for the duration of the COVID-19 declaration under Section 56 4(b)(1) of the Act, 21 U.S.C. section 360bbb-3(b)(1), unless the authorization is terminated or revoked sooner. Performed at [facility], [place] [address], [place], [place] [address]   Culture, blood (routine x 2)     Status: None (Preliminary result)   Collection Time: 01/25/19  5:09 PM   Specimen: BLOOD  Result Value Ref Range Status   Specimen Description   Final    BLOOD LEFT ANTECUBITAL Performed at [facility] [address] [place], [place] [address]    Special Requests   Final    BOTTLES DRAWN AEROBIC ONLY Blood Culture adequate volume Performed at [facility] [address] [place], [place] [address]    Culture   Final    NO GROWTH < 12 HOURS Performed at [facility] [address] [place], [place] [address]    Report Status PENDING  Incomplete  Culture, blood (routine x 2)     Status: None (Preliminary result)   Collection Time: 01/25/19  5:09 PM   Specimen: BLOOD LEFT HAND  Result Value Ref Range Status   Specimen Description   Final    BLOOD LEFT HAND Performed at [facility] [address], [place], [place] [address]    Special Requests   Final    BOTTLES DRAWN AEROBIC ONLY Blood Culture adequate volume Performed at [facility] [address] [place], [place] [address]    Culture   Final    NO GROWTH < 12 HOURS Performed at [facility] [address], [place], [place] [address]    Report Status PENDING  Incomplete     Radiology Studies: DG Chest 1 View  Result Date: 01/25/2019 CLINICAL DATA:  85 year old with acute onset of shortness of breath.  EXAM: Portable CHEST 1 VIEW COMPARISON:  05/05/2008. FINDINGS: Cardiac silhouette moderately enlarged, increased in size since 2010. Thoracic aorta atherosclerotic and tortuous. Hilar and mediastinal contours otherwise unremarkable. Mildly prominent bronchovascular markings diffusely. Airspace consolidation with apparent air bronchograms medially at the LEFT lung base. Lungs otherwise clear. Pulmonary vascularity normal. No visible pleural effusions. IMPRESSION: 1. Acute pneumonia involving the LEFT lower lobe. 2. Moderate cardiomegaly, increased in size since 2010, without evidence of pulmonary edema. Electronically Signed   By: Evangeline Dakin M.D.   On: 01/25/2019 19:25   Marzetta Board, MD, PhD [facility]  Between 7 am - 7 pm I am available, please contact me via Amion or Securechat  Between 7 pm - 7 am I am not available, please contact night coverage MD/APP via Amion

## 2019-01-27 LAB — CBC
HCT: 34.5 % — ABNORMAL LOW (ref 36.0–46.0)
Hemoglobin: 11.2 g/dL — ABNORMAL LOW (ref 12.0–15.0)
MCH: 30.4 pg (ref 26.0–34.0)
MCHC: 32.5 g/dL (ref 30.0–36.0)
MCV: 93.5 fL (ref 80.0–100.0)
Platelets: 177 10*3/uL (ref 150–400)
RBC: 3.69 MIL/uL — ABNORMAL LOW (ref 3.87–5.11)
RDW: 14.8 % (ref 11.5–15.5)
WBC: 19.1 10*3/uL — ABNORMAL HIGH (ref 4.0–10.5)
nRBC: 0 % (ref 0.0–0.2)

## 2019-01-27 LAB — COMPREHENSIVE METABOLIC PANEL
ALT: 32 U/L (ref 0–44)
AST: 28 U/L (ref 15–41)
Albumin: 2.7 g/dL — ABNORMAL LOW (ref 3.5–5.0)
Alkaline Phosphatase: 58 U/L (ref 38–126)
Anion gap: 8 (ref 5–15)
BUN: 16 mg/dL (ref 8–23)
CO2: 21 mmol/L — ABNORMAL LOW (ref 22–32)
Calcium: 8.1 mg/dL — ABNORMAL LOW (ref 8.9–10.3)
Chloride: 104 mmol/L (ref 98–111)
Creatinine, Ser: 0.94 mg/dL (ref 0.44–1.00)
GFR calc Af Amer: 60 mL/min — ABNORMAL LOW (ref >60)
GFR calc non Af Amer: 52 mL/min — ABNORMAL LOW (ref >60)
Glucose, Bld: 89 mg/dL (ref 70–99)
Potassium: 3.7 mmol/L (ref 3.5–5.1)
Sodium: 133 mmol/L — ABNORMAL LOW (ref 135–145)
Total Bilirubin: 0.6 mg/dL (ref 0.3–1.2)
Total Protein: 5 g/dL — ABNORMAL LOW (ref 6.5–8.1)

## 2019-01-27 LAB — PATHOLOGIST SMEAR REVIEW

## 2019-01-27 MED ORDER — SODIUM CHLORIDE 0.9 % IV SOLN
INTRAVENOUS | Status: DC | PRN
  Administered 2019-01-27 – 2019-01-28 (×2): 250 mL via INTRAVENOUS

## 2019-01-27 NOTE — Progress Notes (Signed)
PT Cancellation Note  Patient Details Name: Gina Terry MRN: [id number] DOB: 08/14/1921   Cancelled Treatment:    Reason Eval/Treat Not Completed: Other (comment) Checked on pt at 41 and she request PT tomorrow, states just a little late right now.  Will follow up as able.  Maggie Font, PT Acute Rehab Services Pager [phone] [facility] [phone] [facility] [phone]    Karlton Lemon 01/27/2019, 5:11 PM

## 2019-01-27 NOTE — Progress Notes (Signed)
PROGRESS NOTE  Gina Terry [id number] DOB: 03/03/1921 DOA: 01/24/2019 PCP: Lorne Skeens, MD   LOS: 2 days   Brief Narrative / Interim history: 85 year old female with HTN, paroxysmal A. fib, hypothyroidism, compression fractures of the spine, GERD, diverticulosis, IBS who came in with intermittent abdominal pain with diarrhea alternating with constipation.  CT scan of the abdomen and pelvis showed in the ER showed findings concerning for colitis of the distal transverse, splenic flexure and descending and proximal sigmoid colon.  She was placed on IV fluids and IV antibiotics and was admitted to the hospital.  No diarrhea since admission and C. difficile was not sent  Subjective / 24h Interval events: Says that she has a poor appetite, denies any abdominal pain, no nausea or vomiting  Assessment & Plan: Principal Problem Abdominal pain secondary to colitis -Patient started on Zosyn, continues to improve, Resnick count decreasing further from 30 5K couple days ago to 19 K this morning.  Continue antibiotics -Advance diet as tolerated, on regular diet this morning but does not like hospital food -Continue to monitor  Active Problems Paroxysmal A. fib -Patient is on aspirin, continue, continue metoprolol for rate control.  No significant events  Hypertension -Continue Norvasc, metoprolol.  Blood pressure stable  Hypothyroidism -continue Synthroid  Scheduled Meds: . amLODipine  2.5 mg Oral Daily  . aspirin EC  81 mg Oral Daily  . enoxaparin (LOVENOX) injection  30 mg Subcutaneous Daily  . levothyroxine  50 mcg Oral QAC breakfast  . metoprolol succinate  25 mg Oral Daily  . zolpidem  2.5 mg Oral QHS   Continuous Infusions: . sodium chloride 250 mL (01/27/19 0954)  . piperacillin-tazobactam (ZOSYN)  IV 2.25 g (01/27/19 0511)   PRN Meds:.sodium chloride, acetaminophen **OR** acetaminophen, HYDROmorphone (DILAUDID) injection, ondansetron (ZOFRAN) IV  DVT prophylaxis:  lovenox Code Status: DNR Family Communication: no family at bedside, discussed with daughter over the phone Disposition Plan: home when ready   Consultants:  None   Procedures:  None   Microbiology  None   Antimicrobials: Zosyn 12/26 >>    Objective: Vitals:   01/26/19 0530 01/26/19 1334 01/26/19 2127 01/27/19 0428  BP: (!) 121/59 (!) 127/55 (!) 116/58 139/64  Pulse: 70 65 74 67  Resp: 17 18 20 20   Temp: 97.7 F (36.5 C) (!) 97.5 F (36.4 C) 98.5 F (36.9 C) (!) 97.4 F (36.3 C)  TempSrc: Oral Oral    SpO2: 97% 99% 97% 94%  Weight:    38 kg  Height:        Intake/Output Summary (Last 24 hours) at 01/27/2019 1332 Last data filed at 01/27/2019 [id number] Gross per 24 hour  Intake 840 ml  Output 50 ml  Net 790 ml   Filed Weights   01/25/19 0500 01/27/19 0428  Weight: 43.1 kg 38 kg    Examination:  Constitutional: NAD Eyes: No scleral icterus ENMT: mmm Respiratory: CTA bilaterally without wheezing or crackles Cardiovascular: rrr, no peripheral edema Abdomen: Soft, NT, ND, bowel sounds positive Musculoskeletal: no clubbing / cyanosis.  Skin: No new rashes Neurologic: No focal deficits, equal strength Psychiatric: Normal judgment and insight. Alert and oriented x 3. Normal mood.    Data Reviewed: I have independently reviewed following labs and imaging studies   CBC: Recent Labs  Lab 01/24/19 2117 01/25/19 1623 01/26/19 0601 01/27/19 0520  WBC 20.4* 35.7* 24.6* 19.1*  NEUTROABS 15.0* 23.3* 17.3*  --   HGB 15.5* 12.8 11.4* 11.2*  HCT 47.1* 40.2 36.0 34.5*  MCV 90.6 94.1 94.2 93.5  PLT 239 206 178 [id number]   Basic Metabolic Panel: Recent Labs  Lab 01/24/19 2117 01/26/19 0601 01/27/19 0520  NA 133* 134* 133*  K 4.1 4.2 3.7  CL 98 100 104  CO2 21* 18* 21*  GLUCOSE 111* 82 89  BUN 22 21 16   CREATININE 1.14* 1.18* 0.94  CALCIUM 9.9 7.7* 8.1*   Liver Function Tests: Recent Labs  Lab 01/24/19 2117 01/27/19 0520  AST 26 28  ALT 20 32  ALKPHOS  59 58  BILITOT 0.9 0.6  PROT 6.9 5.0*  ALBUMIN 4.0 2.7*   Coagulation Profile: No results for input(s): INR, PROTIME in the last 168 hours. HbA1C: No results for input(s): HGBA1C in the last 72 hours. CBG: No results for input(s): GLUCAP in the last 168 hours.  Recent Results (from the past 240 hour(s))  SARS CORONAVIRUS 2 (TAT 6-24 HRS) Nasopharyngeal Nasopharyngeal Swab     Status: None   Collection Time: 01/25/19  3:16 AM   Specimen: Nasopharyngeal Swab  Result Value Ref Range Status   SARS Coronavirus 2 NEGATIVE NEGATIVE Final    Comment: (NOTE) SARS-CoV-2 target nucleic acids are NOT DETECTED. The SARS-CoV-2 RNA is generally detectable in upper and lower respiratory specimens during the acute phase of infection. Negative results do not preclude SARS-CoV-2 infection, do not rule out co-infections with other pathogens, and should not be used as the sole basis for treatment or other patient management decisions. Negative results must be combined with clinical observations, patient history, and epidemiological information. The expected result is Negative. Fact Sheet for Patients: SugarRoll.be Fact Sheet for Healthcare Providers: https://www.woods-mathews.com/ This test is not yet approved or cleared by the [place] FDA and  has been authorized for detection and/or diagnosis of SARS-CoV-2 by FDA under an Emergency Use Authorization (EUA). This EUA will remain  in effect (meaning this test can be used) for the duration of the COVID-19 declaration under Section 56 4(b)(1) of the Act, 21 U.S.C. section 360bbb-3(b)(1), unless the authorization is terminated or revoked sooner. Performed at [facility], [place] [address], [place],  [place] [address]   Culture, blood (routine x 2)     Status: None (Preliminary result)   Collection Time: 01/25/19  5:09 PM   Specimen: BLOOD  Result Value Ref Range Status   Specimen Description    Final    BLOOD LEFT ANTECUBITAL Performed at [facility] [address], [place], [place] [address]    Special Requests   Final    BOTTLES DRAWN AEROBIC ONLY Blood Culture adequate volume Performed at [facility] [address] [place], [place] [address]    Culture   Final    NO GROWTH < 12 HOURS Performed at [facility] [address], [place], [place] [address]    Report Status PENDING  Incomplete  Culture, blood (routine x 2)     Status: None (Preliminary result)   Collection Time: 01/25/19  5:09 PM   Specimen: BLOOD LEFT HAND  Result Value Ref Range Status   Specimen Description   Final    BLOOD LEFT HAND Performed at [facility] [address] [place], [place] [address]    Special Requests   Final    BOTTLES DRAWN AEROBIC ONLY Blood Culture adequate volume Performed at [facility] [address] [place], [place] [address]    Culture   Final    NO GROWTH < 12  HOURS Performed at [facility], [place] [address] [place], [place] [address]    Report Status PENDING  Incomplete     Radiology Studies: No results found. Marzetta Board, MD, PhD [facility]  Between 7 am - 7 pm I am available, please contact me via Amion or Securechat  Between 7 pm - 7 am I am not available, please contact night coverage MD/APP via Amion

## 2019-01-28 LAB — CBC
HCT: 38.5 % (ref 36.0–46.0)
Hemoglobin: 12.7 g/dL (ref 12.0–15.0)
MCH: 30.4 pg (ref 26.0–34.0)
MCHC: 33 g/dL (ref 30.0–36.0)
MCV: 92.1 fL (ref 80.0–100.0)
Platelets: 203 10*3/uL (ref 150–400)
RBC: 4.18 MIL/uL (ref 3.87–5.11)
RDW: 14.7 % (ref 11.5–15.5)
WBC: 13.3 10*3/uL — ABNORMAL HIGH (ref 4.0–10.5)
nRBC: 0 % (ref 0.0–0.2)

## 2019-01-28 LAB — BASIC METABOLIC PANEL
Anion gap: 6 (ref 5–15)
BUN: 11 mg/dL (ref 8–23)
CO2: 24 mmol/L (ref 22–32)
Calcium: 8.3 mg/dL — ABNORMAL LOW (ref 8.9–10.3)
Chloride: 104 mmol/L (ref 98–111)
Creatinine, Ser: 0.89 mg/dL (ref 0.44–1.00)
GFR calc Af Amer: >60 mL/min (ref >60)
GFR calc non Af Amer: 55 mL/min — ABNORMAL LOW (ref >60)
Glucose, Bld: 99 mg/dL (ref 70–99)
Potassium: 3.4 mmol/L — ABNORMAL LOW (ref 3.5–5.1)
Sodium: 134 mmol/L — ABNORMAL LOW (ref 135–145)

## 2019-01-28 MED ORDER — POTASSIUM CHLORIDE 20 MEQ/15ML (10%) PO SOLN
30.0000 meq | Freq: Once | ORAL | Status: AC
  Administered 2019-01-28: 10:00:00 30 meq via ORAL
  Filled 2019-01-28: qty 30

## 2019-01-28 MED ORDER — ALUM & MAG HYDROXIDE-SIMETH 200-200-20 MG/5ML PO SUSP
15.0000 mL | Freq: Four times a day (QID) | ORAL | Status: DC | PRN
  Administered 2019-01-28: 10:00:00 15 mL via ORAL
  Filled 2019-01-28: qty 30

## 2019-01-28 NOTE — Care Management Important Message (Signed)
Important Message  Patient Details IM Letter given to Kathrin Greathouse SW Case Manager to present to the Patient Name: Gina Terry MRN: [id number] Date of Birth: 03-14-1921   Medicare Important Message Given:  Yes     Kerin Salen 01/28/2019, 11:00 AM

## 2019-01-28 NOTE — Progress Notes (Signed)
Occupational Therapy Treatment Patient Details Name: Gina Terry MRN: [id number] DOB: 1921-05-28 Today'Terry Date: 01/28/2019    History of present illness Patient is a 85 year old female with history of hypertension, proximal A. fib, hypothyroidism, compression fracture of the spine, GERD, diverticulosis,irritable bowel syndrome who presented with abdomen pain associated with intermittent diarrhea alternating with constipation.Abd/pelvis CT showed circumferential edematous mural thickening of the distal transverse, splenic flexure, descending and proximal sigmoid colon consistent with colitis of infectious, inflammatory or vascularetiology.  Diverticulosis without finding of diverticulitis   OT comments  Performed ADL and toilet transfer.  Pt states she has almost 24/7 anyway (all but 6 hours). She was getting to toilet with supervision prior to admission.  Much steadier with RW  Follow Up Recommendations  Home health OT;Supervision/Assistance - 24 hour    Equipment Recommendations  None recommended by OT    Recommendations for Other Services      Precautions / Restrictions Precautions Precautions: Fall Restrictions Weight Bearing Restrictions: No       Mobility Bed Mobility     Rolling: Min assist Sidelying to sit: Min assist          Transfers   Equipment used: Rolling walker (2 wheeled)   Sit to Stand: Min assist;Mod assist Stand pivot transfers: Min assist;Mod assist       General transfer comment: min A with RW and mod without.  Youth walker was brought in midway through session    Balance                                           ADL either performed or assessed with clinical judgement   ADL   Eating/Feeding: Set up   Grooming: Wash/dry hands;Wash/dry face;Set up   Upper Body Bathing: Set up   Lower Body Bathing: Moderate assistance   Upper Body Dressing : Set up       Toilet Transfer: Minimal assistance;Moderate  assistance;Stand-pivot;BSC;RW   Toileting- Clothing Manipulation and Hygiene: Maximal assistance;Sit to/from stand               Vision       Perception     Praxis      Cognition Arousal/Alertness: Awake/alert Behavior During Therapy: WFL for tasks assessed/performed Overall Cognitive Status: Within Functional Limits for tasks assessed                                          Exercises     Shoulder Instructions       General Comments      Pertinent Vitals/ Pain       Pain Assessment: Faces Faces Pain Scale: Hurts a little bit Pain Location: lower back Pain Descriptors / Indicators: Discomfort Pain Intervention(Terry): Limited activity within patient'Terry tolerance;Monitored during session;Repositioned  Home Living                                          Prior Functioning/Environment              Frequency  Min 2X/week        Progress Toward Goals  OT Goals(current goals can now be found in the care plan section)  Progress towards OT  goals: Progressing toward goals     Plan      Co-evaluation                 AM-PAC OT "6 Clicks" Daily Activity     Outcome Measure   Help from another person eating meals?: None Help from another person taking care of personal grooming?: A Little Help from another person toileting, which includes using toliet, bedpan, or urinal?: A Lot Help from another person bathing (including washing, rinsing, drying)?: A Lot Help from another person to put on and taking off regular upper body clothing?: A Little Help from another person to put on and taking off regular lower body clothing?: A Lot 6 Click Score: 16    End of Session    OT Visit Diagnosis: Unsteadiness on feet (R26.81);Muscle weakness (generalized) (M62.81);History of falling (Z91.81)   Activity Tolerance Patient tolerated treatment well   Patient Left in chair;with call bell/phone within reach   Nurse  Communication          Time: [id number] OT Time Calculation (min): 37 min  Charges: OT General Charges $OT Visit: 1 Visit OT Treatments $Self Care/Home Management : 23-37 mins  Gina Terry, OTR/L Acute Rehabilitation Services 01/28/2019   Gina Terry 01/28/2019, 10:02 AM

## 2019-01-28 NOTE — Progress Notes (Signed)
PROGRESS NOTE  Gina Terry [id number] DOB: 1921-10-16 DOA: 01/24/2019 PCP: Lorne Skeens, MD   LOS: 3 days   Brief Narrative / Interim history: 85 year old female with HTN, paroxysmal A. fib, hypothyroidism, compression fractures of the spine, GERD, diverticulosis, IBS who came in with intermittent abdominal pain with diarrhea alternating with constipation.  CT scan of the abdomen and pelvis showed in the ER showed findings concerning for colitis of the distal transverse, splenic flexure and descending and proximal sigmoid colon.  She was placed on IV fluids and IV antibiotics and was admitted to the hospital.  No diarrhea since admission and C. difficile was not sent  Subjective / 24h Interval events: Feeling weak but better.  Has not been eating because she does not like hospital food.  Is not "seasoned right"  Assessment & Plan: Principal Problem Abdominal pain secondary to colitis -Patient started on Zosyn, clinically continued improvement, abdominal pain almost gone, she is barely eating still but appetite is back. -Paolucci count was normalized, continue IV antibiotics for 1 more day and potentially can transition to oral tomorrow  Active Problems Paroxysmal A. fib -Patient is on aspirin, continue, continue metoprolol for rate control.  No significant events  Hypertension -Continue Norvasc, metoprolol.  Blood pressure stable  Hypothyroidism -continue Synthroid  Scheduled Meds: . amLODipine  2.5 mg Oral Daily  . aspirin EC  81 mg Oral Daily  . enoxaparin (LOVENOX) injection  30 mg Subcutaneous Daily  . levothyroxine  50 mcg Oral QAC breakfast  . metoprolol succinate  25 mg Oral Daily  . zolpidem  2.5 mg Oral QHS   Continuous Infusions: . sodium chloride 250 mL (01/28/19 1459)  . piperacillin-tazobactam (ZOSYN)  IV 2.25 g (01/28/19 1500)   PRN Meds:.sodium chloride, acetaminophen **OR** acetaminophen, alum & mag hydroxide-simeth, HYDROmorphone (DILAUDID)  injection, ondansetron (ZOFRAN) IV  DVT prophylaxis: lovenox Code Status: DNR Family Communication: no family at bedside, discussed with daughter over the phone Disposition Plan: home when ready   Consultants:  None   Procedures:  None   Microbiology  None   Antimicrobials: Zosyn 12/26 >>    Objective: Vitals:   01/27/19 1351 01/27/19 2041 01/28/19 0414 01/28/19 1354  BP: (!) 143/55 108/73 (!) 159/76 130/67  Pulse: 69 84 73 74  Resp: 16 16 16 17   Temp: (!) 97.5 F (36.4 C) 98.4 F (36.9 C) 97.9 F (36.6 C) (!) 97.5 F (36.4 C)  TempSrc: Oral Oral Oral Oral  SpO2: 92% 94% 92% 94%  Weight:      Height:        Intake/Output Summary (Last 24 hours) at 01/28/2019 1510 Last data filed at 01/28/2019 0900 Gross per 24 hour  Intake 114.93 ml  Output 1400 ml  Net -1285.07 ml   Filed Weights   01/25/19 0500 01/27/19 0428  Weight: 43.1 kg 38 kg    Examination:  Constitutional: No distress Eyes: No icterus ENMT: Moist mucous membranes Respiratory: Clear bilaterally without wheezing or crackles Cardiovascular: Regular rate and rhythm, no edema Abdomen: Soft, nontender, nondistended, positive bowel sounds Musculoskeletal: no clubbing / cyanosis.  Skin: No rash seen Neurologic: No focal deficits, equal strength Psychiatric: Normal judgment and insight. Alert and oriented x 3. Normal mood.    Data Reviewed: I have independently reviewed following labs and imaging studies   CBC: Recent Labs  Lab 01/24/19 2117 01/25/19 1623 01/26/19 0601 01/27/19 0520 01/28/19 0542  WBC 20.4* 35.7* 24.6* 19.1* 13.3*  NEUTROABS 15.0* 23.3* 17.3*  --   --  HGB 15.5* 12.8 11.4* 11.2* 12.7  HCT 47.1* 40.2 36.0 34.5* 38.5  MCV 90.6 94.1 94.2 93.5 92.1  PLT 239 206 178 177 [id number]   Basic Metabolic Panel: Recent Labs  Lab 01/24/19 2117 01/26/19 0601 01/27/19 0520 01/28/19 0542  NA 133* 134* 133* 134*  K 4.1 4.2 3.7 3.4*  CL 98 100 104 104  CO2 21* 18* 21* 24  GLUCOSE  111* 82 89 99  BUN 22 21 16 11   CREATININE 1.14* 1.18* 0.94 0.89  CALCIUM 9.9 7.7* 8.1* 8.3*   Liver Function Tests: Recent Labs  Lab 01/24/19 2117 01/27/19 0520  AST 26 28  ALT 20 32  ALKPHOS 59 58  BILITOT 0.9 0.6  PROT 6.9 5.0*  ALBUMIN 4.0 2.7*   Coagulation Profile: No results for input(s): INR, PROTIME in the last 168 hours. HbA1C: No results for input(s): HGBA1C in the last 72 hours. CBG: No results for input(s): GLUCAP in the last 168 hours.  Recent Results (from the past 240 hour(s))  SARS CORONAVIRUS 2 (TAT 6-24 HRS) Nasopharyngeal Nasopharyngeal Swab     Status: None   Collection Time: 01/25/19  3:16 AM   Specimen: Nasopharyngeal Swab  Result Value Ref Range Status   SARS Coronavirus 2 NEGATIVE NEGATIVE Final    Comment: (NOTE) SARS-CoV-2 target nucleic acids are NOT DETECTED. The SARS-CoV-2 RNA is generally detectable in upper and lower respiratory specimens during the acute phase of infection. Negative results do not preclude SARS-CoV-2 infection, do not rule out co-infections with other pathogens, and should not be used as the sole basis for treatment or other patient management decisions. Negative results must be combined with clinical observations, patient history, and epidemiological information. The expected result is Negative. Fact Sheet for Patients: SugarRoll.be Fact Sheet for Healthcare Providers: https://www.woods-mathews.com/ This test is not yet approved or cleared by the [place] FDA and  has been authorized for detection and/or diagnosis of SARS-CoV-2 by FDA under an Emergency Use Authorization (EUA). This EUA will remain  in effect (meaning this test can be used) for the duration of the COVID-19 declaration under Section 56 4(b)(1) of the Act, 21 U.S.C. section 360bbb-3(b)(1), unless the authorization is terminated or revoked sooner. Performed at [facility], [place] [address]  [place], [place] [address]   Culture, blood (routine x 2)     Status: None (Preliminary result)   Collection Time: 01/25/19  5:09 PM   Specimen: BLOOD  Result Value Ref Range Status   Specimen Description   Final    BLOOD LEFT ANTECUBITAL Performed at [facility] [address], [place], [place] [address]    Special Requests   Final    BOTTLES DRAWN AEROBIC ONLY Blood Culture adequate volume Performed at [facility] [address], [place], [place] [address]    Culture   Final    NO GROWTH 3 DAYS Performed at [facility], [place] [address], [place], [place] [address]    Report Status PENDING  Incomplete  Culture, blood (routine x 2)     Status: None (Preliminary result)   Collection Time: 01/25/19  5:09 PM   Specimen: BLOOD LEFT HAND  Result Value Ref Range Status   Specimen Description   Final    BLOOD LEFT HAND Performed at [facility] [address] [place], [place] [address]    Special Requests   Final    BOTTLES DRAWN AEROBIC ONLY Blood Culture adequate volume Performed at  [facility], [place] [address] [place], [place] [address]    Culture   Final    NO GROWTH 3 DAYS Performed at [facility], [place] [address] [place],  [address]    Report Status PENDING  Incomplete     Radiology Studies: No results found. Marzetta Board, MD, PhD [facility]  Between 7 am - 7 pm I am available, please contact me via Amion or Securechat  Between 7 pm - 7 am I am not available, please contact night coverage MD/APP via Amion

## 2019-01-28 NOTE — Progress Notes (Signed)
Physical Therapy Treatment Patient Details Name: Gina Terry MRN: [id number] DOB: 10-26-1921 Today's Date: 01/28/2019    History of Present Illness Patient is a 85 year old female with history of hypertension, proximal A. fib, hypothyroidism, compression fracture of the spine, GERD, diverticulosis,irritable bowel syndrome who presented with abdomen pain associated with intermittent diarrhea alternating with constipation.Abd/pelvis CT showed circumferential edematous mural thickening of the distal transverse, splenic flexure, descending and proximal sigmoid colon consistent with colitis of infectious, inflammatory or vascularetiology.  Diverticulosis without finding of diverticulitis    PT Comments    Progressing slowly with mobility. Pt continues to require at least Min assist for mobility. She walked ~50 feet with use of a RW. Pt remains weak and she fatigues fairly easily with activity. Discussed d/c plan-pt stated she thinks her daughter is working on setting up some in home care. Unsure if pt will be able to manage at home without some in home assistance. May need to consider SNF placement if in home care arrangements cannot be made.     Follow Up Recommendations  Home health PT;Supervision/Assistance - 24 hour;Home health Aide     Equipment Recommendations  (encouraged pt to f/u with PCP and orthotist for evaluation for R AFO)    Recommendations for Other Services       Precautions / Restrictions Precautions Precautions: Fall Precaution Comments: R foot drop Restrictions Weight Bearing Restrictions: No    Mobility  Bed Mobility     Rolling: Min assist Sidelying to sit: Min assist       General bed mobility comments: oob in recliner  Transfers Overall transfer level: Needs assistance Equipment used: Rolling walker (2 wheeled) Transfers: Sit to/from Stand Sit to Stand: Min assist Stand pivot transfers: Min assist;Mod assist       General transfer comment:  Assist to rise, stabilize, control descent. VCs safety, technique, hand placement. Increased time.  Ambulation/Gait Ambulation/Gait assistance: Min assist Gait Distance (Feet): 50 Feet Assistive device: Rolling walker (2 wheeled) Gait Pattern/deviations: Step-to pattern;Steppage;Decreased dorsiflexion - right;Trunk flexed     General Gait Details: Assist to stabilize pt throughout distance. 2 brief standing rest breaks 2* fatigue. Dyspnea 2/4.   Stairs             Wheelchair Mobility    Modified Rankin (Stroke Patients Only)       Balance Overall balance assessment: Needs assistance         Standing balance support: Bilateral upper extremity supported Standing balance-Leahy Scale: Poor                              Cognition Arousal/Alertness: Awake/alert Behavior During Therapy: WFL for tasks assessed/performed Overall Cognitive Status: Within Functional Limits for tasks assessed                                        Exercises      General Comments        Pertinent Vitals/Pain Pain Assessment: 0-10 Pain Score: 5  Faces Pain Scale: Hurts a little bit Pain Location: back, arch of R foot Pain Descriptors / Indicators: Discomfort Pain Intervention(s): Limited activity within patient's tolerance;Monitored during session    Home Living                      Prior Function  PT Goals (current goals can now be found in the care plan section) Progress towards PT goals: Progressing toward goals    Frequency    Min 3X/week      PT Plan Current plan remains appropriate    Co-evaluation              AM-PAC PT "6 Clicks" Mobility   Outcome Measure  Help needed turning from your back to your side while in a flat bed without using bedrails?: A Little Help needed moving from lying on your back to sitting on the side of a flat bed without using bedrails?: A Little Help needed moving to and from a  bed to a chair (including a wheelchair)?: A Little Help needed standing up from a chair using your arms (e.g., wheelchair or bedside chair)?: A Little Help needed to walk in hospital room?: A Little Help needed climbing 3-5 steps with a railing? : A Lot 6 Click Score: 17    End of Session Equipment Utilized During Treatment: Gait belt Activity Tolerance: Patient limited by fatigue Patient left: in chair;with call bell/phone within reach;with chair alarm set   PT Visit Diagnosis: Muscle weakness (generalized) (M62.81);Difficulty in walking, not elsewhere classified (R26.2)     Time: 1026-1040 PT Time Calculation (min) (ACUTE ONLY): 14 min  Charges:  $Gait Training: 8-22 mins              Doreatha Massed, PT Acute Rehabilitation

## 2019-01-29 DIAGNOSIS — R0602 Shortness of breath: Secondary | ICD-10-CM

## 2019-01-29 LAB — CBC
HCT: 37 % (ref 36.0–46.0)
Hemoglobin: 12.2 g/dL (ref 12.0–15.0)
MCH: 30.5 pg (ref 26.0–34.0)
MCHC: 33 g/dL (ref 30.0–36.0)
MCV: 92.5 fL (ref 80.0–100.0)
Platelets: 209 10*3/uL (ref 150–400)
RBC: 4 MIL/uL (ref 3.87–5.11)
RDW: 14.6 % (ref 11.5–15.5)
WBC: 13 10*3/uL — ABNORMAL HIGH (ref 4.0–10.5)
nRBC: 0 % (ref 0.0–0.2)

## 2019-01-29 LAB — BASIC METABOLIC PANEL
Anion gap: 7 (ref 5–15)
BUN: 12 mg/dL (ref 8–23)
CO2: 24 mmol/L (ref 22–32)
Calcium: 8.2 mg/dL — ABNORMAL LOW (ref 8.9–10.3)
Chloride: 103 mmol/L (ref 98–111)
Creatinine, Ser: 0.82 mg/dL (ref 0.44–1.00)
GFR calc Af Amer: >60 mL/min (ref >60)
GFR calc non Af Amer: >60 mL/min (ref >60)
Glucose, Bld: 96 mg/dL (ref 70–99)
Potassium: 3.7 mmol/L (ref 3.5–5.1)
Sodium: 134 mmol/L — ABNORMAL LOW (ref 135–145)

## 2019-01-29 MED ORDER — CEFUROXIME AXETIL 250 MG PO TABS: 250.0000 mg | ORAL_TABLET | Freq: Two times a day (BID) | ORAL | 0 refills | Status: DC

## 2019-01-29 MED ORDER — CEFUROXIME AXETIL 250 MG PO TABS: 250.0000 mg | ORAL_TABLET | Freq: Two times a day (BID) | ORAL | 0 refills | Status: AC

## 2019-01-29 NOTE — Discharge Summary (Signed)
Physician Discharge Summary  Gina Terry [id number] DOB: [birth year] DOA: 01/24/2019  PCP: Lorne Skeens, MD  Admit date: 01/24/2019 Discharge date: 01/29/2019  Admitted From: home Disposition:  home  Recommendations for Outpatient Follow-up:  1. Follow up with PCP in 1-2 weeks  Home Health: PT, OT Equipment/Devices: none  Discharge Condition: stable CODE STATUS: DNR Diet recommendation: regular  HPI: Per admitting MD,  Gina Terry  is a 85 y.o. female,  w hypertension, LBBB, Pafib, hypothyroidism, osteoporosis, compression fracture of spine,  gerd, diverticulosis, apparently presents w abdominal pain intermittently for the past several months associated w intermittent diarrhea alternating with constipation.  Pt states that her abdominal pain is sharp, crampy and worse over the past 1-2 days.  Pt notes slight dry heaving.  Pt notes constipation, pt denies fever, chills, diarrhea, brbpr, black stool.  Pt presented due to her abdominal pain, and constipation. Pt states had colonoscopy about 10-12 years ago. 05/14/2007  -> diverticulosis.  In ED,  T 97.5, P 71 R 17, Bp 159/72  Pox 97% on RA  CT abd/ pelvis IMPRESSION: 1. Circumferential edematous mural thickening of the distal transverse, splenic flexure, descending and proximal sigmoid colon consistent with colitis of infectious, inflammatory or vascular etiology. 2. Colonic diverticulosis without evidence for diverticulitis. 3. Diffuse body wall edema. 4. Aortic Atherosclerosis (ICD10-I70.0). Wbc 20.4, hgb 15.5, Plt 239 Na 133, K 4.1, Bun 22, Creatinine 1.14 Ast 26, Alt 20 Lactic acid 1.6 Urinalysis pending covid -19 pending Pt will be admitted for colitis w leukocytosis.    Hospital Course / Discharge diagnoses: Abdominal pain secondary to colitis -this is presumed infectious in nature given leukocytosis.  Patient started on Zosyn, clinically continued improvement, abdominal pain resolved, her diet was advanced and she is able  to tolerate a regular diet.  She was having normal bowel movements and with clinical improvement her antibiotics were transitioned to oral and she was discharged home in stable condition.  She is to complete 3 additional days of antibiotics for a total of 7-day course.  Active Problems Paroxysmal A. Fib -Patient is on aspirin, continue, continue metoprolol for rate control.  No significant events  Hypertension -Continue Norvasc, metoprolol.  Blood pressure stable  Hypothyroidism -continue Synthroid  Discharge Instructions   Allergies as of 01/29/2019      Reactions   Rofecoxib    Other reaction(s): Other (See Comments) reflux, edema    Sulfamethoxazole-trimethoprim Nausea Only   Codeine Other (See Comments)   unknown Other reaction(s): GI Upset (intolerance)   Estradiol Rash   Local rash   Flurbiprofen    Other reaction(s): GI Upset (intolerance)   Ibuprofen    Other reaction(s): GI Upset (intolerance)      Medication List    TAKE these medications   acetaminophen 500 MG tablet Commonly known as: TYLENOL Take 500 mg by mouth every 6 (six) hours as needed for mild pain.   Ambien 5 MG tablet Generic drug: zolpidem Take 2.5 mg by mouth at bedtime.   amLODipine 2.5 MG tablet Commonly known as: NORVASC Take 1 tablet (2.5 mg total) by mouth daily.   aspirin 81 MG tablet Take 81 mg by mouth daily.   cefUROXime 250 MG tablet Commonly known as: Ceftin Take 1 tablet (250 mg total) by mouth 2 (two) times daily for 3 days.   docusate sodium 100 MG capsule Commonly known as: COLACE Take 200 mg by mouth daily as needed for mild constipation.   ibuprofen 200 MG tablet Commonly known  as: ADVIL Take 200 mg by mouth every 6 (six) hours as needed.   levothyroxine 50 MCG tablet Commonly known as: SYNTHROID Take 50 mcg by mouth daily before breakfast.   metoprolol succinate 25 MG 24 hr tablet Commonly known as: Toprol XL Take 1 tablet (25 mg total) by mouth daily.     nitroGLYCERIN 0.4 MG SL tablet Commonly known as: NITROSTAT Place 1 tablet (0.4 mg total) under the tongue every 5 (five) minutes as needed for chest pain.   traMADol 50 MG tablet Commonly known as: ULTRAM Take 25-50 mg by mouth every 8 (eight) hours as needed.        Consultations:  None   Procedures/Studies:  DG Chest 1 View  Result Date: 01/25/2019 CLINICAL DATA:  85 year old with acute onset of shortness of breath. EXAM: Portable CHEST 1 VIEW COMPARISON:  05/05/2008. FINDINGS: Cardiac silhouette moderately enlarged, increased in size since 2010. Thoracic aorta atherosclerotic and tortuous. Hilar and mediastinal contours otherwise unremarkable. Mildly prominent bronchovascular markings diffusely. Airspace consolidation with apparent air bronchograms medially at the LEFT lung base. Lungs otherwise clear. Pulmonary vascularity normal. No visible pleural effusions. IMPRESSION: 1. Acute pneumonia involving the LEFT lower lobe. 2. Moderate cardiomegaly, increased in size since 2010, without evidence of pulmonary edema. Electronically Signed   By: Evangeline Dakin M.D.   On: 01/25/2019 19:25   CT Abdomen Pelvis W Contrast  Result Date: 01/25/2019 CLINICAL DATA:  Generalized abdominal pain and tenderness, history of atrial fibrillation EXAM: CT ABDOMEN AND PELVIS WITH CONTRAST TECHNIQUE: Multidetector CT imaging of the abdomen and pelvis was performed using the standard protocol following bolus administration of intravenous contrast. CONTRAST:  73mL OMNIPAQUE IOHEXOL 300 MG/ML  SOLN COMPARISON:  CT abdomen pelvis July 23, 2007 FINDINGS: Lower chest: There are dense bandlike areas of basilar scarring and or atelectasis. With similar features in the lingula as well. Mild cardiomegaly. No pericardial effusion. Hepatobiliary: No focal liver abnormality is seen. Patient is post cholecystectomy. Slight prominence of the biliary tree likely related to reservoir effect. No calcified intraductal  gallstones. Pancreas: Unremarkable. No pancreatic ductal dilatation or surrounding inflammatory changes. Spleen: Normal in size without focal abnormality. Adrenals/Urinary Tract: Scattered subcentimeter hypoattenuating foci throughout both kidneys are too small to fully characterize on CT imaging but statistically likely benign. A left extrarenal pelvis is similar to comparison. No hydronephrosis or urolithiasis. No worrisome renal lesions. Urinary bladder is unremarkable. Stomach/Bowel: Distal esophagus, stomach and duodenal sweep are unremarkable. No small bowel wall thickening or dilatation. No evidence of obstruction. The appendix is surgically absent. The colon is markedly redundant with much of the transverse colon displaced to the low anterior pelvis. There is circumferential edematous mural thickening of the distal transverse, splenic flexure, descending and proximal sigmoid colon innumerable sigmoid colonic diverticular are present without focal diverticular inflammation. Vascular/Lymphatic: Atherosclerotic plaque within the normal caliber aorta. No suspicious or enlarged lymph nodes in the included lymphatic chains. Reproductive: Diminutive, atrophic appearance of the uterus, expected for patient age. No concerning adnexal lesions. Other: Diffuse circumferential body wall edema is noted. Increased attenuation of the mesentery likely reflecting some mild third-spacing of fluid. No bowel containing hernias. Musculoskeletal: Multilevel degenerative changes are present in the imaged portions of the spine. No acute osseous abnormality or suspicious osseous lesion. The osseous structures appear diffusely demineralized which may limit detection of small or nondisplaced fractures. IMPRESSION: 1. Circumferential edematous mural thickening of the distal transverse, splenic flexure, descending and proximal sigmoid colon consistent with colitis of infectious, inflammatory or vascular  etiology. 2. Colonic  diverticulosis without evidence for diverticulitis. 3. Diffuse body wall edema. 4. Aortic Atherosclerosis (ICD10-I70.0). Electronically Signed   By: Lovena Le M.D.   On: 01/25/2019 01:47      Subjective: - no chest pain, shortness of breath, no abdominal pain, nausea or vomiting.   Discharge Exam: BP (!) 143/61 (BP Location: Right Arm)   Pulse 75   Temp 98.2 F (36.8 C) (Oral)   Resp 16   Ht 5\' 2"  (1.575 m)   Wt 38 kg   SpO2 91%   BMI 15.32 kg/m   General: Pt is alert, awake, not in acute distress Cardiovascular: RRR, S1/S2 +, no rubs, no gallops Respiratory: CTA bilaterally, no wheezing, no rhonchi Abdominal: Soft, NT, ND, bowel sounds + Extremities: no edema, no cyanosis    The results of significant diagnostics from this hospitalization (including imaging, microbiology, ancillary and laboratory) are listed below for reference.     Microbiology: Recent Results (from the past 240 hour(s))  SARS CORONAVIRUS 2 (TAT 6-24 HRS) Nasopharyngeal Nasopharyngeal Swab     Status: None   Collection Time: 01/25/19  3:16 AM   Specimen: Nasopharyngeal Swab  Result Value Ref Range Status   SARS Coronavirus 2 NEGATIVE NEGATIVE Final    Comment: (NOTE) SARS-CoV-2 target nucleic acids are NOT DETECTED. The SARS-CoV-2 RNA is generally detectable in upper and lower respiratory specimens during the acute phase of infection. Negative results do not preclude SARS-CoV-2 infection, do not rule out co-infections with other pathogens, and should not be used as the sole basis for treatment or other patient management decisions. Negative results must be combined with clinical observations, patient history, and epidemiological information. The expected result is Negative. Fact Sheet for Patients: SugarRoll.be Fact Sheet for Healthcare Providers: https://www.woods-mathews.com/ This test is not yet approved or cleared by the [place] FDA and  has  been authorized for detection and/or diagnosis of SARS-CoV-2 by FDA under an Emergency Use Authorization (EUA). This EUA will remain  in effect (meaning this test can be used) for the duration of the COVID-19 declaration under Section 56 4(b)(1) of the Act, 21 U.S.C. section 360bbb-3(b)(1), unless the authorization is terminated or revoked sooner. Performed at [facility], [place] [address] [place], [place] [address]   Culture, blood (routine x 2)     Status: None (Preliminary result)   Collection Time: 01/25/19  5:09 PM   Specimen: BLOOD  Result Value Ref Range Status   Specimen Description   Final    BLOOD LEFT ANTECUBITAL Performed at [facility] [address] [place], [place] [address]    Special Requests   Final    BOTTLES DRAWN AEROBIC ONLY Blood Culture adequate volume Performed at [facility] [address], [place], [place] [address]    Culture   Final    NO GROWTH 3 DAYS Performed at [facility], [place] [address] [place], [place] [address]    Report Status PENDING  Incomplete  Culture, blood (routine x 2)     Status: None (Preliminary result)   Collection Time: 01/25/19  5:09 PM   Specimen: BLOOD LEFT HAND  Result Value Ref Range Status   Specimen Description   Final    BLOOD LEFT HAND Performed at [facility] [address], [place], [place] [address]    Special Requests   Final    BOTTLES DRAWN AEROBIC ONLY Blood Culture adequate volume Performed at [facility],  [place] [address], [place], [place] [address]    Culture   Final    NO GROWTH 3 DAYS Performed at [facility], [place] [address] [place], [place] [address]    Report Status PENDING  Incomplete     Labs: Basic Metabolic Panel: Recent Labs  Lab 01/24/19 2117 01/26/19 0601 01/27/19 0520 01/28/19 0542 01/29/19 0607  NA 133* 134* 133* 134* 134*  K 4.1 4.2 3.7 3.4* 3.7  CL 98 100 104 104 103  CO2 21*  18* 21* 24 24  GLUCOSE 111* 82 89 99 96  BUN 22 21 16 11 12   CREATININE 1.14* 1.18* 0.94 0.89 0.82  CALCIUM 9.9 7.7* 8.1* 8.3* 8.2*   Liver Function Tests: Recent Labs  Lab 01/24/19 2117 01/27/19 0520  AST 26 28  ALT 20 32  ALKPHOS 59 58  BILITOT 0.9 0.6  PROT 6.9 5.0*  ALBUMIN 4.0 2.7*   CBC: Recent Labs  Lab 01/24/19 2117 01/25/19 1623 01/26/19 0601 01/27/19 0520 01/28/19 0542 01/29/19 0607  WBC 20.4* 35.7* 24.6* 19.1* 13.3* 13.0*  NEUTROABS 15.0* 23.3* 17.3*  --   --   --   HGB 15.5* 12.8 11.4* 11.2* 12.7 12.2  HCT 47.1* 40.2 36.0 34.5* 38.5 37.0  MCV 90.6 94.1 94.2 93.5 92.1 92.5  PLT 239 206 178 177 203 209   CBG: No results for input(s): GLUCAP in the last 168 hours. Hgb A1c No results for input(s): HGBA1C in the last 72 hours. Lipid Profile No results for input(s): CHOL, HDL, LDLCALC, TRIG, CHOLHDL, LDLDIRECT in the last 72 hours. Thyroid function studies No results for input(s): TSH, T4TOTAL, T3FREE, THYROIDAB in the last 72 hours.  Invalid input(s): FREET3 Urinalysis No results found for: COLORURINE, APPEARANCEUR, LABSPEC, PHURINE, GLUCOSEU, HGBUR, BILIRUBINUR, KETONESUR, PROTEINUR, UROBILINOGEN, NITRITE, LEUKOCYTESUR  FURTHER DISCHARGE INSTRUCTIONS:   Get Medicines reviewed and adjusted: Please take all your medications with you for your next visit with your Primary MD   Laboratory/radiological data: Please request your Primary MD to go over all hospital tests and procedure/radiological results at the follow up, please ask your Primary MD to get all Hospital records sent to his/her office.   In some cases, they will be blood work, cultures and biopsy results pending at the time of your discharge. Please request that your primary care M.D. goes through all the records of your hospital data and follows up on these results.   Also Note the following: If you experience worsening of your admission symptoms, develop shortness of breath, life threatening  emergency, suicidal or homicidal thoughts you must seek medical attention immediately by calling 911 or calling your MD immediately  if symptoms less severe.   You must read complete instructions/literature along with all the possible adverse reactions/side effects for all the Medicines you take and that have been prescribed to you. Take any new Medicines after you have completely understood and accpet all the possible adverse reactions/side effects.    Do not drive when taking Pain medications or sleeping medications (Benzodaizepines)   Do not take more than prescribed Pain, Sleep and Anxiety Medications. It is not advisable to combine anxiety,sleep and pain medications without talking with your primary care practitioner   Special Instructions: If you have smoked or chewed Tobacco  in the last 2 yrs please stop smoking, stop any regular Alcohol  and or any Recreational drug use.   Wear Seat belts while driving.   Please note: You were cared for by a hospitalist during your  hospital stay. Once you are discharged, your primary care physician will handle any further medical issues. Please note that NO REFILLS for any discharge medications will be authorized once you are discharged, as it is imperative that you return to your primary care physician (or establish a relationship with a primary care physician if you do not have one) for your post hospital discharge needs so that they can reassess your need for medications and monitor your lab values.  Time coordinating discharge: 25 minutes  SIGNED:  Marzetta Board, MD, PhD 01/29/2019, 9:07 AM

## 2019-01-29 NOTE — TOC Initial Note (Signed)
Transition of Care [facility]) - Initial/Assessment Note    Patient Details  Name: Gina Terry MRN: [id number] Date of Birth: 1921-09-11  [facility] CM/SW Contact:    Lia Hopping, [place] Phone Number: 01/29/2019, 8:49 AM  Clinical Narrative:                 CSW spoke to the patient at bedside and daughter via phone. Daughter reports the patient will have 24/7 caregivers at discharge. Patient and daughter agreeable to [facility] PT/OT services. CSW provided choice to the daughter she chose [place]. CSW confirm availbility starting Saturday. Patient and daughter agreeable.   Expected Discharge Plan: [place] Barriers to Discharge: No Barriers Identified   Patient Goals and CMS Choice   CMS Medicare.gov Compare Post Acute Care list provided to:: Patient Choice offered to / list presented to : Adult Children  Expected Discharge Plan and Services Expected Discharge Plan: [place] In-house Referral: Clinical Social Work Discharge Planning Services: CM Consult Post Acute Care Choice: [place] arrangements for the past 2 months: [place]: PT, OT   Date HH Agency Contacted: 01/28/19      Prior Living Arrangements/Services Living arrangements for the past 2 months: Single Family Home Lives with:: Self Patient language and need for interpreter reviewed:: No Do you feel safe going back to the place where you live?: Yes      Need for Family Participation in Patient Care: Yes (Comment) Care giver support system in place?: Yes (comment)   Criminal Activity/Legal Involvement Pertinent to Current Situation/Hospitalization: No - Comment as needed  Activities of Daily Living Home Assistive Devices/Equipment: Eyeglasses, Environmental consultant (specify type)(front wheel ) ADL Screening (condition at time of admission) Patient's cognitive ability adequate to safely complete daily  activities?: Yes Is the patient deaf or have difficulty hearing?: No Does the patient have difficulty seeing, even when wearing glasses/contacts?: No Does the patient have difficulty concentrating, remembering, or making decisions?: No Patient able to express need for assistance with ADLs?: Yes Does the patient have difficulty dressing or bathing?: Yes Independently performs ADLs?: No Communication: Independent Dressing (OT): Needs assistance Is this a change from baseline?: Pre-admission baseline Grooming: Needs assistance Is this a change from baseline?: Pre-admission baseline Feeding: Independent Bathing: Needs assistance Is this a change from baseline?: Pre-admission baseline Toileting: Needs assistance Is this a change from baseline?: Pre-admission baseline In/Out Bed: Needs assistance Is this a change from baseline?: Pre-admission baseline Walks in Home: Needs assistance Is this a change from baseline?: Pre-admission baseline Does the patient have difficulty walking or climbing stairs?: Yes Weakness of Legs: Both Weakness of Arms/Hands: None  Permission Sought/Granted Permission sought to share information with : Case Manager Permission granted to share information with : Yes, Verbal Permission Granted        Permission granted to share info w Relationship: Tania Ade Daughter [phone]     Emotional Assessment Appearance:: Appears stated age   Affect (typically observed): Accepting Orientation: : Oriented to Self, Oriented to Place, Oriented to  Time, Oriented to Situation Alcohol / Substance Use: Not Applicable Psych Involvement: No (comment)  Admission diagnosis:  Colitis [K52.9] Patient Active Problem List   Diagnosis Date Noted  . Colitis 01/25/2019  . Chronic bilateral low back pain 04/19/2016  . Gastroesophageal reflux 04/19/2016  .  Hypertension, essential 04/19/2016  . 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 Primary hypothyroidism 04/19/2016  .  Paroxysmal atrial fibrillation (HCC)   . Left bundle branch block   . OTHER CONSTIPATION 07/22/2007  . ABDOMINAL PAIN, EPIGASTRIC 07/22/2007  . GASTRITIS, CHRONIC 05/14/2007  . DIVERTICULOSIS, COLON 05/14/2007   PCP:  Lorne Skeens, MD Pharmacy:   [id number] - [place], [place] [place] [address] Phone: [phone] Fax: [phone]     Social Determinants of Health (SDOH) Interventions    Readmission Risk Interventions No flowsheet data found.

## 2019-01-29 NOTE — Progress Notes (Signed)
Physical Therapy Treatment Patient Details Name: Gina Terry MRN: [id number] DOB: February 06, 1921 Today's Date: 01/29/2019    History of Present Illness Patient is a 85 year old female with history of hypertension, proximal A. fib, hypothyroidism, compression fracture of the spine, GERD, diverticulosis,irritable bowel syndrome who presented with abdomen pain associated with intermittent diarrhea alternating with constipation.Abd/pelvis CT showed circumferential edematous mural thickening of the distal transverse, splenic flexure, descending and proximal sigmoid colon consistent with colitis of infectious, inflammatory or vascularetiology.  Diverticulosis without finding of diverticulitis    PT Comments    Pt continues to require assistance for mobility. She will absolutely need 24/7 care.    Follow Up Recommendations  Home health PT;Supervision/Assistance - 24 hour     Equipment Recommendations       Recommendations for Other Services       Precautions / Restrictions Precautions Precautions: Fall Precaution Comments: R foot drop Restrictions Weight Bearing Restrictions: No    Mobility  Bed Mobility Overal bed mobility: Needs Assistance Bed Mobility: Supine to Sit Rolling: Min assist         General bed mobility comments: Assist for trunk. Increased time.  Transfers Overall transfer level: Needs assistance Equipment used: Rolling walker (2 wheeled) Transfers: Sit to/from Stand Sit to Stand: Min assist;From elevated surface Stand pivot transfers: Min assist       General transfer comment: Assist to rise, stabilize, control descent. VCs safety, technique, hand placement. Increased time. Stand pivot, bed to bsc.  Ambulation/Gait Ambulation/Gait assistance: Min assist Gait Distance (Feet): 35 Feet Assistive device: Rolling walker (2 wheeled) Gait Pattern/deviations: Step-to pattern;Steppage;Decreased dorsiflexion - right;Trunk flexed;Step-through pattern;Decreased  stride length     General Gait Details: Assist to stabilize pt throughout distance. 2 brief standing rest breaks 2* fatigue. Dyspnea 2/4.   Stairs             Wheelchair Mobility    Modified Rankin (Stroke Patients Only)       Balance Overall balance assessment: Needs assistance         Standing balance support: Bilateral upper extremity supported Standing balance-Leahy Scale: Poor                              Cognition Arousal/Alertness: Awake/alert Behavior During Therapy: WFL for tasks assessed/performed Overall Cognitive Status: Within Functional Limits for tasks assessed                                        Exercises      General Comments        Pertinent Vitals/Pain Pain Assessment: Faces Faces Pain Scale: Hurts a little bit Pain Location: back, arch of R foot Pain Descriptors / Indicators: Discomfort;Sore Pain Intervention(s): Limited activity within patient's tolerance;Monitored during session    Home Living                      Prior Function            PT Goals (current goals can now be found in the care plan section) Progress towards PT goals: Progressing toward goals    Frequency    Min 3X/week      PT Plan Current plan remains appropriate    Co-evaluation              AM-PAC PT "6 Clicks" Mobility   Outcome Measure  Help needed turning from your back to your side while in a flat bed without using bedrails?: A Little Help needed moving from lying on your back to sitting on the side of a flat bed without using bedrails?: A Little Help needed moving to and from a bed to a chair (including a wheelchair)?: A Little Help needed standing up from a chair using your arms (e.g., wheelchair or bedside chair)?: A Little Help needed to walk in hospital room?: A Little Help needed climbing 3-5 steps with a railing? : A Lot 6 Click Score: 17    End of Session Equipment Utilized During  Treatment: Gait belt Activity Tolerance: Patient limited by fatigue Patient left: in chair;with call bell/phone within reach   PT Visit Diagnosis: Muscle weakness (generalized) (M62.81);Difficulty in walking, not elsewhere classified (R26.2)     Time: [id number] PT Time Calculation (min) (ACUTE ONLY): 30 min  Charges:  $Gait Training: 8-22 mins $Therapeutic Activity: 8-22 mins                         Doreatha Massed, PT Acute Rehabilitation

## 2019-01-30 LAB — CULTURE, BLOOD (ROUTINE X 2)
Culture: NO GROWTH
Culture: NO GROWTH
Special Requests: ADEQUATE
Special Requests: ADEQUATE

## 2019-02-04 DIAGNOSIS — K2970 Gastritis, unspecified, without bleeding: Secondary | ICD-10-CM | POA: Insufficient documentation

## 2019-02-04 DIAGNOSIS — K529 Noninfective gastroenteritis and colitis, unspecified: Secondary | ICD-10-CM | POA: Insufficient documentation

## 2019-02-12 ENCOUNTER — Ambulatory Visit: Payer: Commercial Indemnity | Admitting: Diagnostic Neuroimaging

## 2019-02-13 NOTE — Progress Notes (Signed)
Virtual Visit via Telephone Note   This visit type was conducted due to national recommendations for restrictions regarding the COVID-19 Pandemic (e.g. social distancing) in an effort to limit this patient's exposure and mitigate transmission in our community.  Due to her co-morbid illnesses, this patient is at least at moderate risk for complications without adequate follow up.  This format is felt to be most appropriate for this patient at this time.  The patient did not have access to video technology/had technical difficulties with video requiring transitioning to audio format only (telephone).  All issues noted in this document were discussed and addressed.  No physical exam could be performed with this format.  Please refer to the patient's chart for her  consent to telehealth for [facility].   Date:  02/19/2019   ID:  Gina Terry, Gina Terry [birth year], MRN [id number]  Patient Location: Home Provider Location: Home  PCP:  Altheimer, Legrand Como, MD  Cardiologist:  Edu On Martinique MD Electrophysiologist:  None   Evaluation Performed:  Follow-Up Visit  Chief Complaint:  Follow up angina  History of Present Illness:    Gina Terry is a 86 y.o. female with a history of LBBB and paroxysmal Afib. She had a normal nuclear stress test in 2011. Echo in past apparently OK. She has been managed with rate control and ASA.   She presented in March 2019 with symptoms of progressive angina. She described chest pain in the mid sternal area radiating into her back and down both arms. Symptoms are relieved with rest. Symptoms have progressed to the point where she gets pain walking from one room to the next or brushing her teeth. She states she was no longer doing her grocery shopping because of this. Denied dyspnea or diaphoresis.  Her last episode of Afib occurred in November 2016 and converted spontaneously.  She still lives independently in home by herself. She has one daughter living in [place] and  a son living in [place]. Not much support system here although she states a neighbor will help her out if needed.   Due to her advanced age an initial conservative approach was pursued. She was started on Toprol XL 50 mg daily and given sl Ntg to use prn. Echo showed normal LV function and mild AS.  We later had to reduce the Toprol to 25 mg daily due to bradycardia with HR in the 40s.   She was admitted in December 2020 with acute colitis that responded to antibiotics. She also had a compression fracture in her back in early December. She has some resultant foot drop. She is still getting PT and has 24 hour care at home. She can only walk with a walker.  The patient does not have symptoms concerning for COVID-19 infection (fever, chills, cough, or new shortness of breath).    Past Medical History:  Diagnosis Date  . Arthritis   . GERD (gastroesophageal reflux disease)   . Hypertension, essential 04/19/2016  . Hypothyroid   . Left bundle branch block   . Osteoporosis   . Paroxysmal atrial fibrillation [facility])    Past Surgical History:  Procedure Laterality Date  . APPENDECTOMY    . CARDIOVASCULAR STRESS TEST  03-24-2009   EF 78%  . CHOLECYSTECTOMY    . TONSILLECTOMY AND ADENOIDECTOMY    . US ECHOCARDIOGRAPHY  05-07-2008   Est EF 50-55%     Current Meds  Medication Sig  . acetaminophen (TYLENOL) 500 MG tablet Take 500 mg by mouth  every 6 (six) hours as needed for mild pain.  Marland Kitchen amLODipine (NORVASC) 2.5 MG tablet Take 1 tablet (2.5 mg total) by mouth daily.  Marland Kitchen aspirin 81 MG tablet Take 81 mg by mouth daily.    Marland Kitchen docusate sodium (COLACE) 100 MG capsule Take 200 mg by mouth daily as needed for mild constipation.  Marland Kitchen levothyroxine (SYNTHROID, LEVOTHROID) 50 MCG tablet Take 50 mcg by mouth daily before breakfast.  . metoprolol succinate (TOPROL XL) 25 MG 24 hr tablet Take 1 tablet (25 mg total) by mouth daily.  . nitroGLYCERIN (NITROSTAT) 0.4 MG SL tablet Place 1 tablet (0.4 mg total) under  the tongue every 5 (five) minutes as needed for chest pain.  . traMADol (ULTRAM) 50 MG tablet Take 25-50 mg by mouth every 8 (eight) hours as needed.  . zolpidem (AMBIEN) 5 MG tablet Take 2.5 mg by mouth at bedtime.      Allergies:   Rofecoxib, Sulfamethoxazole-trimethoprim, Codeine, Estradiol, Flurbiprofen, and Ibuprofen   Social History   Tobacco Use  . Smoking status: Former Smoker    Packs/day: 0.00    Types: Cigarettes    Quit date: 01/31/1984    Years since quitting: 35.0  . Smokeless tobacco: Never Used  Substance Use Topics  . Alcohol use: Yes    Comment: 3 oz of wine before dinner every night  . Drug use: No     Family Hx: The patient's family history includes Cancer in her sister; Heart attack in her father; Hip fracture in her mother; Stroke in her brother.  ROS:   Please see the history of present illness.    All other systems reviewed and are negative.   Prior CV studies:   The following studies were reviewed today:  Echo 04/18/17: Study Conclusions  - Left ventricle: The cavity size was normal. Wall thickness was   increased in a pattern of mild LVH. Septal bounce suggestive of   LBBB, otherwise normal wall motion. Systolic function was normal.   The estimated ejection fraction was in the range of 55% to 60%.   Doppler parameters are consistent with abnormal left ventricular   relaxation (grade 1 diastolic dysfunction). - Aortic valve: Trileaflet; severely calcified leaflets. There was   mild stenosis. Mean gradient (S): 12 mm Hg. Peak gradient (S): 19   mm Hg. Valve area (VTI): 1.86 cm^2. - Mitral valve: Mildly calcified annulus. There was no significant   regurgitation. - Left atrium: The atrium was mildly dilated. - Right ventricle: The cavity size was normal. Systolic function   was normal. - Tricuspid valve: Peak RV-RA gradient (S): 27 mm Hg. - Pulmonary arteries: PA peak pressure: 30 mm Hg (S). - Inferior vena cava: The vessel was normal in size.  The   respirophasic diameter changes were in the normal range (= 50%),   consistent with normal central venous pressure. - Pericardium, extracardiac: A trivial pericardial effusion was   identified.  Labs/Other Tests and Data Reviewed:    EKG:  No ECG reviewed.  Recent Labs: 01/27/2019: ALT 32 01/29/2019: BUN 12; Creatinine, Ser 0.82; Hemoglobin 12.2; Platelets 209; Potassium 3.7; Sodium 134   Recent Lipid Panel Lab Results  Component Value Date/Time   CHOL 165 04/06/2017 02:20 PM   TRIG 69 04/06/2017 02:20 PM   HDL 76 04/06/2017 02:20 PM   CHOLHDL 2.2 05/06/2008 04:25 AM   LDLCALC 75 04/06/2017 02:20 PM    Wt Readings from Last 3 Encounters:  01/27/19 83 lb 12.4 oz (38 kg)  02/14/18 95 lb 6.4 oz (43.3 kg)  08/08/17 97 lb 3.2 oz (44.1 kg)     Objective:    Vital Signs:  There were no vitals taken for this visit.   VITAL SIGNS:  reviewed  ASSESSMENT & PLAN:    1.  Angina pectoris- symptoms are well controlled on beta blocker and amlodipine. Given sl Ntg to use prn and instructed in its use. Bradycardia has improved with reduction in beta blocker dose.  2. Mild Aortic stenosis.  3. Paroxysmal Afib. Now on Toprol XL for rate control.  No symptomatic recurrence since 2016. I reviewed Ecg from the hospital in Dec and she was in NSR.   4. LBBB. Resolved. More recent Ecg shows LVH with QRS widening.    COVID-19 Education: The signs and symptoms of COVID-19 were discussed with the patient and how to seek care for testing (follow up with PCP or arrange E-visit).  The importance of social distancing was discussed today.  Time:   Today, I have spent 10 minutes with the patient with telehealth technology discussing the above problems.     Medication Adjustments/Labs and Tests Ordered: Current medicines are reviewed at length with the patient today.  Concerns regarding medicines are outlined above.   Tests Ordered: No orders of the defined types were placed in this  encounter.   Medication Changes: Meds ordered this encounter  Medications  . nitroGLYCERIN (NITROSTAT) 0.4 MG SL tablet    Sig: Place 1 tablet (0.4 mg total) under the tongue every 5 (five) minutes as needed for chest pain.    Dispense:  25 tablet    Refill:  11    Follow Up:  In Person in 1 year(s)  Signed, Mariem Skolnick Martinique, MD  02/19/2019 9:42 AM    [place]

## 2019-02-19 ENCOUNTER — Encounter: Payer: Self-pay | Admitting: Cardiology

## 2019-02-19 ENCOUNTER — Ambulatory Visit: Payer: Medicare Other | Admitting: Cardiology

## 2019-02-19 ENCOUNTER — Telehealth (INDEPENDENT_AMBULATORY_CARE_PROVIDER_SITE_OTHER): Payer: Medicare Other | Admitting: Cardiology

## 2019-02-19 DIAGNOSIS — I447 Left bundle-branch block, unspecified: Secondary | ICD-10-CM

## 2019-02-19 DIAGNOSIS — I209 Angina pectoris, unspecified: Secondary | ICD-10-CM

## 2019-02-19 DIAGNOSIS — I350 Nonrheumatic aortic (valve) stenosis: Secondary | ICD-10-CM

## 2019-02-19 DIAGNOSIS — I480 Paroxysmal atrial fibrillation: Secondary | ICD-10-CM

## 2019-02-19 MED ORDER — NITROGLYCERIN 0.4 MG SL SUBL: 0.4000 mg | SUBLINGUAL_TABLET | SUBLINGUAL | 11 refills | Status: DC | PRN

## 2019-02-19 NOTE — Patient Instructions (Signed)
Medication Instructions:  Continue same medications *If you need a refill on your cardiac medications before your next appointment, please call your pharmacy*  Lab Work: None ordered   Testing/Procedures: None ordered  Follow-Up: At [facility], you and your health needs are our priority.  As part of our continuing mission to provide you with exceptional heart care, we have created designated Provider Care Teams.  These Care Teams include your primary Cardiologist (physician) and Advanced Practice Providers (APPs -  Physician Assistants and Nurse Practitioners) who all work together to provide you with the care you need, when you need it.  Your next appointment: 1 Year    Call in Oct to schedule Jan appointment    The format for your next appointment:  Office   Provider:  Dr.Jordan

## 2019-05-07 ENCOUNTER — Other Ambulatory Visit: Payer: Self-pay | Admitting: Cardiology

## 2019-06-04 ENCOUNTER — Other Ambulatory Visit: Payer: Self-pay | Admitting: Cardiology

## 2019-11-28 ENCOUNTER — Other Ambulatory Visit: Payer: Self-pay

## 2019-11-28 ENCOUNTER — Ambulatory Visit (INDEPENDENT_AMBULATORY_CARE_PROVIDER_SITE_OTHER): Payer: Medicare Other | Admitting: Podiatry

## 2019-11-28 VITALS — Temp 97.8°F

## 2019-11-28 DIAGNOSIS — L03031 Cellulitis of right toe: Secondary | ICD-10-CM

## 2019-11-28 DIAGNOSIS — L600 Ingrowing nail: Secondary | ICD-10-CM | POA: Diagnosis not present

## 2019-11-30 ENCOUNTER — Encounter: Payer: Self-pay | Admitting: Podiatry

## 2019-11-30 NOTE — Progress Notes (Signed)
  Subjective:  Patient ID: KLOEE BALLEW, female    DOB: [birth year],  MRN: [id number]  Chief Complaint  Patient presents with  . Nail Problem    Right 1st lateral border painful and red 1 day duration, pt concerned about infection. Denies fever/nausea/vomiting/chills but states she feels warm.    86 y.o. female presents with the above complaint. History confirmed with patient.  She is here today with assistant from her assisted living facility.  The right hallux nail lateral border has become incurvated and is digging and is causing him to become red and painful.  She asked the left foot maybe as well.  Objective:  Physical Exam: warm, good capillary refill, no trophic changes or ulcerative lesions, normal DP and PT pulses and normal sensory exam.  Incurvated border of right hallux nail tip, mild paronychia, left hallux nail medial border is incurvated  Assessment:   1. Ingrowing right great toenail   2. Ingrowing left great toenail      Plan:  Patient was evaluated and treated and all questions answered.  Following topical anesthesia with ethyl chloride spray, I debrided the incurvated border of the right hallux nail to her tolerance.  This is not required for the left side.  There is no need for antibiotics at this time.   No follow-ups on file.

## 2019-12-01 ENCOUNTER — Ambulatory Visit: Payer: Medicare Other | Admitting: Podiatry

## 2020-01-18 ENCOUNTER — Other Ambulatory Visit: Payer: Self-pay | Admitting: Cardiology

## 2020-04-08 ENCOUNTER — Ambulatory Visit (INDEPENDENT_AMBULATORY_CARE_PROVIDER_SITE_OTHER): Payer: Medicare Other | Admitting: Podiatry

## 2020-04-08 ENCOUNTER — Encounter: Payer: Self-pay | Admitting: Podiatry

## 2020-04-08 ENCOUNTER — Other Ambulatory Visit: Payer: Self-pay

## 2020-04-08 DIAGNOSIS — L03031 Cellulitis of right toe: Secondary | ICD-10-CM | POA: Diagnosis not present

## 2020-04-08 NOTE — Patient Instructions (Signed)
Place 1/4 cup of epsom salts in a quart of warm tap water.  Submerge your foot or feet in the solution and soak for 20 minutes.  This soak should be done twice a day.  Next, remove your foot or feet from solution, blot dry the affected area. Apply ointment and cover if instructed by your doctor.   IF YOUR SKIN BECOMES IRRITATED WHILE USING THESE INSTRUCTIONS, IT IS OKAY TO SWITCH TO  Fazzini VINEGAR AND WATER.  As another alternative soak, you may use antibacterial soap and water.  Monitor for any signs/symptoms of infection. Call the office immediately if any occur or go directly to the emergency room. Call with any questions/concerns.  

## 2020-04-08 NOTE — Progress Notes (Signed)
Subjective:   Patient ID: Gina Terry, female   DOB: 87 y.o.   MRN: [id number]   HPI Patient presents stating she is developed a lot of pain on the inside of her right big toe and it periodically gets sore and it makes it hard for her to wear shoe.  She still is in good mental health 87 years old and presents with caregiver   ROS      Objective:  Physical Exam  Neurovascular status is intact with reasonable digital perfusion for her age with an incurvated lateral border right hallux that is red along the side irritated and painful when pressed     Assessment:  Paronychia infection right hallux lateral border which does persist     Plan:  H&P reviewed condition and at this point anesthetized 60 mg like Marcaine mixture using sterile instrumentation I remove the lateral border carefully cleaned out necrotic tissue and discussed the possibility for vascular disease.  I applied sterile dressing and hopefully this will solve the pain she is in and I did not see any other problems or procedure I could do to try to help her

## 2020-04-10 NOTE — Progress Notes (Unsigned)
Virtual Visit via Telephone Note   This visit type was conducted due to national recommendations for restrictions regarding the COVID-19 Pandemic (e.g. social distancing) in an effort to limit this patient's exposure and mitigate transmission in our community.  Due to her co-morbid illnesses, this patient is at least at moderate risk for complications without adequate follow up.  This format is felt to be most appropriate for this patient at this time.  The patient did not have access to video technology/had technical difficulties with video requiring transitioning to audio format only (telephone).  All issues noted in this document were discussed and addressed.  No physical exam could be performed with this format.  Please refer to the patient's chart for her  consent to telehealth for [facility].   Date:  04/15/2020   ID:  Gina, Terry Mar 30, 1921, MRN [id number]  Patient Location: Home Provider Location: Home  PCP:  Altheimer, Legrand Como, MD  Cardiologist:  Peter Martinique MD Electrophysiologist:  None   Evaluation Performed:  Follow-Up Visit  Chief Complaint:  Follow up angina  History of Present Illness:    Gina Terry is a 87 y.o. female with a history of LBBB and paroxysmal Afib. She had a normal nuclear stress test in 2011. Echo in past apparently OK. She has been managed with rate control and ASA.   She presented in March 2019 with symptoms of progressive angina. She described chest pain in the mid sternal area radiating into her back and down both arms. Symptoms are relieved with rest. Symptoms have progressed to the point where she gets pain walking from one room to the next or brushing her teeth. She states she was no longer doing her grocery shopping because of this. Denied dyspnea or diaphoresis.  Her last episode of Afib occurred in November 2016 and converted spontaneously.  She still lives independently in home by herself. She has one daughter living in [place] and  a son living in [place]. Not much support system here although she states a neighbor will help her out if needed.   Due to her advanced age an initial conservative approach was pursued. She was started on Toprol XL 50 mg daily and given sl Ntg to use prn. Echo showed normal LV function and mild AS.  We later had to reduce the Toprol to 25 mg daily due to bradycardia with HR in the 40s.   She was admitted in December 2020 with acute colitis that responded to antibiotics. She also had a compression fracture in her back in early December. She has some resultant foot drop. She still has 24 hour care at home. She can only walk with a walker. She states she rarely gets pressure in her chest when lying down. Rate brief palpitations. Has minor issues of a stye and ingrown toenail. Overall feels she is doing well.   The patient does not have symptoms concerning for COVID-19 infection (fever, chills, cough, or new shortness of breath).    Past Medical History:  Diagnosis Date  . Arthritis   . GERD (gastroesophageal reflux disease)   . Hypertension, essential 04/19/2016  . Hypothyroid   . Left bundle branch block   . Osteoporosis   . Paroxysmal atrial fibrillation [facility])    Past Surgical History:  Procedure Laterality Date  . APPENDECTOMY    . CARDIOVASCULAR STRESS TEST  03-24-2009   EF 78%  . CHOLECYSTECTOMY    . TONSILLECTOMY AND ADENOIDECTOMY    . US ECHOCARDIOGRAPHY  05-07-2008   Est EF 50-55%     Current Meds  Medication Sig  . acetaminophen (TYLENOL) 500 MG tablet Take 500 mg by mouth every 6 (six) hours as needed for mild pain.  Marland Kitchen amLODipine (NORVASC) 2.5 MG tablet Take 1 tablet (2.5 mg total) by mouth daily.  Marland Kitchen aspirin 81 MG tablet Take 81 mg by mouth daily.  Marland Kitchen levothyroxine (SYNTHROID, LEVOTHROID) 50 MCG tablet Take 50 mcg by mouth daily before breakfast.  . metoprolol succinate (TOPROL-XL) 25 MG 24 hr tablet TAKE 1 TABLET BY MOUTH EVERY DAY  . zolpidem (AMBIEN) 5 MG tablet Take 2.5 mg  by mouth at bedtime.     Allergies:   Rofecoxib, Sulfamethoxazole-trimethoprim, Codeine, Estradiol, Flurbiprofen, and Ibuprofen   Social History   Tobacco Use  . Smoking status: Former Smoker    Packs/day: 0.00    Types: Cigarettes    Quit date: 01/31/1984    Years since quitting: 36.2  . Smokeless tobacco: Never Used  Vaping Use  . Vaping Use: Never used  Substance Use Topics  . Alcohol use: Yes    Comment: 3 oz of wine before dinner every night  . Drug use: No     Family Hx: The patient's family history includes Cancer in her sister; Heart attack in her father; Hip fracture in her mother; Stroke in her brother.  ROS:   Please see the history of present illness.    All other systems reviewed and are negative.   Prior CV studies:   The following studies were reviewed today:  Echo 04/18/17: Study Conclusions  - Left ventricle: The cavity size was normal. Wall thickness was   increased in a pattern of mild LVH. Septal bounce suggestive of   LBBB, otherwise normal wall motion. Systolic function was normal.   The estimated ejection fraction was in the range of 55% to 60%.   Doppler parameters are consistent with abnormal left ventricular   relaxation (grade 1 diastolic dysfunction). - Aortic valve: Trileaflet; severely calcified leaflets. There was   mild stenosis. Mean gradient (S): 12 mm Hg. Peak gradient (S): 19   mm Hg. Valve area (VTI): 1.86 cm^2. - Mitral valve: Mildly calcified annulus. There was no significant   regurgitation. - Left atrium: The atrium was mildly dilated. - Right ventricle: The cavity size was normal. Systolic function   was normal. - Tricuspid valve: Peak RV-RA gradient (S): 27 mm Hg. - Pulmonary arteries: PA peak pressure: 30 mm Hg (S). - Inferior vena cava: The vessel was normal in size. The   respirophasic diameter changes were in the normal range (= 50%),   consistent with normal central venous pressure. - Pericardium, extracardiac: A  trivial pericardial effusion was   identified.  Labs/Other Tests and Data Reviewed:    EKG:  No ECG reviewed.  Recent Labs: No results found for requested labs within last 8760 hours.   Recent Lipid Panel Lab Results  Component Value Date/Time   CHOL 165 04/06/2017 02:20 PM   TRIG 69 04/06/2017 02:20 PM   HDL 76 04/06/2017 02:20 PM   CHOLHDL 2.2 05/06/2008 04:25 AM   LDLCALC 75 04/06/2017 02:20 PM    Wt Readings from Last 3 Encounters:  04/15/20 75 lb (34 kg)  01/27/19 83 lb 12.4 oz (38 kg)  02/14/18 95 lb 6.4 oz (43.3 kg)     Objective:    Vital Signs:  Ht 4\' 11"  (1.499 m)   Wt 75 lb (34 kg)   BMI 15.15  kg/m    VITAL SIGNS:  reviewed  ASSESSMENT & PLAN:    1.  Angina pectoris- symptoms are well controlled on beta blocker and amlodipine. No change  2. Mild Aortic stenosis.  3. Paroxysmal Afib. Now on Toprol XL for rate control.  No symptomatic recurrence since 2016.  4. LBBB. More recent Ecg shows LVH with QRS widening.    COVID-19 Education: The signs and symptoms of COVID-19 were discussed with the patient and how to seek care for testing (follow up with PCP or arrange E-visit).  The importance of social distancing was discussed today.  Time:   Today, I have spent 10 minutes with the patient with telehealth technology discussing the above problems.     Medication Adjustments/Labs and Tests Ordered: Current medicines are reviewed at length with the patient today.  Concerns regarding medicines are outlined above.   Tests Ordered: No orders of the defined types were placed in this encounter.   Medication Changes: No orders of the defined types were placed in this encounter.   Follow Up:  In Person in 1 year(s)  Signed, Peter Martinique, MD  04/15/2020 9:40 AM    [place]

## 2020-04-15 ENCOUNTER — Telehealth: Payer: Self-pay | Admitting: Licensed Clinical Social Worker

## 2020-04-15 ENCOUNTER — Telehealth: Payer: Self-pay

## 2020-04-15 ENCOUNTER — Encounter: Payer: Self-pay | Admitting: Cardiology

## 2020-04-15 ENCOUNTER — Telehealth (INDEPENDENT_AMBULATORY_CARE_PROVIDER_SITE_OTHER): Payer: Medicare Other | Admitting: Cardiology

## 2020-04-15 VITALS — Ht 59.0 in | Wt 75.0 lb

## 2020-04-15 DIAGNOSIS — I350 Nonrheumatic aortic (valve) stenosis: Secondary | ICD-10-CM | POA: Diagnosis not present

## 2020-04-15 DIAGNOSIS — I209 Angina pectoris, unspecified: Secondary | ICD-10-CM | POA: Diagnosis not present

## 2020-04-15 DIAGNOSIS — I480 Paroxysmal atrial fibrillation: Secondary | ICD-10-CM | POA: Diagnosis not present

## 2020-04-15 NOTE — Patient Instructions (Signed)
Medication Instructions:  Continue same medications *If you need a refill on your cardiac medications before your next appointment, please call your pharmacy*   Lab Work: None ordered   Testing/Procedures: None ordered   Follow-Up: At [facility], you and your health needs are our priority.  As part of our continuing mission to provide you with exceptional heart care, we have created designated Provider Care Teams.  These Care Teams include your primary Cardiologist (physician) and Advanced Practice Providers (APPs -  Physician Assistants and Nurse Practitioners) who all work together to provide you with the care you need, when you need it.  We recommend signing up for the patient portal called "MyChart".  Sign up information is provided on this After Visit Summary.  MyChart is used to connect with patients for Virtual Visits (Telemedicine).  Patients are able to view lab/test results, encounter notes, upcoming appointments, etc.  Non-urgent messages can be sent to your provider as well.   To learn more about what you can do with MyChart, go to [email or web address].    Your next appointment:  1 year    Call in Jan to schedule March appointment    The format for your next appointment:  Office   Provider:  Dr.Jordan

## 2020-04-15 NOTE — Telephone Encounter (Signed)
Received call back from pt daughter Opal Sidles, clarified my role and reason for call. Shared that pt had told us that she wasn't checking her blood pressure. LCSW shared that team would like for pt to take her blood pressure and keep track of the readings. Also shared that pt recommended for f/u in one year. Pt daughter confirmed that pt does have a cuff but caregivers who assist pt 24/7 are limited in some of the things that they can assist with. Pt daughter will get cuff out and assist pt with taking readings regularly prior to next years visit. I let pt daughter know that I am available for any additional questions/concerns.   Westley Hummer, MSW, [place]  [phone]

## 2020-04-15 NOTE — Telephone Encounter (Signed)
  Patient Consent for Virtual Visit         Gina Terry has provided verbal consent on 04/15/2020 for a virtual visit (video or telephone).   CONSENT FOR VIRTUAL VISIT FOR:  Gina Terry  By participating in this virtual visit I agree to the following:  I hereby voluntarily request, consent and authorize [facility] and its employed or contracted physicians, physician assistants, nurse practitioners or other licensed health care professionals (the Practitioner), to provide me with telemedicine health care services (the "Services") as deemed necessary by the treating Practitioner. I acknowledge and consent to receive the Services by the Practitioner via telemedicine. I understand that the telemedicine visit will involve communicating with the Practitioner through live audiovisual communication technology and the disclosure of certain medical information by electronic transmission. I acknowledge that I have been given the opportunity to request an in-person assessment or other available alternative prior to the telemedicine visit and am voluntarily participating in the telemedicine visit.  I understand that I have the right to withhold or withdraw my consent to the use of telemedicine in the course of my care at any time, without affecting my right to future care or treatment, and that the Practitioner or I may terminate the telemedicine visit at any time. I understand that I have the right to inspect all information obtained and/or recorded in the course of the telemedicine visit and may receive copies of available information for a reasonable fee.  I understand that some of the potential risks of receiving the Services via telemedicine include:  Marland Kitchen Delay or interruption in medical evaluation due to technological equipment failure or disruption; . Information transmitted may not be sufficient (e.g. poor resolution of images) to allow for appropriate medical decision making by the Practitioner;  and/or  . In rare instances, security protocols could fail, causing a breach of personal health information.  Furthermore, I acknowledge that it is my responsibility to provide information about my medical history, conditions and care that is complete and accurate to the best of my ability. I acknowledge that Practitioner's advice, recommendations, and/or decision may be based on factors not within their control, such as incomplete or inaccurate data provided by me or distortions of diagnostic images or specimens that may result from electronic transmissions. I understand that the practice of medicine is not an exact science and that Practitioner makes no warranties or guarantees regarding treatment outcomes. I acknowledge that a copy of this consent can be made available to me via my patient portal ([facility]), or I can request a printed copy by calling the office of [facility].    I understand that my insurance will be billed for this visit.   I have read or had this consent read to me. . I understand the contents of this consent, which adequately explains the benefits and risks of the Services being provided via telemedicine.  . I have been provided ample opportunity to ask questions regarding this consent and the Services and have had my questions answered to my satisfaction. . I give my informed consent for the services to be provided through the use of telemedicine in my medical care

## 2020-04-15 NOTE — Telephone Encounter (Signed)
LCSW received referral from [place] regarding pt need for small BP cuff.  LCSW has f/u with pt daughter Opal Sidles (DPR on file) at [phone]. No answer, HIPAA compliant message left.   Westley Hummer, MSW, [place]  [phone]

## 2020-04-19 ENCOUNTER — Telehealth: Payer: Self-pay | Admitting: Licensed Clinical Social Worker

## 2020-04-19 NOTE — Telephone Encounter (Signed)
Received a call from pt daughter Opal Sidles, she confirms that pt has a cuff- and although it is still larger they were able to get comparable BP off of both arms and have encouraged pt and pt caregivers to regularly take and document her BP prior to next years appointment.   Westley Hummer, MSW, [place]  [phone]

## 2020-05-17 ENCOUNTER — Other Ambulatory Visit: Payer: Self-pay | Admitting: Cardiology

## 2020-05-17 NOTE — Telephone Encounter (Signed)
Rx has been sent to the pharmacy electronically. ° °

## 2020-08-21 ENCOUNTER — Encounter (HOSPITAL_BASED_OUTPATIENT_CLINIC_OR_DEPARTMENT_OTHER): Payer: Self-pay | Admitting: Urology

## 2020-08-21 ENCOUNTER — Other Ambulatory Visit: Payer: Self-pay

## 2020-08-21 ENCOUNTER — Emergency Department (HOSPITAL_BASED_OUTPATIENT_CLINIC_OR_DEPARTMENT_OTHER): Payer: Medicare Other

## 2020-08-21 ENCOUNTER — Emergency Department (HOSPITAL_BASED_OUTPATIENT_CLINIC_OR_DEPARTMENT_OTHER)
Admission: EM | Admit: 2020-08-21 | Discharge: 2020-08-21 | Disposition: A | Discharge status: Home / Self Care | Payer: Medicare Other | Attending: Emergency Medicine | Admitting: Emergency Medicine

## 2020-08-21 ENCOUNTER — Telehealth: Payer: Self-pay | Admitting: Adult Health

## 2020-08-21 DIAGNOSIS — D72829 Elevated white blood cell count, unspecified: Secondary | ICD-10-CM | POA: Diagnosis not present

## 2020-08-21 DIAGNOSIS — K529 Noninfective gastroenteritis and colitis, unspecified: Secondary | ICD-10-CM | POA: Insufficient documentation

## 2020-08-21 DIAGNOSIS — Z79899 Other long term (current) drug therapy: Secondary | ICD-10-CM | POA: Insufficient documentation

## 2020-08-21 DIAGNOSIS — Z7982 Long term (current) use of aspirin: Secondary | ICD-10-CM | POA: Insufficient documentation

## 2020-08-21 DIAGNOSIS — K219 Gastro-esophageal reflux disease without esophagitis: Secondary | ICD-10-CM | POA: Insufficient documentation

## 2020-08-21 DIAGNOSIS — E039 Hypothyroidism, unspecified: Secondary | ICD-10-CM | POA: Insufficient documentation

## 2020-08-21 DIAGNOSIS — I10 Essential (primary) hypertension: Secondary | ICD-10-CM | POA: Insufficient documentation

## 2020-08-21 DIAGNOSIS — Z87891 Personal history of nicotine dependence: Secondary | ICD-10-CM | POA: Insufficient documentation

## 2020-08-21 DIAGNOSIS — R1030 Lower abdominal pain, unspecified: Secondary | ICD-10-CM | POA: Diagnosis present

## 2020-08-21 LAB — CBC WITH DIFFERENTIAL/PLATELET
Abs Immature Granulocytes: 0.34 10*3/uL — ABNORMAL HIGH (ref 0.00–0.07)
Basophils Absolute: 0.1 10*3/uL (ref 0.0–0.1)
Basophils Relative: 0 %
Eosinophils Absolute: 0.1 10*3/uL (ref 0.0–0.5)
Eosinophils Relative: 0 %
HCT: 43.7 % (ref 36.0–46.0)
Hemoglobin: 14.6 g/dL (ref 12.0–15.0)
Immature Granulocytes: 1 %
Lymphocytes Relative: 7 %
Lymphs Abs: 1.7 10*3/uL (ref 0.7–4.0)
MCH: 30.2 pg (ref 26.0–34.0)
MCHC: 33.4 g/dL (ref 30.0–36.0)
MCV: 90.5 fL (ref 80.0–100.0)
Monocytes Absolute: 5.2 10*3/uL — ABNORMAL HIGH (ref 0.1–1.0)
Monocytes Relative: 22 %
Neutro Abs: 16.2 10*3/uL — ABNORMAL HIGH (ref 1.7–7.7)
Neutrophils Relative %: 70 %
Platelets: 216 10*3/uL (ref 150–400)
RBC: 4.83 MIL/uL (ref 3.87–5.11)
RDW: 13.8 % (ref 11.5–15.5)
WBC: 23.7 10*3/uL — ABNORMAL HIGH (ref 4.0–10.5)
nRBC: 0 % (ref 0.0–0.2)

## 2020-08-21 LAB — COMPREHENSIVE METABOLIC PANEL
ALT: 10 U/L (ref 0–44)
AST: 22 U/L (ref 15–41)
Albumin: 4 g/dL (ref 3.5–5.0)
Alkaline Phosphatase: 45 U/L (ref 38–126)
Anion gap: 15 (ref 5–15)
BUN: 20 mg/dL (ref 8–23)
CO2: 20 mmol/L — ABNORMAL LOW (ref 22–32)
Calcium: 9.4 mg/dL (ref 8.9–10.3)
Chloride: 99 mmol/L (ref 98–111)
Creatinine, Ser: 1.12 mg/dL — ABNORMAL HIGH (ref 0.44–1.00)
GFR, Estimated: 45 mL/min — ABNORMAL LOW (ref >60)
Glucose, Bld: 100 mg/dL — ABNORMAL HIGH (ref 70–99)
Potassium: 4.3 mmol/L (ref 3.5–5.1)
Sodium: 134 mmol/L — ABNORMAL LOW (ref 135–145)
Total Bilirubin: 0.5 mg/dL (ref 0.3–1.2)
Total Protein: 6.5 g/dL (ref 6.5–8.1)

## 2020-08-21 LAB — LIPASE, BLOOD: Lipase: 42 U/L (ref 11–51)

## 2020-08-21 MED ORDER — SODIUM CHLORIDE 0.9 % IV BOLUS
1000.0000 mL | Freq: Once | INTRAVENOUS | Status: AC
  Administered 2020-08-21: 1000 mL via INTRAVENOUS

## 2020-08-21 MED ORDER — METRONIDAZOLE 500 MG PO TABS: 500.0000 mg | ORAL_TABLET | Freq: Two times a day (BID) | ORAL | 0 refills | Status: AC

## 2020-08-21 MED ORDER — CEFUROXIME AXETIL 250 MG PO TABS: 250.0000 mg | ORAL_TABLET | Freq: Two times a day (BID) | ORAL | 0 refills | Status: DC

## 2020-08-21 MED ORDER — IOHEXOL 350 MG/ML SOLN
100.0000 mL | Freq: Once | INTRAVENOUS | Status: AC | PRN
  Administered 2020-08-21: 70 mL via INTRAVENOUS

## 2020-08-21 MED ORDER — METRONIDAZOLE 500 MG PO TABS: 500.0000 mg | ORAL_TABLET | Freq: Two times a day (BID) | ORAL | 0 refills | Status: DC

## 2020-08-21 NOTE — Telephone Encounter (Signed)
Pt called the oncall service to report abd pain and diarrhea for several days. She reports she has had inflammatory bowel disease flares before and wants an antibiotics. I let her know that given her age and the risk involved she would need to go to the ER for further evaluation. She has not established care with our practice yet but will see Dr. Sabra Heck soon.

## 2020-08-21 NOTE — ED Provider Notes (Signed)
[facility] EMERGENCY DEPT Provider Note   CSN: [id number] Arrival date & time: 08/21/20  1346     History Chief Complaint  Patient presents with   Diarrhea   Abdominal Pain    Gina Terry is a 87 y.o. female.  HPI     87 year old female with history of paroxysmal atrial fibrillation, pulm infarct, hypertension, hypothyroidism, chronic low back pain, adult failure to thrive, stage III CKD, irritable bowel syndrome with constipation alternating with loose stools, who presents with concern for abdominal pain and diarrhea.  Diarrhea 4 times today, had it the day before yesterday but improved yesterday No black or bloody stools Lower abdominal pain, off and on medium in severity, cramping pain, also feels it in the middle No nausea, no vomiting, no fevers but when having diarrhea will break out in cold sweat and feel weak Did take antidiarrheal medication, loperamide this AM Hx of colitis in the past  One of caretakers out day before yesterday with stomach problems No recent abx   Past Medical History:  Diagnosis Date   Arthritis    GERD (gastroesophageal reflux disease)    History of bone density study    History of mammogram    Hypertension, essential 04/19/2016   Hypothyroid    Left bundle branch block    Osteoporosis    Paroxysmal atrial fibrillation [facility])     Patient Active Problem List   Diagnosis Date Noted   Gastroenteritis 02/04/2019   Colitis 01/25/2019   Weight loss, unintentional 09/23/2018   Chronic bilateral low back pain 04/19/2016   Gastroesophageal reflux 04/19/2016   Hypertension, essential 04/19/2016   Osteoporosis, 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/19/2016   Primary hypothyroidism 04/19/2016   Paroxysmal atrial fibrillation ([place])    Left bundle branch block    OTHER CONSTIPATION 07/22/2007   ABDOMINAL PAIN, EPIGASTRIC 07/22/2007   GASTRITIS, CHRONIC 05/14/2007   DIVERTICULOSIS, COLON 05/14/2007    Past Surgical History:  Procedure  Laterality Date   APPENDECTOMY     CARDIOVASCULAR STRESS TEST  03/24/2009   EF 78%   CHOLECYSTECTOMY     COLONOSCOPY     TONSILLECTOMY AND ADENOIDECTOMY     US ECHOCARDIOGRAPHY  05/07/2008   Est EF 50-55%     OB History   No obstetric history on file.     Family History  Problem Relation Age of Onset   Hip fracture Mother    Heart attack Father    Cancer Sister    Stroke Brother     Social History   Tobacco Use   Smoking status: Former    Years: 31.00    Types: Cigarettes    Quit date: 01/31/1984    Years since quitting: 36.5   Smokeless tobacco: Never  Vaping Use   Vaping Use: Never used  Substance Use Topics   Alcohol use: Yes    Comment: 3 oz of wine before dinner every night   Drug use: Never    Home Medications Prior to Admission medications   Medication Sig Start Date End Date Taking? Authorizing Provider  acetaminophen (TYLENOL) 500 MG tablet Take 500 mg by mouth every 6 (six) hours as needed for mild pain.    [provider]  amLODipine (NORVASC) 2.5 MG tablet TAKE 1 TABLET BY MOUTH EVERY DAY 05/17/20   Martinique, Peter M, MD  aspirin 81 MG tablet Take 81 mg by mouth daily.    [provider]  cefUROXime (CEFTIN) 250 MG tablet Take 1 tablet (250 mg total) by mouth  2 (two) times daily with a meal for 7 days. 08/21/20 08/28/20  Gareth Morgan, MD  levothyroxine (SYNTHROID, LEVOTHROID) 50 MCG tablet Take 50 mcg by mouth daily before breakfast.    [provider]  metoprolol succinate (TOPROL-XL) 25 MG 24 hr tablet TAKE 1 TABLET BY MOUTH EVERY DAY 01/19/20   Martinique, Peter M, MD  metroNIDAZOLE (FLAGYL) 500 MG tablet Take 1 tablet (500 mg total) by mouth 2 (two) times daily for 7 days. 08/21/20 08/28/20  Gareth Morgan, MD  nitroGLYCERIN (NITROSTAT) 0.4 MG SL tablet Place 1 tablet (0.4 mg total) under the tongue every 5 (five) minutes as needed for chest pain. 02/19/19 05/20/19  Martinique, Peter M, MD  zolpidem (AMBIEN) 5 MG tablet Take 2.5 mg by  mouth at bedtime.    [provider]    Allergies    Rofecoxib, Sulfamethoxazole-trimethoprim, Codeine, Estradiol, Flurbiprofen, and Ibuprofen  Review of Systems   Review of Systems  Constitutional:  Negative for fever.  Respiratory:  Negative for shortness of breath (chronic dyspnea on exertion).   Cardiovascular:  Negative for chest pain.  Gastrointestinal:  Positive for abdominal pain and diarrhea. Negative for nausea and vomiting.  Genitourinary:  Negative for dysuria.  Skin:  Negative for rash.  Neurological:  Negative for light-headedness (rarely).   Physical Exam Updated Vital Signs BP (!) 159/78   Pulse 67   Temp 97.6 F (36.4 C) (Oral)   Resp 16   Ht '4\' 11"'$  (1.499 m)   Wt 34 kg   SpO2 95%   BMI 15.15 kg/m   Physical Exam Vitals and nursing note reviewed.  Constitutional:      General: She is not in acute distress.    Appearance: She is well-developed. She is not diaphoretic.  HENT:     Head: Normocephalic and atraumatic.  Eyes:     Conjunctiva/sclera: Conjunctivae normal.  Cardiovascular:     Rate and Rhythm: Normal rate and regular rhythm.     Heart sounds: Normal heart sounds. No murmur heard.   No friction rub. No gallop.  Pulmonary:     Effort: Pulmonary effort is normal. No respiratory distress.     Breath sounds: Normal breath sounds. No wheezing or rales.  Abdominal:     General: There is no distension.     Palpations: Abdomen is soft.     Tenderness: There is no abdominal tenderness. There is no guarding.  Musculoskeletal:        General: No tenderness.     Cervical back: Normal range of motion.  Skin:    General: Skin is warm and dry.     Findings: No erythema or rash.  Neurological:     Mental Status: She is alert and oriented to person, place, and time.    ED Results / Procedures / Treatments   Labs (all labs ordered are listed, but only abnormal results are displayed) Labs Reviewed  CBC WITH DIFFERENTIAL/PLATELET - Abnormal;  Notable for the following components:      Result Value   WBC 23.7 (*)    Neutro Abs 16.2 (*)    Monocytes Absolute 5.2 (*)    Abs Immature Granulocytes 0.34 (*)    All other components within normal limits  COMPREHENSIVE METABOLIC PANEL - Abnormal; Notable for the following components:   Sodium 134 (*)    CO2 20 (*)    Glucose, Bld 100 (*)    Creatinine, Ser 1.12 (*)    GFR, Estimated 45 (*)  All other components within normal limits  LIPASE, BLOOD  PATHOLOGIST SMEAR REVIEW    EKG None  Radiology CT ABDOMEN PELVIS W CONTRAST  Result Date: 08/21/2020 CLINICAL DATA:  Lower abdominal pain. EXAM: CT ABDOMEN AND PELVIS WITH CONTRAST TECHNIQUE: Multidetector CT imaging of the abdomen and pelvis was performed using the standard protocol following bolus administration of intravenous contrast. CONTRAST:  34m OMNIPAQUE IOHEXOL 350 MG/ML SOLN COMPARISON:  January 25, 2019. FINDINGS: Lower chest: No acute abnormality. Hepatobiliary: No focal liver abnormality is seen. Status post cholecystectomy. No biliary dilatation. Pancreas: Unremarkable. No pancreatic ductal dilatation or surrounding inflammatory changes. Spleen: Normal in size without focal abnormality. Adrenals/Urinary Tract: Adrenal glands are unremarkable. Kidneys are normal, without renal calculi, focal lesion, or hydronephrosis. Bladder is unremarkable. Stomach/Bowel: The stomach appears normal. Status post appendectomy. There is no evidence of bowel obstruction. Mild wall thickening is seen involving the distal descending and proximal sigmoid colon concerning for infectious or inflammatory colitis. Vascular/Lymphatic: Aortic atherosclerosis. No enlarged abdominal or pelvic lymph nodes. Reproductive: Uterus and bilateral adnexa are unremarkable. Other: No abdominal wall hernia or abnormality. No abdominopelvic ascites. Musculoskeletal: No acute or significant osseous findings. IMPRESSION: Probable wall thickening is seen involving the  distal descending and proximal sigmoid colon concerning for infectious or inflammatory colitis. Aortic Atherosclerosis (ICD10-I70.0). Electronically Signed   By: JMarijo ConceptionM.D.   On: 08/21/2020 16:42    Procedures Procedures   Medications Ordered in ED Medications  sodium chloride 0.9 % bolus 1,000 mL (0 mLs Intravenous Stopped 08/21/20 1859)  iohexol (OMNIPAQUE) 350 MG/ML injection 100 mL (70 mLs Intravenous Contrast Given 08/21/20 1617)    ED Course  I have reviewed the triage vital signs and the nursing notes.  Pertinent labs & imaging results that were available during my care of the patient were reviewed by me and considered in my medical decision making (see chart for details).    MDM Rules/Calculators/A&P                            87year old female with history of paroxysmal atrial fibrillation, pulm infarct, hypertension, hypothyroidism, chronic low back pain, adult failure to thrive, stage III CKD, irritable bowel syndrome with constipation alternating with loose stools, who presents with concern for abdominal pain and diarrhea.  Cr slightly increased from previous.  WBC elevated to 23000, similar/a little less than prior colitis admission.   CT shows probable wall thickening involving distal descending and proximal sigmoid colon concerning for infectious or inflammatory colitis. Lower suspicion for ischemic etiology with prior episode and associated leukocytosis that improved with abx.  Has not had diarrhea in the ED to send stool sample-has not had recent abx, given prior colitis also improved with abx have low suspicion this represents cdiff.  Given IV fluids for hydration. Discussed possible admission with patient given history of same, leukocytosis and age,  but she is feeling well, tolerating po, is hemodynamically stable without significant electrolyte abnormalities or AKI to require admission and prefers outpatient management with strict return precautions.  Given rx  for antibiotics. Patient discharged in stable condition with understanding of reasons to return.   Final Clinical Impression(s) / ED Diagnoses Final diagnoses:  Colitis  Leukocytosis, unspecified type    Rx / DC Orders ED Discharge Orders          Ordered    cefUROXime (CEFTIN) 250 MG tablet  2 times daily with meals,   Status:  Discontinued  08/21/20 1902    metroNIDAZOLE (FLAGYL) 500 MG tablet  2 times daily,   Status:  Discontinued        08/21/20 1902    cefUROXime (CEFTIN) 250 MG tablet  2 times daily with meals        08/21/20 1947    metroNIDAZOLE (FLAGYL) 500 MG tablet  2 times daily        08/21/20 1947             Gareth Morgan, MD 08/22/20 [phone]

## 2020-08-21 NOTE — ED Triage Notes (Signed)
Diarrhea x 3 days, lower abdominal pain, denies fever. Denies n/v.

## 2020-08-21 NOTE — ED Notes (Signed)
Pt verbalizes understanding of discharge instructions. Opportunity for questioning and answers were provided. Armand removed by staff, pt discharged from ED to home with caregiver. Pt educated to pick up Rx

## 2020-08-24 ENCOUNTER — Encounter: Payer: Self-pay | Admitting: Family Medicine

## 2020-08-24 ENCOUNTER — Ambulatory Visit (INDEPENDENT_AMBULATORY_CARE_PROVIDER_SITE_OTHER): Payer: Medicare Other | Admitting: Family Medicine

## 2020-08-24 ENCOUNTER — Other Ambulatory Visit: Payer: Self-pay

## 2020-08-24 VITALS — BP 110/70 | HR 59 | Temp 97.8°F | Ht 59.0 in | Wt 76.6 lb

## 2020-08-24 DIAGNOSIS — M545 Low back pain, unspecified: Secondary | ICD-10-CM | POA: Diagnosis not present

## 2020-08-24 DIAGNOSIS — I480 Paroxysmal atrial fibrillation: Secondary | ICD-10-CM

## 2020-08-24 DIAGNOSIS — E039 Hypothyroidism, unspecified: Secondary | ICD-10-CM

## 2020-08-24 DIAGNOSIS — K529 Noninfective gastroenteritis and colitis, unspecified: Secondary | ICD-10-CM | POA: Diagnosis not present

## 2020-08-24 DIAGNOSIS — I209 Angina pectoris, unspecified: Secondary | ICD-10-CM | POA: Diagnosis not present

## 2020-08-24 DIAGNOSIS — G8929 Other chronic pain: Secondary | ICD-10-CM

## 2020-08-24 DIAGNOSIS — M810 Age-related osteoporosis without current pathological fracture: Secondary | ICD-10-CM

## 2020-08-24 LAB — PATHOLOGIST SMEAR REVIEW

## 2020-08-24 NOTE — Progress Notes (Signed)
Provider:  Alain Honey, MD  Careteam: Patient Care Team: Wardell Honour, MD as PCP - General (Family Medicine) Martinique, Peter M, MD as Consulting Physician (Cardiology)  PLACE OF SERVICE:  [facility] Directive information    Allergies  Allergen Reactions   Rofecoxib     Other reaction(s): Other (See Comments) reflux, edema    Sulfamethoxazole-Trimethoprim Nausea Only   Codeine Other (See Comments)    unknown Other reaction(s): GI Upset (intolerance)   Estradiol Rash    Local rash   Flurbiprofen     Other reaction(s): GI Upset (intolerance)   Ibuprofen     Other reaction(s): GI Upset (intolerance)    Chief Complaint  Patient presents with   New Patient (Initial Visit)    Patient presents today for a new patient appointment. She was diagnosis and history of  HTN, GERD and hypothyroid.     HPI: Patient is a 87 y.o. female.  She is a former patient of Dr. Eden Emms who retired.  Generally, she seems very healthy.  She does have a history of paroxysmal atrial fib and takes metoprolol for rate control.  She also has a history of essential hypertension.  She has lost weight since she had a back fracture. She continues with Ceftin and metronidazole for the colitis that was treated in the emergency room on 723. She lives alone but has caregivers 24 hours a day.  Review of Systems:  Review of Systems  Constitutional:  Positive for weight loss.  Respiratory: Negative.    Cardiovascular:  Positive for palpitations.  Musculoskeletal:  Positive for back pain.  All other systems reviewed and are negative.  Past Medical History:  Diagnosis Date   Arthritis    GERD (gastroesophageal reflux disease)    History of bone density study    History of mammogram    Hypertension, essential 04/19/2016   Hypothyroid    Left bundle branch block    Osteoporosis    Paroxysmal atrial fibrillation [facility])    Past Surgical History:  Procedure Laterality Date    APPENDECTOMY     CARDIOVASCULAR STRESS TEST  03/24/2009   EF 78%   CHOLECYSTECTOMY     COLONOSCOPY     TONSILLECTOMY AND ADENOIDECTOMY     US ECHOCARDIOGRAPHY  05/07/2008   Est EF 50-55%   Social History:   reports that she quit smoking about 36 years ago. Her smoking use included cigarettes. She has never used smokeless tobacco. She reports current alcohol use. She reports that she does not use drugs.  Family History  Problem Relation Age of Onset   Hip fracture Mother    Heart attack Father    Breast cancer Sister    Stroke Brother     Medications: Patient's Medications  New Prescriptions   No medications on file  Previous Medications   ACETAMINOPHEN (TYLENOL) 500 MG TABLET    Take 500 mg by mouth every 6 (six) hours as needed for mild pain.   AMLODIPINE (NORVASC) 2.5 MG TABLET    TAKE 1 TABLET BY MOUTH EVERY DAY   ASPIRIN 81 MG TABLET    Take 81 mg by mouth daily.   CEFUROXIME (CEFTIN) 250 MG TABLET    Take 1 tablet (250 mg total) by mouth 2 (two) times daily with a meal for 7 days.   LEVOTHYROXINE (SYNTHROID, LEVOTHROID) 50 MCG TABLET    Take 50 mcg by mouth daily before breakfast.   METOPROLOL SUCCINATE (TOPROL-XL) 25 MG 24 HR  TABLET    TAKE 1 TABLET BY MOUTH EVERY DAY   METRONIDAZOLE (FLAGYL) 500 MG TABLET    Take 1 tablet (500 mg total) by mouth 2 (two) times daily for 7 days.   NITROGLYCERIN (NITROSTAT) 0.4 MG SL TABLET    Place 1 tablet (0.4 mg total) under the tongue every 5 (five) minutes as needed for chest pain.   ZOLPIDEM (AMBIEN) 5 MG TABLET    Take 2.5 mg by mouth at bedtime.  Modified Medications   No medications on file  Discontinued Medications   No medications on file    Physical Exam:  Vitals:   08/24/20 1039  BP: 110/70  Pulse: (!) 59  Temp: 97.8 F (36.6 C)  TempSrc: Temporal  SpO2: 96%  Weight: 76 lb 9.6 oz (34.7 kg)  Height: '4\' 11"'$  (1.499 m)   Body mass index is 15.47 kg/m. Wt Readings from Last 3 Encounters:  08/24/20 76 lb 9.6 oz  (34.7 kg)  08/21/20 75 lb (34 kg)  04/15/20 75 lb (34 kg)    Physical Exam Vitals and nursing note reviewed.  Constitutional:      Appearance: Normal appearance.  HENT:     Head: Normocephalic.  Cardiovascular:     Rate and Rhythm: Normal rate and regular rhythm.     Pulses: Normal pulses.     Heart sounds: Normal heart sounds.  Pulmonary:     Effort: Pulmonary effort is normal.     Breath sounds: Normal breath sounds.  Abdominal:     General: Abdomen is flat. Bowel sounds are normal.  Musculoskeletal:        General: Normal range of motion.     Cervical back: Normal range of motion.  Neurological:     General: No focal deficit present.     Mental Status: She is alert and oriented to person, place, and time.  Psychiatric:        Mood and Affect: Mood normal.        Behavior: Behavior normal.        Thought Content: Thought content normal.        Judgment: Judgment normal.    Labs reviewed: Basic Metabolic Panel: Recent Labs    08/21/20 1451  NA 134*  K 4.3  CL 99  CO2 20*  GLUCOSE 100*  BUN 20  CREATININE 1.12*  CALCIUM 9.4   Liver Function Tests: Recent Labs    08/21/20 1451  AST 22  ALT 10  ALKPHOS 45  BILITOT 0.5  PROT 6.5  ALBUMIN 4.0   Recent Labs    08/21/20 1451  LIPASE 42   No results for input(s): AMMONIA in the last 8760 hours. CBC: Recent Labs    08/21/20 1451  WBC 23.7*  NEUTROABS 16.2*  HGB 14.6  HCT 43.7  MCV 90.5  PLT 216   Lipid Panel: No results for input(s): CHOL, HDL, LDLCALC, TRIG, CHOLHDL, LDLDIRECT in the last 8760 hours. TSH: No results for input(s): TSH in the last 8760 hours. A1C: No results found for: HGBA1C   Assessment/Plan  1. Primary hypothyroidism She takes Synthroid 50 mcg.  TSH has not been assessed in the last 3+ years. - TSH; Future  2. Colitis She was seen in the emergency room 3 days ago for this problem.  Since taking antibiotics symptoms have resolved.  Would like to repeat her CBC to see  after the antibiotics since [place] count was very elevated - CBC with Differential/Platelet; Future  3. Paroxysmal atrial  fibrillation ([place]) Seems to be in sinus rhythm today.  Continue with metoprolol  4. Chronic bilateral low back pain without sciatica History of back fracture.  This limits her ability to ambulate.  She is in no therapy or on no medication.  I did suggest to continue the Tums and add vitamin D for bone health as well as strength.  5. Osteoporosis, postmenopausal Add Vitamin D to calcium   Alain Honey, MD [place] [phone]

## 2020-08-24 NOTE — Patient Instructions (Addendum)
You may break the metronidazole and have to make it easier to take. After you have finished the course of antibiotics, I would like you to return to check your blood count and thyroid I would also like you to take vitamin D, at least 400 units daily to help your body absorb the calcium you get from taking Tums

## 2020-08-25 ENCOUNTER — Telehealth: Payer: Self-pay | Admitting: *Deleted

## 2020-08-25 NOTE — Telephone Encounter (Signed)
Patient called and stated that she was seen yesterday and since then she has had diarrhea. Very Liquid. No appetite. No other symptoms noted.   Patient is wondering if this could be coming from one of her medications.   Please Advise.

## 2020-08-25 NOTE — Telephone Encounter (Signed)
Gina Honour, MD  You 29 minutes ago (4:26 PM)   She is taking ceftin for colitis; frequent side effect is diarrhea, so D/C ceftin but continue Metronidazole until Rx is completed       Patient notified and agreed.

## 2020-08-27 ENCOUNTER — Telehealth (INDEPENDENT_AMBULATORY_CARE_PROVIDER_SITE_OTHER): Payer: Medicare Other | Admitting: Family

## 2020-08-27 ENCOUNTER — Encounter: Payer: Self-pay | Admitting: Family

## 2020-08-27 ENCOUNTER — Other Ambulatory Visit: Payer: Self-pay

## 2020-08-27 DIAGNOSIS — K529 Noninfective gastroenteritis and colitis, unspecified: Secondary | ICD-10-CM

## 2020-08-27 MED ORDER — LOPERAMIDE HCL 2 MG PO CAPS: 2.0000 mg | ORAL_CAPSULE | ORAL | 0 refills | Status: DC | PRN

## 2020-08-27 NOTE — Patient Instructions (Addendum)
-   continue with hydration and Fluid intake   - Advance diet as tolerated to regular food   - Take imodium as directed

## 2020-08-27 NOTE — Progress Notes (Signed)
This service is provided via telemedicine  No vital signs collected/recorded due to the encounter was a telemedicine visit.   Location of patient (ex: home, work):  Home.   Patient consents to a telephone visit:  Yes  Location of the provider (ex: office, home):  [employer].   Name of any referring provider:  Wardell Honour, MD   Names of all persons participating in the telemedicine service and their role in the encounter: Patient, Heriberto [place], [place], [place], Webb Silversmith, NP.    Time spent on call: 8 minutes spent on the phone with Medical Assistant.     Provider: Tino Ronan FNP-C  Wardell Honour, MD  Patient Care Team: Wardell Honour, MD as PCP - General (Family Medicine) [place], Peter M, MD as Consulting Physician (Cardiology)  Extended Emergency Contact Information Primary Emergency Contact: [place] Phone: [phone] Relation: Daughter  Code Status:  DNR Goals of care: Advanced Directive information Advanced Directives 08/27/2020  Does Patient Have a Medical Advance Directive? No  Type of Advance Directive -  Does patient want to make changes to medical advance directive? -  Copy of [place] in Chart? -  Would patient like information on creating a medical advance directive? No - Patient declined     Chief Complaint  Patient presents with   Acute Visit    Patient complains of diarrhea for several days.    HPI:  Pt is a 87 y.o. female seen today for an acute visit for evaluation of diarrhea.she describe diarrhea as runny watery and loose. States Doctor at the ED prescribed two antibiotics but  read that one has side effects as diarrhea so Dr.Miller advised to stop.States still taking the one that begins with a " M ". has had Diarrhea twice today. Not eating much for the last few days.able to drink fluids. Has had no abdominal pain but just has discomfort.Has had a lot of  gas.feels bloated after eating.she denies any nausea,vomiting,fever or chills.  Has not used imodium but has store brand at home wants to know if she can use it.  On chart review, she was seen in ED 08/21/2020 Cefuroxime and Metronidazole was prescribed.    Past Medical History:  Diagnosis Date   Arthritis    GERD (gastroesophageal reflux disease)    History of bone density study    History of mammogram    Hypertension, essential 04/19/2016   Hypothyroid    Left bundle branch block    Osteoporosis    Paroxysmal atrial fibrillation [facility])    Past Surgical History:  Procedure Laterality Date   APPENDECTOMY     CARDIOVASCULAR STRESS TEST  03/24/2009   EF 78%   CHOLECYSTECTOMY     COLONOSCOPY     TONSILLECTOMY AND ADENOIDECTOMY     US ECHOCARDIOGRAPHY  05/07/2008   Est EF 50-55%    Allergies  Allergen Reactions   Rofecoxib     Other reaction(s): Other (See Comments) reflux, edema    Sulfamethoxazole-Trimethoprim Nausea Only   Codeine Other (See Comments)    unknown Other reaction(s): GI Upset (intolerance)   Estradiol Rash    Local rash   Flurbiprofen     Other reaction(s): GI Upset (intolerance)   Ibuprofen     Other reaction(s): GI Upset (intolerance)    Outpatient Encounter Medications as of 08/27/2020  Medication Sig   acetaminophen (TYLENOL) 500 MG tablet Take 500 mg by mouth every 6 (six) hours  as needed for mild pain.   amLODipine (NORVASC) 2.5 MG tablet TAKE 1 TABLET BY MOUTH EVERY DAY   aspirin 81 MG tablet Take 81 mg by mouth daily.   levothyroxine (SYNTHROID, LEVOTHROID) 50 MCG tablet Take 50 mcg by mouth daily before breakfast.   metoprolol succinate (TOPROL-XL) 25 MG 24 hr tablet TAKE 1 TABLET BY MOUTH EVERY DAY   metroNIDAZOLE (FLAGYL) 500 MG tablet Take 1 tablet (500 mg total) by mouth 2 (two) times daily for 7 days.   zolpidem (AMBIEN) 5 MG tablet Take 2.5 mg by mouth at bedtime.   nitroGLYCERIN (NITROSTAT) 0.4 MG SL tablet Place 1 tablet (0.4 mg total)  under the tongue every 5 (five) minutes as needed for chest pain.   No facility-administered encounter medications on file as of 08/27/2020.    Review of Systems  Constitutional:  Negative for appetite change, chills, fatigue, fever and unexpected weight change.  HENT:  Negative for congestion, dental problem, ear discharge, ear pain, facial swelling, hearing loss, nosebleeds, postnasal drip, rhinorrhea, sinus pressure, sinus pain, sneezing, sore throat, tinnitus and trouble swallowing.   Eyes:  Negative for pain, discharge, redness, itching and visual disturbance.  Respiratory:  Negative for cough, chest tightness, shortness of breath and wheezing.   Cardiovascular:  Negative for chest pain, palpitations and leg swelling.  Gastrointestinal:  Positive for diarrhea. Negative for abdominal distention, abdominal pain, blood in stool, constipation, nausea and vomiting.  Musculoskeletal:  Negative for arthralgias, back pain, gait problem, joint swelling, myalgias, neck pain and neck stiffness.  Skin:  Negative for color change, pallor, rash and wound.  Neurological:  Negative for dizziness, syncope, weakness, light-headedness and headaches.  Psychiatric/Behavioral:  Negative for agitation, behavioral problems and confusion. The patient is not nervous/anxious.    Immunization History  Administered Date(s) Administered   Influenza Split 11/05/2017   Influenza, High Dose Seasonal PF 10/31/2016, 11/12/2018, 01/05/2020   Influenza,inj,Quad PF,6+ Mos 11/05/2017   Influenza,inj,Quad PF,6-35 Mos 11/05/2017   PFIZER(Purple Top)SARS-COV-2 Vaccination 09/11/2019   Pertinent  Health Maintenance Due  Topic Date Due   DEXA SCAN  Never done   PNA vac Low Risk Adult (1 of 2 - PCV13) Never done   INFLUENZA VACCINE  08/30/2020   Fall Risk  08/27/2020 08/24/2020  Falls in the past year? 0 0  Number falls in past yr: 0 0  Injury with Fall? 0 0  Risk for fall due to : No Fall Risks History of fall(s)  Follow  up Falls evaluation completed Falls evaluation completed;Education provided;Falls prevention discussed   Functional Status Survey:    There were no vitals filed for this visit. There is no height or weight on file to calculate BMI. Physical Exam  Unable to complete on telephone visit   Labs reviewed: Recent Labs    08/21/20 1451  NA 134*  K 4.3  CL 99  CO2 20*  GLUCOSE 100*  BUN 20  CREATININE 1.12*  CALCIUM 9.4   Recent Labs    08/21/20 1451  AST 22  ALT 10  ALKPHOS 45  BILITOT 0.5  PROT 6.5  ALBUMIN 4.0   Recent Labs    08/21/20 1451  WBC 23.7*  NEUTROABS 16.2*  HGB 14.6  HCT 43.7  MCV 90.5  PLT 216   Lab Results  Component Value Date   TSH 3.060 04/06/2017   No results found for: HGBA1C Lab Results  Component Value Date   CHOL 165 04/06/2017   HDL 76 04/06/2017   [place]  75 04/06/2017   TRIG 69 04/06/2017   CHOLHDL 2.2 05/06/2008    Significant Diagnostic Results in last 30 days:  CT ABDOMEN PELVIS W CONTRAST  Result Date: 08/21/2020 CLINICAL DATA:  Lower abdominal pain. EXAM: CT ABDOMEN AND PELVIS WITH CONTRAST TECHNIQUE: Multidetector CT imaging of the abdomen and pelvis was performed using the standard protocol following bolus administration of intravenous contrast. CONTRAST:  9m OMNIPAQUE IOHEXOL 350 MG/ML SOLN COMPARISON:  January 25, 2019. FINDINGS: Lower chest: No acute abnormality. Hepatobiliary: No focal liver abnormality is seen. Status post cholecystectomy. No biliary dilatation. Pancreas: Unremarkable. No pancreatic ductal dilatation or surrounding inflammatory changes. Spleen: Normal in size without focal abnormality. Adrenals/Urinary Tract: Adrenal glands are unremarkable. Kidneys are normal, without renal calculi, focal lesion, or hydronephrosis. Bladder is unremarkable. Stomach/Bowel: The stomach appears normal. Status post appendectomy. There is no evidence of bowel obstruction. Mild wall thickening is seen involving the distal  descending and proximal sigmoid colon concerning for infectious or inflammatory colitis. Vascular/Lymphatic: Aortic atherosclerosis. No enlarged abdominal or pelvic lymph nodes. Reproductive: Uterus and bilateral adnexa are unremarkable. Other: No abdominal wall hernia or abnormality. No abdominopelvic ascites. Musculoskeletal: No acute or significant osseous findings. IMPRESSION: Probable wall thickening is seen involving the distal descending and proximal sigmoid colon concerning for infectious or inflammatory colitis. Aortic Atherosclerosis (ICD10-I70.0). Electronically Signed   By: JMarijo ConceptionM.D.   On: 08/21/2020 16:42    Assessment/Plan  Colitis Afebrile. Has had loose runny watery stool x 2 today.No abdominal pain. - advised to continue on Metronidazole - continue with Hydration  - Advance diet to solid as tolerated  - add imodium as needed as below  - loperamide (IMODIUM A-D) 2 MG capsule; Take 1 capsule (2 mg total) by mouth as needed for diarrhea or loose stools.  Dispense: 30 capsule; Refill: 0 - advised to Notify provider or go to ED if symptoms worsen or fail to improve   Family/ staff Communication: Reviewed plan of care with patient verbalized understanding   Labs/tests ordered: None   Next Appointment: As needed if symptoms worsen or fail to improve   I connected with  BPATTYANN AILSTOCKon 08/27/20 by a Telephone enabled telemedicine application and verified that I am speaking with the correct person using two identifiers.   I discussed the limitations of evaluation and management by telemedicine. The patient expressed understanding and agreed to proceed.  Spent 11 minutes of non-face to face with patient     DSandrea Hughs NP

## 2020-09-06 ENCOUNTER — Encounter: Payer: Self-pay | Admitting: Family

## 2020-09-07 ENCOUNTER — Other Ambulatory Visit: Payer: Medicare Other

## 2020-09-07 ENCOUNTER — Other Ambulatory Visit: Payer: Self-pay

## 2020-09-07 ENCOUNTER — Ambulatory Visit (INDEPENDENT_AMBULATORY_CARE_PROVIDER_SITE_OTHER): Payer: Medicare Other | Admitting: Family

## 2020-09-07 ENCOUNTER — Encounter: Payer: Self-pay | Admitting: Family

## 2020-09-07 VITALS — BP 100/80 | HR 63 | Temp 97.7°F | Resp 16 | Ht 59.0 in | Wt 73.4 lb

## 2020-09-07 DIAGNOSIS — I209 Angina pectoris, unspecified: Secondary | ICD-10-CM | POA: Diagnosis not present

## 2020-09-07 DIAGNOSIS — E039 Hypothyroidism, unspecified: Secondary | ICD-10-CM

## 2020-09-07 DIAGNOSIS — R634 Abnormal weight loss: Secondary | ICD-10-CM

## 2020-09-07 DIAGNOSIS — K529 Noninfective gastroenteritis and colitis, unspecified: Secondary | ICD-10-CM

## 2020-09-07 DIAGNOSIS — I480 Paroxysmal atrial fibrillation: Secondary | ICD-10-CM

## 2020-09-07 DIAGNOSIS — R350 Frequency of micturition: Secondary | ICD-10-CM

## 2020-09-07 LAB — CBC WITH DIFFERENTIAL/PLATELET
Absolute Monocytes: 5174 cells/uL — ABNORMAL HIGH (ref 200–950)
Basophils Absolute: 15 cells/uL (ref 0–200)
Basophils Relative: 0.1 %
Eosinophils Absolute: 15 cells/uL (ref 15–500)
Eosinophils Relative: 0.1 %
HCT: 41.1 % (ref 35.0–45.0)
Hemoglobin: 14.3 g/dL (ref 11.7–15.5)
Lymphs Abs: 1463 cells/uL (ref 850–3900)
MCH: 31.2 pg (ref 27.0–33.0)
MCHC: 34.8 g/dL (ref 32.0–36.0)
MCV: 89.5 fL (ref 80.0–100.0)
MPV: 11.8 fL (ref 7.5–12.5)
Monocytes Relative: 33.6 %
Neutro Abs: 8732 cells/uL — ABNORMAL HIGH (ref 1500–7800)
Neutrophils Relative %: 56.7 %
Platelets: 241 10*3/uL (ref 140–400)
RBC: 4.59 10*6/uL (ref 3.80–5.10)
RDW: 13 % (ref 11.0–15.0)
Total Lymphocyte: 9.5 %
WBC: 15.4 10*3/uL — ABNORMAL HIGH (ref 3.8–10.8)

## 2020-09-07 LAB — POCT URINALYSIS DIPSTICK
Glucose, UA: NEGATIVE
Ketones, UA: POSITIVE
Nitrite, UA: POSITIVE
Protein, UA: POSITIVE — AB
Spec Grav, UA: 1.01 (ref 1.010–1.025)
Urobilinogen, UA: NEGATIVE E.U./dL — AB
pH, UA: 8 (ref 5.0–8.0)

## 2020-09-07 LAB — TSH: TSH: 3.19 mIU/L (ref 0.40–4.50)

## 2020-09-07 MED ORDER — SACCHAROMYCES BOULARDII 250 MG PO CAPS: 250.0000 mg | ORAL_CAPSULE | Freq: Two times a day (BID) | ORAL | 0 refills | Status: AC

## 2020-09-07 MED ORDER — CIPROFLOXACIN HCL 500 MG PO TABS: 500.0000 mg | ORAL_TABLET | Freq: Two times a day (BID) | ORAL | 0 refills | Status: AC

## 2020-09-07 NOTE — Patient Instructions (Signed)
Increase water intake to 6-8 glasses daily   - urine specimen send to lab for culture will call you with results.will need to change antibiotics if culture and sensitivity does not indicate that Cipro is sensitive to infection.   Urinary Frequency, Adult Urinary frequency means urinating more often than usual. You may urinate every 1-2 hours even though you drink a normal amount of fluid and do not have a bladder infection or condition. Although you urinate more often than normal,the total amount of urine produced in a day is normal. With urinary frequency, you may have an urgent need to urinate often. The stress and anxiety of needing to find a bathroom quickly can make this urge worse. This condition may go away on its own or you may need treatment at home. Home treatment may include bladder training, exercises, taking medicines, ormaking changes to your diet. Follow these instructions at home: Bladder health  Keep a bladder diary if told by your health care provider. Keep track of: What you eat and drink. How often you urinate. How much you urinate. Follow a bladder training program if told by your health care provider. This may include: Learning to delay going to the bathroom. Double urinating (voiding). This helps if you are not completely emptying your bladder. Scheduled voiding. Do Kegel exercises as told by your health care provider. Kegel exercises strengthen the muscles that help control urination, which may help the condition.  Eating and drinking If told by your health care provider, make diet changes, such as: Avoiding caffeine. Drinking fewer fluids, especially alcohol. Not drinking in the evening. Avoiding foods or drinks that may irritate the bladder. These include coffee, tea, soda, artificial sweeteners, citrus, tomato-based foods, and chocolate. Eating foods that help prevent or ease constipation. Constipation can make this condition worse. Your health care provider may  recommend that you: Drink enough fluid to keep your urine pale yellow. Take over-the-counter or prescription medicines. Eat foods that are high in fiber, such as beans, whole grains, and fresh fruits and vegetables. Limit foods that are high in fat and processed sugars, such as fried or sweet foods. General instructions Take over-the-counter and prescription medicines only as told by your health care provider. Keep all follow-up visits as told by your health care provider. This is important. Contact a health care provider if: You start urinating more often. You feel pain or irritation when you urinate. You notice blood in your urine. Your urine looks cloudy. You develop a fever. You begin vomiting. Get help right away if: You are unable to urinate. Summary Urinary frequency means urinating more often than usual. With urinary frequency, you may urinate every 1-2 hours even though you drink a normal amount of fluid and do not have a bladder infection or other bladder condition. Your health care provider may recommend that you keep a bladder diary, follow a bladder training program, or make dietary changes. If told by your health care provider, do Kegel exercises to strengthen the muscles that help control urination. Take over-the-counter and prescription medicines only as told by your health care provider. Contact a health care provider if your symptoms do not improve or get worse. This information is not intended to replace advice given to you by your health care provider. Make sure you discuss any questions you have with your healthcare provider. Document Revised: 07/26/2017 Document Reviewed: 07/26/2017 Elsevier Patient Education  2021 [employer].

## 2020-09-07 NOTE — Progress Notes (Signed)
Provider: Peightyn Roberson FNP-C  Wardell Honour, MD  Patient Care Team: Wardell Honour, MD as PCP - General (Family Medicine) [place], Peter M, MD as Consulting Physician (Cardiology)  Extended Emergency Contact Information Primary Emergency Contact: [place] [address] [place] Phone: [phone] Relation: Daughter  Code Status:  DNR Goals of care: Advanced Directive information Advanced Directives 09/06/2020  Does Patient Have a Medical Advance Directive? No  Type of Advance Directive -  Does patient want to make changes to medical advance directive? -  Copy of [place] in Chart? -  Would patient like information on creating a medical advance directive? No - Patient declined     Chief Complaint  Patient presents with   Acute Visit    Complains of itching and urinary frequency.     HPI:  Pt is a 87 y.o. female seen today for an acute visit for evaluation of urine frequency.Had to go x 4 times during the night and once this morning.Has had some burning and urine odor.lower abdomen discomfort.though just recovering from colitis.still has some loose stool but has improved trying to advance her diet.  She denies any fever,chills,nausea,vomiting or flank pain. Care giver reports no increase confusion.    Past Medical History:  Diagnosis Date   Arthritis    GERD (gastroesophageal reflux disease)    History of bone density study    History of mammogram    Hypertension, essential 04/19/2016   Hypothyroid    Left bundle branch block    Osteoporosis    Paroxysmal atrial fibrillation [facility])    Past Surgical History:  Procedure Laterality Date   APPENDECTOMY     CARDIOVASCULAR STRESS TEST  03/24/2009   EF 78%   CHOLECYSTECTOMY     COLONOSCOPY     TONSILLECTOMY AND ADENOIDECTOMY     US ECHOCARDIOGRAPHY  05/07/2008   Est EF 50-55%    Allergies  Allergen Reactions   Rofecoxib     Other reaction(s): Other (See Comments) reflux,  edema    Sulfamethoxazole-Trimethoprim Nausea Only   Codeine Other (See Comments)    unknown Other reaction(s): GI Upset (intolerance)   Estradiol Rash    Local rash   Flurbiprofen     Other reaction(s): GI Upset (intolerance)   Ibuprofen     Other reaction(s): GI Upset (intolerance)    Outpatient Encounter Medications as of 09/07/2020  Medication Sig   ciprofloxacin (CIPRO) 500 MG tablet Take 1 tablet (500 mg total) by mouth 2 (two) times daily for 10 days.   saccharomyces boulardii (FLORASTOR) 250 MG capsule Take 1 capsule (250 mg total) by mouth 2 (two) times daily for 10 days.   acetaminophen (TYLENOL) 500 MG tablet Take 500 mg by mouth every 6 (six) hours as needed for mild pain.   amLODipine (NORVASC) 2.5 MG tablet TAKE 1 TABLET BY MOUTH EVERY DAY   aspirin 81 MG tablet Take 81 mg by mouth daily.   levothyroxine (SYNTHROID, LEVOTHROID) 50 MCG tablet Take 50 mcg by mouth daily before breakfast.   loperamide (IMODIUM A-D) 2 MG capsule Take 1 capsule (2 mg total) by mouth as needed for diarrhea or loose stools.   metoprolol succinate (TOPROL-XL) 25 MG 24 hr tablet TAKE 1 TABLET BY MOUTH EVERY DAY   nitroGLYCERIN (NITROSTAT) 0.4 MG SL tablet Place 1 tablet (0.4 mg total) under the tongue every 5 (five) minutes as needed for chest pain.   zolpidem (AMBIEN) 5 MG tablet Take 2.5 mg by  mouth at bedtime.   No facility-administered encounter medications on file as of 09/07/2020.    Review of Systems  Constitutional:  Negative for appetite change, chills, fatigue and fever.  HENT:  Positive for hearing loss. Negative for congestion and trouble swallowing.   Respiratory:  Negative for cough, chest tightness, shortness of breath and wheezing.   Cardiovascular:  Negative for chest pain, palpitations and leg swelling.  Gastrointestinal:  Negative for abdominal distention, abdominal pain, blood in stool, constipation, nausea and vomiting.       Diarrhea has improved   Genitourinary:  Positive  for dysuria and frequency. Negative for difficulty urinating, flank pain and urgency.  Musculoskeletal:  Positive for gait problem. Negative for arthralgias, back pain and myalgias.  Neurological:  Negative for dizziness, syncope, weakness, light-headedness and headaches.  Hematological:  Does not bruise/bleed easily.  Psychiatric/Behavioral:  Negative for agitation, behavioral problems, confusion, hallucinations and sleep disturbance. The patient is not nervous/anxious.    Immunization History  Administered Date(s) Administered   Influenza Split 11/05/2017   Influenza, High Dose Seasonal PF 10/31/2016, 11/12/2018, 01/05/2020   Influenza,inj,Quad PF,6+ Mos 11/05/2017   Influenza,inj,Quad PF,6-35 Mos 11/05/2017   PFIZER(Purple Top)SARS-COV-2 Vaccination 09/11/2019   Pertinent  Health Maintenance Due  Topic Date Due   DEXA SCAN  Never done   PNA vac Low Risk Adult (1 of 2 - PCV13) Never done   INFLUENZA VACCINE  08/30/2020   Fall Risk  09/06/2020 08/27/2020 08/24/2020  Falls in the past year? 0 0 0  Number falls in past yr: 0 0 0  Injury with Fall? 0 0 0  Risk for fall due to : No Fall Risks No Fall Risks History of fall(s)  Follow up Falls evaluation completed Falls evaluation completed Falls evaluation completed;Education provided;Falls prevention discussed   Functional Status Survey:    Vitals:   09/07/20 0914  BP: 100/80  Pulse: 63  Resp: 16  Temp: 97.7 F (36.5 C)  SpO2: 93%  Weight: 73 lb 6.4 oz (33.3 kg)  Height: '4\' 11"'$  (1.499 m)   Body mass index is 14.83 kg/m. Physical Exam Vitals reviewed.  Constitutional:      General: She is not in acute distress.    Appearance: Normal appearance. She is underweight. She is not ill-appearing or diaphoretic.  HENT:     Head: Normocephalic.     Mouth/Throat:     Mouth: Mucous membranes are moist.     Pharynx: Oropharynx is clear. No oropharyngeal exudate or posterior oropharyngeal erythema.  Eyes:     General: No scleral  icterus.       Right eye: No discharge.        Left eye: No discharge.     Conjunctiva/sclera: Conjunctivae normal.     Pupils: Pupils are equal, round, and reactive to light.  Cardiovascular:     Rate and Rhythm: Normal rate and regular rhythm.     Pulses: Normal pulses.     Heart sounds: Normal heart sounds. No murmur heard.   No friction rub. No gallop.  Pulmonary:     Effort: Pulmonary effort is normal. No respiratory distress.     Breath sounds: Normal breath sounds. No wheezing, rhonchi or rales.  Chest:     Chest wall: No tenderness.  Abdominal:     General: Bowel sounds are normal. There is no distension.     Palpations: Abdomen is soft. There is no mass.     Tenderness: There is abdominal tenderness in the suprapubic area.  There is no right CVA tenderness, left CVA tenderness, guarding or rebound.  Musculoskeletal:        General: No swelling or tenderness. Normal range of motion.     Right lower leg: No edema.     Left lower leg: No edema.     Comments: Unsteady gait ambulates with a rolling walker   Skin:    General: Skin is warm and dry.     Coloration: Skin is not pale.     Findings: No bruising, erythema or rash.  Neurological:     Mental Status: She is alert and oriented to person, place, and time.     Motor: No weakness.     Gait: Gait abnormal.  Psychiatric:        Mood and Affect: Mood normal.        Speech: Speech normal.        Behavior: Behavior normal.        Thought Content: Thought content normal.        Judgment: Judgment normal.    Labs reviewed: Recent Labs    08/21/20 1451  NA 134*  K 4.3  CL 99  CO2 20*  GLUCOSE 100*  BUN 20  CREATININE 1.12*  CALCIUM 9.4   Recent Labs    08/21/20 1451  AST 22  ALT 10  ALKPHOS 45  BILITOT 0.5  PROT 6.5  ALBUMIN 4.0   Recent Labs    08/21/20 1451  WBC 23.7*  NEUTROABS 16.2*  HGB 14.6  HCT 43.7  MCV 90.5  PLT 216   Lab Results  Component Value Date   TSH 3.060 04/06/2017   No  results found for: HGBA1C Lab Results  Component Value Date   CHOL 165 04/06/2017   HDL 76 04/06/2017   LDLCALC 75 04/06/2017   TRIG 69 04/06/2017   CHOLHDL 2.2 05/06/2008    Significant Diagnostic Results in last 30 days:  CT ABDOMEN PELVIS W CONTRAST  Result Date: 08/21/2020 CLINICAL DATA:  Lower abdominal pain. EXAM: CT ABDOMEN AND PELVIS WITH CONTRAST TECHNIQUE: Multidetector CT imaging of the abdomen and pelvis was performed using the standard protocol following bolus administration of intravenous contrast. CONTRAST:  64m OMNIPAQUE IOHEXOL 350 MG/ML SOLN COMPARISON:  January 25, 2019. FINDINGS: Lower chest: No acute abnormality. Hepatobiliary: No focal liver abnormality is seen. Status post cholecystectomy. No biliary dilatation. Pancreas: Unremarkable. No pancreatic ductal dilatation or surrounding inflammatory changes. Spleen: Normal in size without focal abnormality. Adrenals/Urinary Tract: Adrenal glands are unremarkable. Kidneys are normal, without renal calculi, focal lesion, or hydronephrosis. Bladder is unremarkable. Stomach/Bowel: The stomach appears normal. Status post appendectomy. There is no evidence of bowel obstruction. Mild wall thickening is seen involving the distal descending and proximal sigmoid colon concerning for infectious or inflammatory colitis. Vascular/Lymphatic: Aortic atherosclerosis. No enlarged abdominal or pelvic lymph nodes. Reproductive: Uterus and bilateral adnexa are unremarkable. Other: No abdominal wall hernia or abnormality. No abdominopelvic ascites. Musculoskeletal: No acute or significant osseous findings. IMPRESSION: Probable wall thickening is seen involving the distal descending and proximal sigmoid colon concerning for infectious or inflammatory colitis. Aortic Atherosclerosis (ICD10-I70.0). Electronically Signed   By: JMarijo ConceptionM.D.   On: 08/21/2020 16:42    Assessment/Plan  Urinary frequency Afebrile. Suprapubic tenderness on palpation.  Also bilateral flank pain on percussion  - POC Urinalysis Dipstick shows very cloudy urine with large blood,positive for ketones,proteins ,Nitrites and Large Leukocytes. Start on Cipro 500 mg tablet one by mouth twice daily x 7 days  Advised to Take along with probiotics Florastor 250 mg capsule one by mouth twice daily x 10 days to prevent antibiotics associated diarrhea  Made aware will send urine for culture then will call with results in 3 days might need to switch antibiotics if culture and sensitive indicates not sensitive to Cipro.verbalized understanding.  - Advised to Increased water intake to 6-8 glasses daily  - Urine Culture  Family/ staff Communication: Reviewed plan of care with patient verbalized understanding.   Labs/tests ordered:  - POC Urinalysis Dipstick  - Urine Culture  Next Appointment: As needed if symptoms worsen or fail to improve    Sandrea Hughs, NP

## 2020-09-10 LAB — URINE CULTURE
MICRO NUMBER:: 12220069
SPECIMEN QUALITY:: ADEQUATE

## 2020-10-07 ENCOUNTER — Telehealth: Payer: Self-pay | Admitting: *Deleted

## 2020-10-07 NOTE — Telephone Encounter (Signed)
Opal Sidles, daughter, called and stated that patient has been having Colitis. Has had diarrhea for about 2-3 days with no relief from Imodium.   Last night patient went to the bathroom and passed out on the toilet.  911 was called and patient was evaluated. Bp was up.   Daughter was calling to see if there is any other medication patient could take for the Colitis.   I offered an appointment for today but daughter stated that she lives in [place] and would call patient to find a time that would suit her to come in.  Stated that she would call back to schedule an appointment.   Forwarded to [place] due to Dr. Sabra Heck out of office.

## 2020-10-07 NOTE — Telephone Encounter (Signed)
Agree she needs an OV for evaluation

## 2020-10-07 NOTE — Telephone Encounter (Signed)
Spoke with Gina Terry who mentioned that she spoke with her mother who has decided that she will call if she feels an appointment is needed otherwise she will manage symptoms for now. I informed Gina Terry that it would be best to call early am to increases chances of being seen same day.

## 2020-11-01 ENCOUNTER — Other Ambulatory Visit: Payer: Self-pay | Admitting: Cardiology

## 2020-11-08 ENCOUNTER — Other Ambulatory Visit: Payer: Self-pay | Admitting: *Deleted

## 2020-11-08 MED ORDER — ZOLPIDEM TARTRATE 5 MG PO TABS
2.5000 mg | ORAL_TABLET | Freq: Every day | ORAL | 0 refills | Status: DC
Start: 2020-11-08 — End: 2020-12-03

## 2020-11-08 NOTE — Telephone Encounter (Signed)
Patient requested refill. Stated that she has ran out.  No contract on file, note added to upcoming appointment.  Pended Rx and sent to [facility] for approval due to Dr. Sabra Heck out of office.

## 2020-11-17 ENCOUNTER — Telehealth: Payer: Self-pay

## 2020-11-17 NOTE — Telephone Encounter (Signed)
So the amount that was previously prescribed is the most she can get?

## 2020-11-17 NOTE — Telephone Encounter (Signed)
Patient states that her prescription for zolpidem was sent in for only 15 tablets. The prescription was sent in with direction of take 1/2 tablet at bedtime. Patient states that she takes 1 and 1/2 tablet one at bedtime and one at 3:00 in the morning. Patient would like 3 months supply of medication.Will need to rewrite prescription. Please advise  Medication pended and sent to Dr. Alain Honey, for approval.

## 2020-11-17 NOTE — Telephone Encounter (Signed)
Forwarded message to Clinical Intake, Evie

## 2020-11-17 NOTE — Telephone Encounter (Signed)
When I try to fill RX as pt requests, I get a limit on how much pt >65 can take(5mg )

## 2020-11-18 NOTE — Telephone Encounter (Signed)
Spoke with patient and she stated that she is going to call pharmacy about medication and the limits on medication

## 2020-12-01 ENCOUNTER — Other Ambulatory Visit: Payer: Self-pay | Admitting: Nurse Practitioner

## 2020-12-01 NOTE — Telephone Encounter (Signed)
Patient has request refill on medication "Ambien". Patient last refill was 11/08/2020. Patient doesn't have Non Opioid Contract on file. Medication pend and sent to PCP Sabra Heck Lillette Boxer, MD for approval.

## 2020-12-06 NOTE — Telephone Encounter (Signed)
Patient called and wanted to know if her Zolpidem Rx can be changed to One tablet once daily instead of the 1/2 tablets once daily.   Stated that she is running out because she takes One tablet at bedtime.   Please Advise.

## 2020-12-07 NOTE — Telephone Encounter (Addendum)
Gina Honour, MD  You 1 hour ago (8:15 AM)   Faythe Ghee to do what pt requests

## 2020-12-08 ENCOUNTER — Other Ambulatory Visit: Payer: Self-pay | Admitting: *Deleted

## 2020-12-08 MED ORDER — ZOLPIDEM TARTRATE 5 MG PO TABS: 5.0000 mg | ORAL_TABLET | Freq: Every day | ORAL | 3 refills | Status: DC

## 2020-12-08 NOTE — Telephone Encounter (Signed)
Patient called and wanted a refill Zolpidem, stated that she is taking a whole tablet and is out.   Dr. Sabra Heck oked the whole tablet at bedtime.  Pended Rx and sent to Dr. Sabra Heck for approval.  Patient is out of medication.

## 2021-02-23 ENCOUNTER — Other Ambulatory Visit: Payer: Self-pay

## 2021-02-23 ENCOUNTER — Ambulatory Visit (INDEPENDENT_AMBULATORY_CARE_PROVIDER_SITE_OTHER): Payer: Medicare Other | Admitting: Family Medicine

## 2021-02-23 ENCOUNTER — Encounter: Payer: Self-pay | Admitting: Family Medicine

## 2021-02-23 VITALS — BP 140/90 | HR 64 | Temp 97.9°F | Ht 59.0 in | Wt 73.0 lb

## 2021-02-23 DIAGNOSIS — Z23 Encounter for immunization: Secondary | ICD-10-CM | POA: Diagnosis not present

## 2021-02-23 DIAGNOSIS — I480 Paroxysmal atrial fibrillation: Secondary | ICD-10-CM | POA: Diagnosis not present

## 2021-02-23 DIAGNOSIS — N951 Menopausal and female climacteric states: Secondary | ICD-10-CM

## 2021-02-23 DIAGNOSIS — E039 Hypothyroidism, unspecified: Secondary | ICD-10-CM | POA: Diagnosis not present

## 2021-02-23 NOTE — Patient Instructions (Signed)
Stop Synthroid and come back in 1 month to do labs to see if you need to continue thyroid medicine

## 2021-02-23 NOTE — Progress Notes (Signed)
Provider:  Alain Honey, MD  Careteam: Patient Care Team: Wardell Honour, MD as PCP - General (Family Medicine) Martinique, Peter M, MD as Consulting Physician (Cardiology)  PLACE OF SERVICE:  [place] Directive information Does Patient Have a Medical Advance Directive?: Yes, Type of Advance Directive: [place];Living will, Does patient want to make changes to medical advance directive?: No - Patient declined  Allergies  Allergen Reactions   Rofecoxib     Other reaction(s): Other (See Comments) reflux, edema    Sulfamethoxazole-Trimethoprim Nausea Only   Codeine Other (See Comments)    unknown Other reaction(s): GI Upset (intolerance)   Estradiol Rash    Local rash   Flurbiprofen     Other reaction(s): GI Upset (intolerance)   Ibuprofen     Other reaction(s): GI Upset (intolerance)    Chief Complaint  Patient presents with   Medical Management of Chronic Issues    6 month follow-up. Sign treatment agreement for Ambien. Patient c/o of SOB with activity (after standing for a long period) of time Discuss need for td/tdap, shingrix,PCV, DEXA, and covid boosters. Flu vaccine today.     HPI: Patient is a 88 y.o. female This is a 51-month follow-up for this delightful 88 year old female.  She lives by herself but has home health folks to help with housework and cooking.  She has a list of questions today; First she is concerned about short-term memory loss.  I encouraged her to use sticky notes and write down her concerns but I think she is normal for age having spent 30 minutes or so with her at her visit today. She is concerned about cost of tradename Synthroid and wonders if we could help.  She has never had symptoms of hypothyroid.  Rather, I think this was a lab diagnosis.  I suggested we might stop the Synthroid check her TSH in a month and see if she needs to continue but with the generic if necessary. We also discussed her need for Ambien  she has been on for many years.  She has had no falls recently and does not feel drugged the next morning. Her appetite is unchanged as is her weight.   Review of Systems  Constitutional:  Positive for malaise/fatigue.  Respiratory: Negative.    Cardiovascular:  Positive for palpitations.  Musculoskeletal: Negative.   Psychiatric/Behavioral:  The patient has insomnia.   All other systems reviewed and are negative.  Past Medical History:  Diagnosis Date   Arthritis    GERD (gastroesophageal reflux disease)    History of bone density study    History of mammogram    Hypertension, essential 04/19/2016   Hypothyroid    Left bundle branch block    Osteoporosis    Paroxysmal atrial fibrillation [facility])    Past Surgical History:  Procedure Laterality Date   APPENDECTOMY     CARDIOVASCULAR STRESS TEST  03/24/2009   EF 78%   CHOLECYSTECTOMY     COLONOSCOPY     TONSILLECTOMY AND ADENOIDECTOMY     US ECHOCARDIOGRAPHY  05/07/2008   Est EF 50-55%   Social History:   reports that she quit smoking about 37 years ago. Her smoking use included cigarettes. She has never used smokeless tobacco. She reports current alcohol use. She reports that she does not use drugs.  Family History  Problem Relation Age of Onset   Hip fracture Mother    Heart attack Father    Breast cancer Sister  Stroke Brother     Medications: Patient's Medications  New Prescriptions   No medications on file  Previous Medications   ACETAMINOPHEN (TYLENOL) 500 MG TABLET    Take 500 mg by mouth every 6 (six) hours as needed for mild pain.   AMLODIPINE (NORVASC) 2.5 MG TABLET    TAKE 1 TABLET BY MOUTH EVERY DAY   ASPIRIN 81 MG TABLET    Take 81 mg by mouth daily.   LEVOTHYROXINE (SYNTHROID, LEVOTHROID) 50 MCG TABLET    Take 50 mcg by mouth daily before breakfast.   LOPERAMIDE (IMODIUM A-D) 2 MG CAPSULE    Take 1 capsule (2 mg total) by mouth as needed for diarrhea or loose stools.   METOPROLOL SUCCINATE  (TOPROL-XL) 25 MG 24 HR TABLET    TAKE 1 TABLET BY MOUTH EVERY DAY   NITROGLYCERIN (NITROSTAT) 0.4 MG SL TABLET    Place 1 tablet (0.4 mg total) under the tongue every 5 (five) minutes as needed for chest pain.   ZOLPIDEM (AMBIEN) 5 MG TABLET    Take 1 tablet (5 mg total) by mouth at bedtime.  Modified Medications   No medications on file  Discontinued Medications   No medications on file    Physical Exam:  Vitals:   02/23/21 1034  BP: 140/90  Pulse: 64  Temp: 97.9 F (36.6 C)  TempSrc: Temporal  SpO2: 99%  Weight: 73 lb (33.1 kg)  Height: 4\' 11"  (1.499 m)   Body mass index is 14.74 kg/m. Wt Readings from Last 3 Encounters:  02/23/21 73 lb (33.1 kg)  09/07/20 73 lb 6.4 oz (33.3 kg)  08/24/20 76 lb 9.6 oz (34.7 kg)    Physical Exam Vitals and nursing note reviewed.  Constitutional:      Appearance: Normal appearance.  Cardiovascular:     Rate and Rhythm: Normal rate and regular rhythm.  Pulmonary:     Effort: Pulmonary effort is normal.     Breath sounds: Normal breath sounds.  Musculoskeletal:     Comments: Uses walker for ambulation  Neurological:     General: No focal deficit present.     Mental Status: She is alert and oriented to person, place, and time.  Psychiatric:        Mood and Affect: Mood normal.        Behavior: Behavior normal.    Labs reviewed: Basic Metabolic Panel: Recent Labs    08/21/20 1451 09/07/20 0935  NA 134*  --   K 4.3  --   CL 99  --   CO2 20*  --   GLUCOSE 100*  --   BUN 20  --   CREATININE 1.12*  --   CALCIUM 9.4  --   TSH  --  3.19   Liver Function Tests: Recent Labs    08/21/20 1451  AST 22  ALT 10  ALKPHOS 45  BILITOT 0.5  PROT 6.5  ALBUMIN 4.0   Recent Labs    08/21/20 1451  LIPASE 42   No results for input(s): AMMONIA in the last 8760 hours. CBC: Recent Labs    08/21/20 1451 09/07/20 0935  WBC 23.7* 15.4*  NEUTROABS 16.2* 8,732*  HGB 14.6 14.3  HCT 43.7 41.1  MCV 90.5 89.5  PLT 216 241    Lipid Panel: No results for input(s): CHOL, HDL, LDLCALC, TRIG, CHOLHDL, LDLDIRECT in the last 8760 hours. TSH: Recent Labs    09/07/20 0935  TSH 3.19   A1C: No results found for:  HGBA1C   Assessment/Plan 1. Need for influenza vaccination Flu shot administered  2. Primary hypothyroidism Plan is to hold thyroid supplement and recheck TSH and determine need based on lab  3. Insomnia associated with menopause Refill Ambien 5 mg patient has signed a drug contract  4. Paroxysmal atrial fibrillation (HCC) She takes metoprolol as rate control or and baby aspirin as anticoagulant    Alain Honey, MD [facility] [phone]  This is a 9-month follow-up for this delightful 88 year old female.  She lives by herself but has home health folks to help with housework and cooking.  She has a list of questions today; First she is concerned about short-term memory loss.  I encouraged her to use sticky notes and write down her concerns but I think she is normal for age having spent 30 minutes or so with her at her visit today. She is concerned about cost of tradename Synthroid and wonders if we could help.  She has never had symptoms of hypothyroid.  Rather, I think this was a lab diagnosis.  I suggested we might stop the Synthroid check her TSH in a month and see if she needs to continue but with the generic if necessary. We also discussed her need for Ambien she has been on for many years.  She has had no falls recently and does not feel drugged the next morning. Her appetite is unchanged as is her weight.

## 2021-03-23 ENCOUNTER — Other Ambulatory Visit: Payer: Medicare Other

## 2021-03-25 ENCOUNTER — Other Ambulatory Visit: Payer: Self-pay

## 2021-03-25 ENCOUNTER — Other Ambulatory Visit: Payer: Medicare Other

## 2021-03-25 DIAGNOSIS — E039 Hypothyroidism, unspecified: Secondary | ICD-10-CM

## 2021-03-25 LAB — TSH: TSH: 7.57 mIU/L — ABNORMAL HIGH (ref 0.40–4.50)

## 2021-03-26 ENCOUNTER — Other Ambulatory Visit: Payer: Self-pay | Admitting: Cardiology

## 2021-03-26 ENCOUNTER — Other Ambulatory Visit: Payer: Self-pay | Admitting: Family Medicine

## 2021-03-28 ENCOUNTER — Other Ambulatory Visit: Payer: Self-pay

## 2021-03-28 ENCOUNTER — Other Ambulatory Visit: Payer: Self-pay | Admitting: Family Medicine

## 2021-03-28 DIAGNOSIS — E039 Hypothyroidism, unspecified: Secondary | ICD-10-CM

## 2021-03-28 MED ORDER — LEVOTHYROXINE SODIUM 50 MCG PO TABS: 50.0000 ug | ORAL_TABLET | Freq: Every day | ORAL | 2 refills | Status: DC

## 2021-03-28 NOTE — Telephone Encounter (Signed)
Patient has request refill on medication "Ambien 5mg ". Patient last refill dated 12/08/2020. Patient has Non Opioid Contract on file dated 02/25/2021. Medication pend and sent to Sherrie Mustache, NP due to PCP Sabra Heck Lillette Boxer, MD not being in office. Please Advise.

## 2021-04-07 ENCOUNTER — Other Ambulatory Visit: Payer: Self-pay

## 2021-04-07 MED ORDER — ZOLPIDEM TARTRATE 5 MG PO TABS: 5.0000 mg | ORAL_TABLET | Freq: Every day | ORAL | 0 refills | Status: DC

## 2021-04-07 NOTE — Telephone Encounter (Signed)
RX last refilled on 12/08/2020, 30/3 refills ? ?Treatment agreement on file from January 2023 ?

## 2021-05-18 ENCOUNTER — Other Ambulatory Visit: Payer: Self-pay | Admitting: Cardiology

## 2021-05-18 ENCOUNTER — Other Ambulatory Visit: Payer: Self-pay | Admitting: Nurse Practitioner

## 2021-05-23 ENCOUNTER — Other Ambulatory Visit: Payer: Self-pay | Admitting: Cardiology

## 2021-05-24 NOTE — Telephone Encounter (Signed)
Spoke to patient follow up appointment scheduled with Dr.Jordan 6/2 at 11:00 am.Advised Amlodipine refill sent to pharmacy. ?

## 2021-05-27 ENCOUNTER — Other Ambulatory Visit: Payer: Medicare Other

## 2021-05-27 ENCOUNTER — Telehealth: Payer: Self-pay

## 2021-05-27 NOTE — Telephone Encounter (Signed)
Patient called the office and states medication Ambien '5mg'$  is to be taken 1/2 tablet at bedtime and she is only able to sleep 3-4 hours and wakes up frequently. Patient complains that PCP Wardell Honour, MD changed prescription therapy to 1/2 tablet at bedtime. Patient states that she was previously taking 1 tablet daily at bedtime. Patient states she wants to go back to how prescription was taken previously. I advised patient that recent refill states to take 1 tablet '5MG'$  at bedtime daily. Patient denies that bottle states same instructions. Patient states that the pharmacy only gave her 15 tablets. Patient advise to contact pharmacy because one refill remains on medication and correct tablet count was sent along with correct instructions for prescription. Patient claims that she will run out of medication. Patient states that she will call back Tuesday when PCP Wardell Honour, MD is in office to correct issue. Message routed to PCP as FYI.   ?

## 2021-06-01 NOTE — Telephone Encounter (Signed)
Per PCP : I believe she is supposed to be taking 5 mg of Ambien at bedtime  ?

## 2021-06-01 NOTE — Telephone Encounter (Signed)
Thank you for confirming. Patient notified.  ?

## 2021-06-03 ENCOUNTER — Other Ambulatory Visit: Payer: Medicare Other

## 2021-06-03 ENCOUNTER — Other Ambulatory Visit: Payer: Self-pay

## 2021-06-03 DIAGNOSIS — E039 Hypothyroidism, unspecified: Secondary | ICD-10-CM

## 2021-06-04 LAB — TSH: TSH: 3.39 mIU/L (ref 0.40–4.50)

## 2021-06-26 NOTE — Progress Notes (Unsigned)
Cardiology Office Note   Date:  07/01/2021   ID:  Gina Terry, Gina Terry, MRN [id number]  PCP:  Wardell Honour, MD  Cardiologist:   Valerian Jewel Martinique, MD   Chief Complaint  Patient presents with   Coronary Artery Disease      History of Present Illness: Gina Terry is a 88 y.o. female who is seen for follow up of angina.  She has a history of LBBB and paroxysmal Afib. She had a normal nuclear stress test in 2011. Echo in past apparently OK. She has been managed with rate control and ASA.   She presented in March 2019 with symptoms of progressive angina. She described chest pain in the mid sternal area radiating into her back and down both arms. Symptoms are relieved with rest. Symptoms have progressed to the point where she gets pain walking from one room to the next or brushing her teeth. She states she was no longer doing her grocery shopping because of this. Denied dyspnea or diaphoresis.  Her last episode of Afib occurred in November 2016 and converted spontaneously.  She still lives independently in home by herself. She has one daughter living in [place] and a son living in [place]. Lives at home.  Due to her advanced age an initial conservative approach was pursued. She was started on Toprol XL 50 mg daily and given sl Ntg to use prn. An Echo was ordered in 2019 and showed normal LV function and mild Aortic stenosis.  We later had to reduce the Toprol to 25 mg daily due to bradycardia with HR in the 40s.   On follow up today she  is doing pretty well. She still lives alone with round the clock care givers. She uses a walker. She may have SOB with exertion. She is constipated a lot. Has to strain and sometimes when she strains she breaks out in a sweat and she may pass out. Does take Miralax. Only has chest pain at night when she lies down and it doesn't last long. Rare palpitations.     Past Medical History:  Diagnosis Date   Arthritis    GERD (gastroesophageal reflux  disease)    History of bone density study    History of mammogram    Hypertension, essential 04/19/2016   Hypothyroid    Left bundle branch block    Osteoporosis    Paroxysmal atrial fibrillation [facility])     Past Surgical History:  Procedure Laterality Date   APPENDECTOMY     CARDIOVASCULAR STRESS TEST  03/24/2009   EF 78%   CHOLECYSTECTOMY     COLONOSCOPY     TONSILLECTOMY AND ADENOIDECTOMY     US ECHOCARDIOGRAPHY  05/07/2008   Est EF 50-55%     Current Outpatient Medications  Medication Sig Dispense Refill   acetaminophen (TYLENOL) 500 MG tablet Take 500 mg by mouth every 6 (six) hours as needed for mild pain.     amLODipine (NORVASC) 2.5 MG tablet TAKE 1 TABLET BY MOUTH EVERY DAY 90 tablet 0   aspirin 81 MG tablet Take 81 mg by mouth daily.     levothyroxine (SYNTHROID) 50 MCG tablet Take 1 tablet (50 mcg total) by mouth daily before breakfast. 90 tablet 2   loperamide (IMODIUM A-D) 2 MG capsule Take 1 capsule (2 mg total) by mouth as needed for diarrhea or loose stools. 30 capsule 0   metoprolol succinate (TOPROL-XL) 25 MG 24 hr tablet TAKE 1 TABLET BY MOUTH  EVERY DAY 90 tablet 3   nitroGLYCERIN (NITROSTAT) 0.4 MG SL tablet Place 1 tablet (0.4 mg total) under the tongue every 5 (five) minutes as needed for chest pain. 25 tablet 11   zolpidem (AMBIEN) 5 MG tablet TAKE 1 TABLET BY MOUTH EVERYDAY AT BEDTIME 30 tablet 1   No current facility-administered medications for this visit.    Allergies:   Rofecoxib, Sulfamethoxazole-trimethoprim, Codeine, Estradiol, Flurbiprofen, and Ibuprofen    Social History:  The patient  reports that she quit smoking about 37 years ago. Her smoking use included cigarettes. She has never used smokeless tobacco. She reports current alcohol use. She reports that she does not use drugs.   Family History:  The patient's family history includes Breast cancer in her sister; Heart attack in her father; Hip fracture in her mother; Stroke in her brother.     ROS:  Please see the history of present illness.   Otherwise, review of systems are positive for none.   All other systems are reviewed and negative.    PHYSICAL EXAM: VS:  BP (!) 149/79   Pulse 62   Ht '4\' 11"'$  (1.499 m)   Wt 74 lb (33.6 kg)   SpO2 95%   BMI 14.95 kg/m  , BMI Body mass index is 14.95 kg/m. GEN: Well nourished, well developed WF, in no acute distress  HEENT: normal  Neck: no JVD, bilateral carotid bruits with diminished upstroke, no masses Cardiac:RRR; there is a harsh 6-2/1 systolic murmur heard best at the apex radiating to RUSB and into carotids. Respiratory:  clear to auscultation bilaterally, normal work of breathing GI: soft, nontender, nondistended, + BS, midline abdominal bruit.  MS: no deformity or atrophy  Skin: warm and dry, no rash Neuro:  Strength and sensation are intact Psych: euthymic mood, full affect   EKG:  EKG is not ordered today. NSR with rate 62. LAFB, septal infarct. LVH with repolarization abnormality. No change compared to 2020.  I have personally reviewed and interpreted this study.    Recent Labs: 08/21/2020: ALT 10; BUN 20; Creatinine, Ser 1.12; Potassium 4.3; Sodium 134 09/07/2020: Hemoglobin 14.3; Platelets 241 06/03/2021: TSH 3.39    Lipid Panel    Component Value Date/Time   CHOL 165 04/06/2017 1420   TRIG 69 04/06/2017 1420   HDL 76 04/06/2017 1420   CHOLHDL 2.2 05/06/2008 0425   VLDL 9 05/06/2008 0425   LDLCALC 75 04/06/2017 1420      Wt Readings from Last 3 Encounters:  07/01/21 74 lb (33.6 kg)  02/23/21 73 lb (33.1 kg)  09/07/20 73 lb 6.4 oz (33.3 kg)      Other studies Reviewed: Additional studies/ records that were reviewed today include:  Labs dated 09/27/15: cholesterol 192, triglycerides 91, HDL 93, LDL 100. Chemistries, TSH, Hgb normal.   Echo 04/18/17: Study Conclusions   - Left ventricle: The cavity size was normal. Wall thickness was   increased in a pattern of mild LVH. Septal bounce suggestive  of   LBBB, otherwise normal wall motion. Systolic function was normal.   The estimated ejection fraction was in the range of 55% to 60%.   Doppler parameters are consistent with abnormal left ventricular   relaxation (grade 1 diastolic dysfunction). - Aortic valve: Trileaflet; severely calcified leaflets. There was   mild stenosis. Mean gradient (S): 12 mm Hg. Peak gradient (S): 19   mm Hg. Valve area (VTI): 1.86 cm^2. - Mitral valve: Mildly calcified annulus. There was no significant   regurgitation. -  Left atrium: The atrium was mildly dilated. - Right ventricle: The cavity size was normal. Systolic function   was normal. - Tricuspid valve: Peak RV-RA gradient (S): 27 mm Hg. - Pulmonary arteries: PA peak pressure: 30 mm Hg (S). - Inferior vena cava: The vessel was normal in size. The   respirophasic diameter changes were in the normal range (= 50%),   consistent with normal central venous pressure. - Pericardium, extracardiac: A trivial pericardial effusion was   identified.   Impressions:   - Normal LV size with mild LV hypertrophy. EF 55-60% with septal   bounce suggestive of LBBB. Normal RV size and systolic function.   Mild aortic stenosis.     ASSESSMENT AND PLAN:  1.  Angina pectoris- symptoms are stable on beta blocker and amlodipine. Agree with taking these at different times to avoid low BP.  Given sl Ntg to use prn and instructed in its use.   2. Mild Aortic stenosis.  3. Paroxysmal Afib. Now on Toprol XL for rate control.  No recurrence since 2016.  4. LBBB. Resolved today.  5. Vasovagal episodes related to straining at stools. Recommend she use Miralax and stool softener daily to avoid.    Current medicines are reviewed at length with the patient today.  The patient does not have concerns regarding medicines.  The following changes have been made:  See above  Labs/ tests ordered today include:   No orders of the defined types were placed in this  encounter.    Disposition:   FU with me in one year  Signed, Jasyah Theurer Martinique, MD  07/01/2021 11:26 AM    [facility] [address] [place], [place], [address] Phone [phone], Fax [phone]

## 2021-07-01 ENCOUNTER — Ambulatory Visit (INDEPENDENT_AMBULATORY_CARE_PROVIDER_SITE_OTHER): Payer: Medicare Other | Admitting: Cardiology

## 2021-07-01 ENCOUNTER — Encounter: Payer: Self-pay | Admitting: Cardiology

## 2021-07-01 VITALS — BP 149/79 | HR 62 | Ht 59.0 in | Wt 74.0 lb

## 2021-07-01 DIAGNOSIS — I350 Nonrheumatic aortic (valve) stenosis: Secondary | ICD-10-CM | POA: Diagnosis not present

## 2021-07-01 DIAGNOSIS — I209 Angina pectoris, unspecified: Secondary | ICD-10-CM

## 2021-07-01 DIAGNOSIS — I480 Paroxysmal atrial fibrillation: Secondary | ICD-10-CM | POA: Diagnosis not present

## 2021-07-01 MED ORDER — AMLODIPINE BESYLATE 2.5 MG PO TABS: 2.5000 mg | ORAL_TABLET | Freq: Every day | ORAL | 0 refills | Status: DC

## 2021-07-01 NOTE — Patient Instructions (Signed)
Medication Instructions:  No changes *If you need a refill on your cardiac medications before your next appointment, please call your pharmacy*   Lab Work: Not needed    Testing/Procedures: Not needed   Follow-Up: At [facility], you and your health needs are our priority.  As part of our continuing mission to provide you with exceptional heart care, we have created designated Provider Care Teams.  These Care Teams include your primary Cardiologist (physician) and Advanced Practice Providers (APPs -  Physician Assistants and Nurse Practitioners) who all work together to provide you with the care you need, when you need it.  We recommend signing up for the patient portal called "MyChart".  Sign up information is provided on this After Visit Summary.  MyChart is used to connect with patients for Virtual Visits (Telemedicine).  Patients are able to view lab/test results, encounter notes, upcoming appointments, etc.  Non-urgent messages can be sent to your provider as well.   To learn more about what you can do with MyChart, go to [email or web address].    Your next appointment:   12 month(s)  The format for your next appointment:   In Person  Provider:   Peter Martinique, MD    Important Information About [place]

## 2021-07-13 ENCOUNTER — Other Ambulatory Visit: Payer: Self-pay

## 2021-07-13 ENCOUNTER — Encounter (HOSPITAL_COMMUNITY): Payer: Self-pay | Admitting: Emergency Medicine

## 2021-07-13 ENCOUNTER — Observation Stay (HOSPITAL_COMMUNITY)
Admission: EM | Admit: 2021-07-13 | Discharge: 2021-07-15 | Disposition: A | Discharge status: Home / Self Care | Payer: Medicare Other | Attending: Family Medicine | Admitting: Family Medicine

## 2021-07-13 ENCOUNTER — Emergency Department (HOSPITAL_COMMUNITY): Payer: Medicare Other

## 2021-07-13 DIAGNOSIS — F40240 Claustrophobia: Secondary | ICD-10-CM | POA: Insufficient documentation

## 2021-07-13 DIAGNOSIS — Z66 Do not resuscitate: Secondary | ICD-10-CM | POA: Diagnosis not present

## 2021-07-13 DIAGNOSIS — E875 Hyperkalemia: Secondary | ICD-10-CM | POA: Diagnosis not present

## 2021-07-13 DIAGNOSIS — Z87891 Personal history of nicotine dependence: Secondary | ICD-10-CM | POA: Insufficient documentation

## 2021-07-13 DIAGNOSIS — M810 Age-related osteoporosis without current pathological fracture: Secondary | ICD-10-CM | POA: Insufficient documentation

## 2021-07-13 DIAGNOSIS — R8290 Unspecified abnormal findings in urine: Secondary | ICD-10-CM | POA: Diagnosis not present

## 2021-07-13 DIAGNOSIS — I447 Left bundle-branch block, unspecified: Secondary | ICD-10-CM | POA: Insufficient documentation

## 2021-07-13 DIAGNOSIS — R479 Unspecified speech disturbances: Secondary | ICD-10-CM | POA: Diagnosis present

## 2021-07-13 DIAGNOSIS — I480 Paroxysmal atrial fibrillation: Secondary | ICD-10-CM

## 2021-07-13 DIAGNOSIS — E039 Hypothyroidism, unspecified: Secondary | ICD-10-CM

## 2021-07-13 DIAGNOSIS — G8929 Other chronic pain: Secondary | ICD-10-CM | POA: Diagnosis not present

## 2021-07-13 DIAGNOSIS — Z823 Family history of stroke: Secondary | ICD-10-CM | POA: Insufficient documentation

## 2021-07-13 DIAGNOSIS — R7989 Other specified abnormal findings of blood chemistry: Secondary | ICD-10-CM | POA: Diagnosis not present

## 2021-07-13 DIAGNOSIS — M545 Low back pain, unspecified: Secondary | ICD-10-CM | POA: Insufficient documentation

## 2021-07-13 DIAGNOSIS — I272 Pulmonary hypertension, unspecified: Secondary | ICD-10-CM | POA: Insufficient documentation

## 2021-07-13 DIAGNOSIS — R011 Cardiac murmur, unspecified: Secondary | ICD-10-CM | POA: Insufficient documentation

## 2021-07-13 DIAGNOSIS — E785 Hyperlipidemia, unspecified: Secondary | ICD-10-CM | POA: Insufficient documentation

## 2021-07-13 DIAGNOSIS — R54 Age-related physical debility: Secondary | ICD-10-CM | POA: Insufficient documentation

## 2021-07-13 DIAGNOSIS — I10 Essential (primary) hypertension: Secondary | ICD-10-CM

## 2021-07-13 DIAGNOSIS — Z7982 Long term (current) use of aspirin: Secondary | ICD-10-CM | POA: Insufficient documentation

## 2021-07-13 DIAGNOSIS — R4702 Dysphasia: Secondary | ICD-10-CM | POA: Diagnosis not present

## 2021-07-13 DIAGNOSIS — I444 Left anterior fascicular block: Secondary | ICD-10-CM | POA: Insufficient documentation

## 2021-07-13 DIAGNOSIS — I350 Nonrheumatic aortic (valve) stenosis: Secondary | ICD-10-CM | POA: Insufficient documentation

## 2021-07-13 DIAGNOSIS — R2689 Other abnormalities of gait and mobility: Secondary | ICD-10-CM | POA: Insufficient documentation

## 2021-07-13 DIAGNOSIS — Z8249 Family history of ischemic heart disease and other diseases of the circulatory system: Secondary | ICD-10-CM | POA: Insufficient documentation

## 2021-07-13 DIAGNOSIS — R269 Unspecified abnormalities of gait and mobility: Secondary | ICD-10-CM | POA: Insufficient documentation

## 2021-07-13 DIAGNOSIS — Z79899 Other long term (current) drug therapy: Secondary | ICD-10-CM | POA: Diagnosis not present

## 2021-07-13 DIAGNOSIS — R4781 Slurred speech: Secondary | ICD-10-CM | POA: Insufficient documentation

## 2021-07-13 DIAGNOSIS — I119 Hypertensive heart disease without heart failure: Secondary | ICD-10-CM | POA: Insufficient documentation

## 2021-07-13 DIAGNOSIS — G459 Transient cerebral ischemic attack, unspecified: Secondary | ICD-10-CM | POA: Diagnosis not present

## 2021-07-13 DIAGNOSIS — R002 Palpitations: Secondary | ICD-10-CM | POA: Insufficient documentation

## 2021-07-13 DIAGNOSIS — R9431 Abnormal electrocardiogram [ECG] [EKG]: Secondary | ICD-10-CM | POA: Insufficient documentation

## 2021-07-13 DIAGNOSIS — R41841 Cognitive communication deficit: Secondary | ICD-10-CM | POA: Insufficient documentation

## 2021-07-13 DIAGNOSIS — R2681 Unsteadiness on feet: Secondary | ICD-10-CM | POA: Insufficient documentation

## 2021-07-13 LAB — URINALYSIS, ROUTINE W REFLEX MICROSCOPIC
Bilirubin Urine: NEGATIVE
Glucose, UA: NEGATIVE mg/dL
Ketones, ur: NEGATIVE mg/dL
Leukocytes,Ua: NEGATIVE
Nitrite: NEGATIVE
Protein, ur: NEGATIVE mg/dL
Specific Gravity, Urine: 1.005 (ref 1.005–1.030)
pH: 7 (ref 5.0–8.0)

## 2021-07-13 LAB — COMPREHENSIVE METABOLIC PANEL
ALT: 18 U/L (ref 0–44)
AST: 44 U/L — ABNORMAL HIGH (ref 15–41)
Albumin: 3.6 g/dL (ref 3.5–5.0)
Alkaline Phosphatase: 42 U/L (ref 38–126)
Anion gap: 9 (ref 5–15)
BUN: 16 mg/dL (ref 8–23)
CO2: 22 mmol/L (ref 22–32)
Calcium: 9 mg/dL (ref 8.9–10.3)
Chloride: 100 mmol/L (ref 98–111)
Creatinine, Ser: 0.87 mg/dL (ref 0.44–1.00)
GFR, Estimated: >60 mL/min (ref >60)
Glucose, Bld: 82 mg/dL (ref 70–99)
Potassium: 5.2 mmol/L — ABNORMAL HIGH (ref 3.5–5.1)
Sodium: 131 mmol/L — ABNORMAL LOW (ref 135–145)
Total Bilirubin: 0.7 mg/dL (ref 0.3–1.2)
Total Protein: 6.2 g/dL — ABNORMAL LOW (ref 6.5–8.1)

## 2021-07-13 LAB — CBC WITH DIFFERENTIAL/PLATELET
Abs Immature Granulocytes: 0.09 10*3/uL — ABNORMAL HIGH (ref 0.00–0.07)
Basophils Absolute: 0 10*3/uL (ref 0.0–0.1)
Basophils Relative: 0 %
Eosinophils Absolute: 0 10*3/uL (ref 0.0–0.5)
Eosinophils Relative: 0 %
HCT: 44.4 % (ref 36.0–46.0)
Hemoglobin: 15 g/dL (ref 12.0–15.0)
Immature Granulocytes: 1 %
Lymphocytes Relative: 18 %
Lymphs Abs: 1.7 10*3/uL (ref 0.7–4.0)
MCH: 31.2 pg (ref 26.0–34.0)
MCHC: 33.8 g/dL (ref 30.0–36.0)
MCV: 92.3 fL (ref 80.0–100.0)
Monocytes Absolute: 2.8 10*3/uL — ABNORMAL HIGH (ref 0.1–1.0)
Monocytes Relative: 29 %
Neutro Abs: 4.9 10*3/uL (ref 1.7–7.7)
Neutrophils Relative %: 52 %
Platelets: 233 10*3/uL (ref 150–400)
RBC: 4.81 MIL/uL (ref 3.87–5.11)
RDW: 13.6 % (ref 11.5–15.5)
WBC: 9.5 10*3/uL (ref 4.0–10.5)
nRBC: 0 % (ref 0.0–0.2)

## 2021-07-13 LAB — CBG MONITORING, ED: Glucose-Capillary: 87 mg/dL (ref 70–99)

## 2021-07-13 MED ORDER — SODIUM CHLORIDE 0.9 % IV SOLN: Freq: Once | INTRAVENOUS | Status: AC

## 2021-07-13 MED ORDER — ACETAMINOPHEN 650 MG RE SUPP: 650.0000 mg | RECTAL | Status: DC | PRN

## 2021-07-13 MED ORDER — STROKE: EARLY STAGES OF RECOVERY BOOK
Freq: Once | Status: AC
Start: 2021-07-14 — End: 2021-07-14
  Filled 2021-07-13: qty 1

## 2021-07-13 MED ORDER — NITROGLYCERIN 0.4 MG SL SUBL: 0.4000 mg | SUBLINGUAL_TABLET | SUBLINGUAL | Status: DC | PRN

## 2021-07-13 MED ORDER — ENOXAPARIN SODIUM 30 MG/0.3ML IJ SOSY
30.0000 mg | PREFILLED_SYRINGE | INTRAMUSCULAR | Status: DC
  Administered 2021-07-14: 30 mg via SUBCUTANEOUS
  Filled 2021-07-13 (×2): qty 0.3

## 2021-07-13 MED ORDER — ZOLPIDEM TARTRATE 5 MG PO TABS
5.0000 mg | ORAL_TABLET | Freq: Every day | ORAL | Status: DC
  Administered 2021-07-13 – 2021-07-14 (×2): 5 mg via ORAL
  Filled 2021-07-13 (×2): qty 1

## 2021-07-13 MED ORDER — ACETAMINOPHEN 325 MG PO TABS
650.0000 mg | ORAL_TABLET | ORAL | Status: DC | PRN
  Administered 2021-07-13: 650 mg via ORAL
  Filled 2021-07-13: qty 2

## 2021-07-13 MED ORDER — ASPIRIN 81 MG PO TBEC
81.0000 mg | DELAYED_RELEASE_TABLET | Freq: Every day | ORAL | Status: DC
  Administered 2021-07-14 – 2021-07-15 (×2): 81 mg via ORAL
  Filled 2021-07-13 (×3): qty 1

## 2021-07-13 MED ORDER — ACETAMINOPHEN 160 MG/5ML PO SOLN: 650.0000 mg | ORAL | Status: DC | PRN

## 2021-07-13 MED ORDER — LOPERAMIDE HCL 2 MG PO CAPS: 2.0000 mg | ORAL_CAPSULE | ORAL | Status: DC | PRN

## 2021-07-13 MED ORDER — LEVOTHYROXINE SODIUM 50 MCG PO TABS
50.0000 ug | ORAL_TABLET | Freq: Every day | ORAL | Status: DC
  Administered 2021-07-14 – 2021-07-15 (×2): 50 ug via ORAL
  Filled 2021-07-13: qty 1
  Filled 2021-07-13: qty 2

## 2021-07-13 NOTE — ED Provider Notes (Signed)
[facility] EMERGENCY DEPARTMENT Provider Note   CSN: [id number] Arrival date & time: 07/13/21  1302     History  Chief Complaint  Patient presents with   Transient Ischemic Attack    Gina Terry is a 88 y.o. female.  HPI     88 year old female with a left bundle branch block, paroxysmal atrial fibrillation, hypertension, hypothyroidism who presents with concern for episode of difficulty speaking.   At 1030 she suddenly could not remember her birthday and that was followed by 15 minutes of not being able to find her words. Couldn't get out what she wanted to say. Was able to say other words but could not find the words to describe what she was feeling.  This lasted about 15 minutes and after that she returned to baseline.  Denies associated numbness, weakness, acute change in vision. Has history of poor balance and has 24 hour caretakers. She normally does use wlker.  Today while in ED does report she felt generally weak getting up and caregiver notes she does seem fatigued at times and at times she has palpitations.  Denies fevers, nausea, vomiting, cough, urinary symptoms.    Reports she is 42 and does not want any heroid measures.   Past Medical History:  Diagnosis Date   Arthritis    GERD (gastroesophageal reflux disease)    History of bone density study    History of mammogram    Hypertension, essential 04/19/2016   Hypothyroid    Left bundle branch block    Osteoporosis    Paroxysmal atrial fibrillation [facility])     Past Surgical History:  Procedure Laterality Date   APPENDECTOMY     CARDIOVASCULAR STRESS TEST  03/24/2009   EF 78%   CHOLECYSTECTOMY     COLONOSCOPY     TONSILLECTOMY AND ADENOIDECTOMY     US ECHOCARDIOGRAPHY  05/07/2008   Est EF 50-55%     Home Medications Prior to Admission medications   Medication Sig Start Date End Date Taking? Authorizing Provider  acetaminophen (TYLENOL) 500 MG tablet Take 500 mg by mouth every 6 (six)  hours as needed for mild pain.   Yes [provider]  amLODipine (NORVASC) 2.5 MG tablet Take 1 tablet (2.5 mg total) by mouth daily. 07/01/21  Yes Martinique, Peter M, MD  aspirin 81 MG tablet Take 81 mg by mouth daily.   Yes [provider]  calcium carbonate (TUMS - DOSED IN MG ELEMENTAL CALCIUM) 500 MG chewable tablet Chew 3 tablets by mouth daily as needed for indigestion or heartburn.   Yes [provider]  levothyroxine (SYNTHROID) 50 MCG tablet Take 1 tablet (50 mcg total) by mouth daily before breakfast. 03/28/21  Yes Wardell Honour, MD  loperamide (IMODIUM A-D) 2 MG capsule Take 1 capsule (2 mg total) by mouth as needed for diarrhea or loose stools. 08/27/20  Yes Ngetich, Dinah C, NP  metoprolol succinate (TOPROL-XL) 25 MG 24 hr tablet TAKE 1 TABLET BY MOUTH EVERY DAY Patient taking differently: Take 25 mg by mouth daily. 11/01/20  Yes Martinique, Peter M, MD  polyethylene glycol (MIRALAX / GLYCOLAX) 17 g packet Take 17 g by mouth daily as needed for mild constipation or moderate constipation.   Yes [provider]  zolpidem (AMBIEN) 5 MG tablet TAKE 1 TABLET BY MOUTH EVERYDAY AT BEDTIME Patient taking differently: Take 5 mg by mouth at bedtime. 05/22/21  Yes Wardell Honour, MD  nitroGLYCERIN (NITROSTAT) 0.4 MG SL tablet Place  1 tablet (0.4 mg total) under the tongue every 5 (five) minutes as needed for chest pain. 02/19/19 01/31/48  Martinique, Peter M, MD      Allergies    Rofecoxib, Sulfamethoxazole-trimethoprim, Codeine, Estradiol, Flurbiprofen, and Ibuprofen    Review of Systems   Review of Systems  Physical Exam Updated Vital Signs BP (!) 167/78   Pulse 60   Temp (!) 97.4 F (36.3 C) (Oral)   Resp 20   Ht '5\' 4"'$  (1.626 m)   Wt 34 kg   SpO2 93%   BMI 12.87 kg/m  Physical Exam Vitals and nursing note reviewed.  Constitutional:      General: She is not in acute distress.    Appearance: She is well-developed. She is not diaphoretic.  HENT:      Head: Normocephalic and atraumatic.  Eyes:     General: No visual field deficit.    Conjunctiva/sclera: Conjunctivae normal.  Cardiovascular:     Rate and Rhythm: Normal rate and regular rhythm.     Heart sounds: Normal heart sounds. No murmur heard.    No friction rub. No gallop.  Pulmonary:     Effort: Pulmonary effort is normal. No respiratory distress.     Breath sounds: Normal breath sounds. No wheezing or rales.  Abdominal:     General: There is no distension.     Palpations: Abdomen is soft.     Tenderness: There is no abdominal tenderness. There is no guarding.  Musculoskeletal:        General: No tenderness.     Cervical back: Normal range of motion.  Skin:    General: Skin is warm and dry.     Findings: No erythema or rash.  Neurological:     Mental Status: She is alert and oriented to person, place, and time.     GCS: GCS eye subscore is 4. GCS verbal subscore is 5. GCS motor subscore is 6.     Cranial Nerves: No cranial nerve deficit, dysarthria or facial asymmetry.     Sensory: Sensation is intact. No sensory deficit.     Motor: Motor function is intact. No weakness or pronator drift.     Coordination: Finger-Nose-Finger Test (deferred) normal.     ED Results / Procedures / Treatments   Labs (all labs ordered are listed, but only abnormal results are displayed) Labs Reviewed  CBC WITH DIFFERENTIAL/PLATELET - Abnormal; Notable for the following components:      Result Value   Monocytes Absolute 2.8 (*)    Abs Immature Granulocytes 0.09 (*)    All other components within normal limits  COMPREHENSIVE METABOLIC PANEL - Abnormal; Notable for the following components:   Sodium 131 (*)    Potassium 5.2 (*)    Total Protein 6.2 (*)    AST 44 (*)    All other components within normal limits  URINALYSIS, ROUTINE W REFLEX MICROSCOPIC - Abnormal; Notable for the following components:   Color, Urine STRAW (*)    Hgb urine dipstick MODERATE (*)    Bacteria, UA RARE (*)     All other components within normal limits  LIPID PANEL  HEMOGLOBIN A1C  CBG MONITORING, ED    EKG EKG Interpretation  Date/Time:  Wednesday July 13 2021 13:07:37 EDT Ventricular Rate:  57 PR Interval:  178 QRS Duration: 115 QT Interval:  470 QTC Calculation: 458 R Axis:   -55 Text Interpretation: Sinus rhythm Consider right atrial enlargement Left anterior fascicular block Confirmed by Lennice Sites (  [id number]) on 07/13/2021 2:59:19 PM  Radiology CT Head Wo Contrast  Result Date: 07/13/2021 CLINICAL DATA:  Transient ischemic attack (TIA) EXAM: CT HEAD WITHOUT CONTRAST TECHNIQUE: Contiguous axial images were obtained from the base of the skull through the vertex without intravenous contrast. RADIATION DOSE REDUCTION: This exam was performed according to the departmental dose-optimization program which includes automated exposure control, adjustment of the mA and/or kV according to patient size and/or use of iterative reconstruction technique. COMPARISON:  None Available. FINDINGS: Motion artifact is present. Brain: There is no acute intracranial hemorrhage, mass effect, or edema. Possible age-indeterminate cortical/subcortical infarcts in the frontoparietal lobes bilaterally. There is no extra-axial fluid collection. Prominence of the ventricles and sulci reflects mild generalized parenchymal volume loss. Patchy and confluent areas of low-density in the supratentorial Stcharles matter are nonspecific may reflect mild to moderate chronic microvascular ischemic changes. Vascular: No definite hyperdense vessel. Skull: Calvarium is unremarkable. Sinuses/Orbits: No acute finding. Other: None. IMPRESSION: Suboptimal evaluation due to motion degradation. No acute intracranial hemorrhage. Possible age-indeterminate cortical/subcortical infarcts in the frontoparietal lobes bilaterally. Electronically Signed   By: Macy Mis M.D.   On: 07/13/2021 14:49    Procedures Procedures    Medications Ordered in  ED Medications   stroke: early stages of recovery book (has no administration in time range)  acetaminophen (TYLENOL) tablet 650 mg (650 mg Oral Given 07/13/21 2044)    Or  acetaminophen (TYLENOL) 160 MG/5ML solution 650 mg ( Per Tube See Alternative 07/13/21 2044)    Or  acetaminophen (TYLENOL) suppository 650 mg ( Rectal See Alternative 07/13/21 2044)  enoxaparin (LOVENOX) injection 30 mg (30 mg Subcutaneous Not Given 07/13/21 1805)  zolpidem (AMBIEN) tablet 5 mg (5 mg Oral Given 07/13/21 2144)  levothyroxine (SYNTHROID) tablet 50 mcg (has no administration in time range)  loperamide (IMODIUM) capsule 2 mg (has no administration in time range)  nitroGLYCERIN (NITROSTAT) SL tablet 0.4 mg (has no administration in time range)  aspirin EC tablet 81 mg (has no administration in time range)  0.9 %  sodium chloride infusion ( Intravenous New Bag/Given 07/13/21 1800)    ED Course/ Medical Decision Making/ A&P Clinical Course as of 07/13/21 2315  Wed Jul 13, 2021  1449 CT Head Wo Contrast [ES]    Clinical Course User Index [ES] Gareth Morgan, MD                           Medical Decision Making Amount and/or Complexity of Data Reviewed External Data Reviewed: labs, radiology and notes. Labs: ordered. Decision-making details documented in ED Course. Radiology: ordered and independent interpretation performed. Decision-making details documented in ED Course. ECG/medicine tests: ordered and independent interpretation performed. Decision-making details documented in ED Course.  Risk Decision regarding hospitalization.    88 year old female with a left bundle branch block, paroxysmal atrial fibrillation, hypertension, hypothyroidism who presents with concern for episode of difficulty speaking.   DDx includes electrolyte abnormalities, anemia, medications, CVA, TIA.  History most consistent with TIA with aphasia lasting 15 minutes without other acute abnormalities.  ABCD2 score of 4.  CT  head completed and personally evaluated by me and shows possible ae indeterminate cortical/subcortical infarcts bilaterally. No sign of ICH.   Labs completed by me show no significant acute electrolyte abnormalities, no sign of UTI, no anemia.  Given moderate risk ABCD2 score feel admission for TIA to expedite work up and discuss medication management/anticoagulation is appropriate.  She is claustrophobic and declines MRI, and  clinically I agree that it will unlikely change management for CVA/TIA question and given age and wishes will defer this.  Additionally, felt that vascular imaging is unlikely to change management as she would not be interested in endarterectomy.  Had extensive discussion with patient, caregiver, and also discussed with daughter on the phone initial plan and history.   She has a history of atrial fibrillation and appears to have not been on anticoagulation and has not had recorded afib since 2016.  Will admit for some continued TIA evaluation, ECHO, cardiac monitoring, Neurology evaluation, discussion of initiating blood thinners and other risk factor modification.            Final Clinical Impression(s) / ED Diagnoses Final diagnoses:  TIA (transient ischemic attack)    Rx / DC Orders ED Discharge Orders     None         Gareth Morgan, MD 07/13/21 2316

## 2021-07-13 NOTE — ED Notes (Signed)
Pt placed on hospital bed for comfort.

## 2021-07-13 NOTE — ED Notes (Signed)
Opal Sidles, daughter, [phone] would like an update on pt

## 2021-07-13 NOTE — ED Triage Notes (Signed)
Pt BIB [facility] from home due to slurred speech and confusion at 1030.  Daughter was on the phone with [facility] stating this was not her baseline.  No hx of blood thinners or previous stroke.  VS BP 160/100, SpO2 98%, CBG 90.

## 2021-07-13 NOTE — Consult Note (Signed)
NEURO HOSPITALIST CONSULT NOTE   Requestig physician: Dr. Tamala Julian  Reason for Consult: Transient dysphasia  History obtained from:  Patient and Chart     HPI:                                                                                                                                          Gina Terry is an 88 y.o. female with a PMHx of PAF, LBBB, HTN, hypothyroidism and osteoporosis who presented to the ED on Wednesday afternoon after an episode of acute confusion and word-finding difficulty. Symptoms started at 1030 manifesting initially with patient being unable to remember her birthday, followed by a 15 minute time period durig which she could not find her words or "get out what she wanted to say". She could use some incorrect words, but could not find the correct words to express what she was feeling. She then returned to baseline.   She has caretakers at home 24 hours per day due in part to her poor balance. She has had some fatigue and diffuse weakness, but denied associated focal weakness, vision changes, or numbness. Also with no fevers, N/V, dysuria or cough.    She is on daily ASA at home.   Past Medical History:  Diagnosis Date   Arthritis    GERD (gastroesophageal reflux disease)    History of bone density study    History of mammogram    Hypertension, essential 04/19/2016   Hypothyroid    Left bundle branch block    Osteoporosis    Paroxysmal atrial fibrillation [facility])     Past Surgical History:  Procedure Laterality Date   APPENDECTOMY     CARDIOVASCULAR STRESS TEST  03/24/2009   EF 78%   CHOLECYSTECTOMY     COLONOSCOPY     TONSILLECTOMY AND ADENOIDECTOMY     US ECHOCARDIOGRAPHY  05/07/2008   Est EF 50-55%    Family History  Problem Relation Age of Onset   Hip fracture Mother    Heart attack Father    Breast cancer Sister    Stroke Brother               Social History:  reports that she quit smoking about 37 years ago. Her  smoking use included cigarettes. She has never used smokeless tobacco. She reports current alcohol use. She reports that she does not use drugs.  Allergies  Allergen Reactions   Rofecoxib     Other reaction(s): Other (See Comments) reflux, edema    Sulfamethoxazole-Trimethoprim Nausea Only   Codeine Other (See Comments)    unknown Other reaction(s): GI Upset (intolerance)   Estradiol Rash    Local rash   Flurbiprofen     Other reaction(s): GI Upset (intolerance)   Ibuprofen  Other reaction(s): GI Upset (intolerance)    MEDICATIONS:                                                                                                                     No current facility-administered medications on file prior to encounter.   Current Outpatient Medications on File Prior to Encounter  Medication Sig Dispense Refill   acetaminophen (TYLENOL) 500 MG tablet Take 500 mg by mouth every 6 (six) hours as needed for mild pain.     amLODipine (NORVASC) 2.5 MG tablet Take 1 tablet (2.5 mg total) by mouth daily. 90 tablet 0   aspirin 81 MG tablet Take 81 mg by mouth daily.     calcium carbonate (TUMS - DOSED IN MG ELEMENTAL CALCIUM) 500 MG chewable tablet Chew 3 tablets by mouth daily as needed for indigestion or heartburn.     levothyroxine (SYNTHROID) 50 MCG tablet Take 1 tablet (50 mcg total) by mouth daily before breakfast. 90 tablet 2   loperamide (IMODIUM A-D) 2 MG capsule Take 1 capsule (2 mg total) by mouth as needed for diarrhea or loose stools. 30 capsule 0   metoprolol succinate (TOPROL-XL) 25 MG 24 hr tablet TAKE 1 TABLET BY MOUTH EVERY DAY (Patient taking differently: Take 25 mg by mouth daily.) 90 tablet 3   polyethylene glycol (MIRALAX / GLYCOLAX) 17 g packet Take 17 g by mouth daily as needed for mild constipation or moderate constipation.     zolpidem (AMBIEN) 5 MG tablet TAKE 1 TABLET BY MOUTH EVERYDAY AT BEDTIME (Patient taking differently: Take 5 mg by mouth at bedtime.) 30 tablet  1   nitroGLYCERIN (NITROSTAT) 0.4 MG SL tablet Place 1 tablet (0.4 mg total) under the tongue every 5 (five) minutes as needed for chest pain. 25 tablet 11    Scheduled:   stroke: early stages of recovery book   Does not apply Once   aspirin EC  81 mg Oral Daily   enoxaparin (LOVENOX) injection  30 mg Subcutaneous Q24H   levothyroxine  50 mcg Oral QAC breakfast   zolpidem  5 mg Oral QHS     ROS:                                                                                                                                       As per HPI.    Blood pressure (!) 167/78, pulse 60, temperature (!)  97.4 F (36.3 C), temperature source Oral, resp. rate 20, height '5\' 4"'$  (1.626 m), weight 34 kg, SpO2 93 %.   General Examination:                                                                                                       Physical Exam  HEENT-  [place]/AT   Lungs- Respirations unlabored Extremities- No edema  Neurological Examination Mental Status: Awake and alert. Oriented x 5. Pleasant and cooperative. Speech is fluent without dysarthria. Able to express ideas clearly. Naming intact. Repetition intact. Comprehension intact.  Cranial Nerves: II: Temporal visual fields intact with no extinction to DSS. PERRL.   III,IV, VI: EOMI with smooth visual pursuits. No nystagmus. No ptosis.  V: Temp sensation equal bilaterally.  VII: Smile symmetric VIII: Hearing intact to voice with hearing aids off (elects not to place her hearing aids back on when asked).  IX,X: No hypophonia XI: Symmetric XII: Midline tongue extension Motor: Decreased muscle bulk x 4.  Tone normal x 4.  4+/5 strength proximally and distally in upper and lower extremities without asymmetry.  Sensory: Temp and light touch intact throughout, bilaterally. No extinction to DSS.  Deep Tendon Reflexes: 0 bilateral achilles. Otherwise 1+ and symmetric throughout. Plantars equivocal bilaterally.  Cerebellar: No ataxia with  FNF bilaterally  Gait: Deferred   Lab Results: Basic Metabolic Panel: Recent Labs  Lab 07/13/21 1315  NA 131*  K 5.2*  CL 100  CO2 22  GLUCOSE 82  BUN 16  CREATININE 0.87  CALCIUM 9.0    CBC: Recent Labs  Lab 07/13/21 1315  WBC 9.5  NEUTROABS 4.9  HGB 15.0  HCT 44.4  MCV 92.3  PLT 233    Cardiac Enzymes: No results for input(s): "CKTOTAL", "CKMB", "CKMBINDEX", "TROPONINI" in the last 168 hours.  Lipid Panel: No results for input(s): "CHOL", "TRIG", "HDL", "CHOLHDL", "VLDL", "LDLCALC" in the last 168 hours.  Imaging: CT Head Wo Contrast  Result Date: 07/13/2021 CLINICAL DATA:  Transient ischemic attack (TIA) EXAM: CT HEAD WITHOUT CONTRAST TECHNIQUE: Contiguous axial images were obtained from the base of the skull through the vertex without intravenous contrast. RADIATION DOSE REDUCTION: This exam was performed according to the departmental dose-optimization program which includes automated exposure control, adjustment of the mA and/or kV according to patient size and/or use of iterative reconstruction technique. COMPARISON:  None Available. FINDINGS: Motion artifact is present. Brain: There is no acute intracranial hemorrhage, mass effect, or edema. Possible age-indeterminate cortical/subcortical infarcts in the frontoparietal lobes bilaterally. There is no extra-axial fluid collection. Prominence of the ventricles and sulci reflects mild generalized parenchymal volume loss. Patchy and confluent areas of low-density in the supratentorial Viele matter are nonspecific may reflect mild to moderate chronic microvascular ischemic changes. Vascular: No definite hyperdense vessel. Skull: Calvarium is unremarkable. Sinuses/Orbits: No acute finding. Other: None. IMPRESSION: Suboptimal evaluation due to motion degradation. No acute intracranial hemorrhage. Possible age-indeterminate cortical/subcortical infarcts in the frontoparietal lobes bilaterally. Electronically Signed   By: Macy Mis M.D.   On: 07/13/2021 14:49     Assessment:  88 year old female presenting with transient dysphasia 1. Neurological exam is nonfocal. Speech is normal. There is mild diffuse 4+/5 weakness in the context of her age-related frailty and decreased muscle bulk.  2. CT head: No acute intracranial abnormality. Possible age-indeterminate cortical/subcortical infarcts in the frontoparietal lobes bilaterally. 3. Patient has refused all work up except for labs and echocardiogram.  - She is claustrophobic and declines MRI.  - Vascular imaging is unlikely to change management as she would not be interested in endarterectomy. - She has a history of atrial fibrillation and appears to have not been on anticoagulation and has not had recorded afib since 2016.  4. Stroke risk factors: Advanced age, PAF and HTN.   Recommendations: 1. Cardiac telemetry 2. TTE 3. Continue ASA 4. May be a candidate for a blood thinner, but also likely to be a significant falls risk. Continuation of ASA +/- addition of Plavix may be the safest option.  5. Stroke Team to follow in the AM.    Electronically signed: Dr. Kerney Elbe 07/13/2021, 11:27 PM

## 2021-07-13 NOTE — H&P (Signed)
History and Physical    Patient: Gina Terry:[id number] DOB: Oct 12, 1921 DOA: 07/13/2021 DOS: the patient was seen and examined on 07/13/2021 PCP: Wardell Honour, MD  Patient coming from: Home via EMS  Chief Complaint:  Chief Complaint  Patient presents with   Transient Ischemic Attack   HPI: Gina Terry is a 88 y.o. female with medical history significant of hypertension, paroxysmal atrial fibrillation on anticoagulation, LBBB, and hypothyroidism who presents after having episode of slurred speech this morning.  At baseline patient is not very ambulatory and gets around with assistance and a walker.  She has caregivers that come look after her during the day.  Sometime around 10:30 AM she recalls being unable to remember her birthday, and shortly thereafter began slurring her words.  She reports that it felt like her tongue was tied and nothing coming out of her mouth was making sense for approximately 10 to 15 minutes prior to self resolving.  She was on the phone with her daughter at the time who led to EMS being called.  During this time the patient denies having any headache, palpitations, chest pain, nausea, vomiting, dysuria, or focal weakness.  Whenever she is getting up and moving around she does report intermittent weak spells where she feels weak and breaks out into a sweat.  She last passed out several months ago while sitting on the toilet.  Approximately 2 weeks ago she developed these sharp pains that felt like electrical shocks in her head, and last week of the similar electrical shock feeling in her right leg.  Patient does report that she has had some pain underneath her right cage on her back is intermittently had palpitations for which she feels that she is in atrial fibrillation.  She had just recently seen Dr. Martinique on 6/2 and no changes were made to her medication regimen.     Upon admission to the emergency department patient was seen to be afebrile, pulse 56-58,  respirations 16-23, blood pressure 189/67, and all other vital signs maintained.  Labs noted sodium 131 potassium 5.2, and AST 44.  CT scan of the brain was suboptimal due to motion, did not note any acute intercranial hemorrhage, and possible age-indeterminate cortical/subcortical infarcts in the frontal parietal lobes bilaterally.  Urinalysis did not show significant signs of infection.  Patient declines MRI as she is claustrophobic.  Review of Systems: As mentioned in the history of present illness. All other systems reviewed and are negative. Past Medical History:  Diagnosis Date   Arthritis    GERD (gastroesophageal reflux disease)    History of bone density study    History of mammogram    Hypertension, essential 04/19/2016   Hypothyroid    Left bundle branch block    Osteoporosis    Paroxysmal atrial fibrillation [facility])    Past Surgical History:  Procedure Laterality Date   APPENDECTOMY     CARDIOVASCULAR STRESS TEST  03/24/2009   EF 78%   CHOLECYSTECTOMY     COLONOSCOPY     TONSILLECTOMY AND ADENOIDECTOMY     US ECHOCARDIOGRAPHY  05/07/2008   Est EF 50-55%   Social History:  reports that she quit smoking about 37 years ago. Her smoking use included cigarettes. She has never used smokeless tobacco. She reports current alcohol use. She reports that she does not use drugs.  Allergies  Allergen Reactions   Rofecoxib     Other reaction(s): Other (See Comments) reflux, edema    Sulfamethoxazole-Trimethoprim Nausea Only  Codeine Other (See Comments)    unknown Other reaction(s): GI Upset (intolerance)   Estradiol Rash    Local rash   Flurbiprofen     Other reaction(s): GI Upset (intolerance)   Ibuprofen     Other reaction(s): GI Upset (intolerance)    Family History  Problem Relation Age of Onset   Hip fracture Mother    Heart attack Father    Breast cancer Sister    Stroke Brother     Prior to Admission medications   Medication Sig Start Date End Date Taking?  Authorizing Provider  acetaminophen (TYLENOL) 500 MG tablet Take 500 mg by mouth every 6 (six) hours as needed for mild pain.    [provider]  amLODipine (NORVASC) 2.5 MG tablet Take 1 tablet (2.5 mg total) by mouth daily. 07/01/21   Martinique, Peter M, MD  aspirin 81 MG tablet Take 81 mg by mouth daily.    [provider]  levothyroxine (SYNTHROID) 50 MCG tablet Take 1 tablet (50 mcg total) by mouth daily before breakfast. 03/28/21   Wardell Honour, MD  loperamide (IMODIUM A-D) 2 MG capsule Take 1 capsule (2 mg total) by mouth as needed for diarrhea or loose stools. 08/27/20   Ngetich, Dinah C, NP  metoprolol succinate (TOPROL-XL) 25 MG 24 hr tablet TAKE 1 TABLET BY MOUTH EVERY DAY 11/01/20   Martinique, Peter M, MD  nitroGLYCERIN (NITROSTAT) 0.4 MG SL tablet Place 1 tablet (0.4 mg total) under the tongue every 5 (five) minutes as needed for chest pain. 02/19/19 01/31/48  Martinique, Peter M, MD  zolpidem (AMBIEN) 5 MG tablet TAKE 1 TABLET BY MOUTH EVERYDAY AT BEDTIME 05/22/21   Wardell Honour, MD    Physical Exam: Vitals:   07/13/21 1308 07/13/21 1345 07/13/21 1415 07/13/21 1516  BP:  (!) 167/68 (!) 153/75 (!) 165/99  Pulse:  (!) 56 (!) 56 (!) 58  Resp:  (!) 23 (!) 21 16  Temp:      TempSrc:      SpO2:  98% 97% 97%  Weight: 34 kg     Height: '5\' 4"'$  (1.626 m)      Exam  Constitutional: Elderly female currently in no acute distress Eyes: PERRL, lids and conjunctivae normal ENMT: Mucous membranes are moist. Posterior pharynx clear of any exudate or lesions. Hard of hearing Neck: normal, supple, no masses,   Respiratory: clear to auscultation bilaterally, no wheezing, no crackles. Normal respiratory effort. No accessory muscle use.  Cardiovascular: Regular rate and rhythm, no murmurs / rubs / gallops. No extremity edema.   Abdomen: No abdominal tenderness. Musculoskeletal: no clubbing / cyanosis.  Thoracic kyphosis present.  Some tenderness palpation underneath the right posterior  back. Skin: no rashes, lesions, ulcers. No induration Neurologic: CN 2-12 grossly intact. Sensation intact, DTR normal. Strength 5/5 in all 4.  Psychiatric: Normal judgment and insight. Alert and oriented x 3. Normal mood.   Data Reviewed:  EKG reveals sinus rhythm at 57 bpm withLeft anterior fascicular block   Assessment and Plan: Suspected TIA Patient presented with complaints of slurred speech lasting 10 to 15 minutes this morning around 10:30 AM that self resolved.  CT scan of the brain concerning for possible age-indeterminate cortical/subcortical infarcts in the frontal parietal lobes.  Patient declined MRI due to being most appropriate. -Admit to a telemetry bed -Stroke order set utilized -Neurochecks -Check echocardiogram -Check hemoglobin A1c and lipid -PT/OT to evaluate and treat -Continue aspirin -Neurology consulted,  will follow-up for any  further recommendations  Paroxysmal atrial fibrillation Patient has a history of paroxysmal atrial fibrillation, but appears to be in sinus rhythm at this time.  Not documented to be in atrial fibrillation since 2016.  She has been on aspirin 81 mg daily.  CHA2DS2-VASc score = 6 with consideration of TIA/stroke, age, HTN, and gender. -Follow-up telemetry overnight -Consider discussing this with neurology and/or cardiology in a.m.  Hyperkalemia Acute.  Potassium 5.2 on admission.  Essential hypertension Home medications include metoprolol succinate 25 mg daily and amlodipine 2.5 mg daily. -Initially held to allow for permissive hypertension.  Resume when medically appropriate.  Hypothyroidism Last TSH 3.39 on 06/03/2021. -Continue current dose of losartan  Chronic back pain -Continue Tylenol as needed  DVT prophylaxis: lovenox Advance Care Planning:   Code Status: DNR Consults: Neurology  Family Communication: None  Severity of Illness: The appropriate patient status for this patient is OBSERVATION. Observation status is  judged to be reasonable and necessary in order to provide the required intensity of service to ensure the patient's safety. The patient's presenting symptoms, physical exam findings, and initial radiographic and laboratory data in the context of their medical condition is felt to place them at decreased risk for further clinical deterioration. Furthermore, it is anticipated that the patient will be medically stable for discharge from the hospital within 2 midnights of admission.   Author: Norval Morton, MD 07/13/2021 3:32 PM  For on call review [email or web address].

## 2021-07-14 ENCOUNTER — Observation Stay (HOSPITAL_COMMUNITY): Payer: Medicare Other

## 2021-07-14 ENCOUNTER — Observation Stay (HOSPITAL_BASED_OUTPATIENT_CLINIC_OR_DEPARTMENT_OTHER): Payer: Medicare Other

## 2021-07-14 DIAGNOSIS — E875 Hyperkalemia: Secondary | ICD-10-CM | POA: Diagnosis not present

## 2021-07-14 DIAGNOSIS — I10 Essential (primary) hypertension: Secondary | ICD-10-CM

## 2021-07-14 DIAGNOSIS — G459 Transient cerebral ischemic attack, unspecified: Secondary | ICD-10-CM | POA: Diagnosis not present

## 2021-07-14 LAB — LIPID PANEL
Cholesterol: 156 mg/dL (ref 0–200)
HDL: 62 mg/dL (ref >40)
LDL Cholesterol: 69 mg/dL (ref 0–99)
Total CHOL/HDL Ratio: 2.5 RATIO
Triglycerides: 123 mg/dL (ref <150)
VLDL: 25 mg/dL (ref 0–40)

## 2021-07-14 LAB — HEMOGLOBIN A1C
Hgb A1c MFr Bld: 5.4 % (ref 4.8–5.6)
Mean Plasma Glucose: 108.28 mg/dL

## 2021-07-14 MED ORDER — METOPROLOL SUCCINATE ER 25 MG PO TB24
25.0000 mg | ORAL_TABLET | Freq: Every day | ORAL | Status: DC
  Administered 2021-07-14 – 2021-07-15 (×2): 25 mg via ORAL
  Filled 2021-07-14 (×2): qty 1

## 2021-07-14 MED ORDER — SODIUM ZIRCONIUM CYCLOSILICATE 5 G PO PACK
5.0000 g | PACK | Freq: Three times a day (TID) | ORAL | Status: AC
Start: 2021-07-14 — End: 2021-07-14
  Administered 2021-07-14 (×3): 5 g via ORAL
  Filled 2021-07-14 (×3): qty 1

## 2021-07-14 MED ORDER — CLOPIDOGREL BISULFATE 75 MG PO TABS
75.0000 mg | ORAL_TABLET | Freq: Every day | ORAL | Status: DC
  Administered 2021-07-15: 75 mg via ORAL
  Filled 2021-07-14: qty 1

## 2021-07-14 MED ORDER — AMLODIPINE BESYLATE 2.5 MG PO TABS
2.5000 mg | ORAL_TABLET | Freq: Every day | ORAL | Status: DC
  Administered 2021-07-14 – 2021-07-15 (×2): 2.5 mg via ORAL
  Filled 2021-07-14 (×2): qty 1

## 2021-07-14 MED ORDER — HYDRALAZINE HCL 20 MG/ML IJ SOLN
10.0000 mg | Freq: Four times a day (QID) | INTRAMUSCULAR | Status: DC | PRN
Start: 2021-07-14 — End: 2021-07-15
  Administered 2021-07-14: 10 mg via INTRAVENOUS
  Filled 2021-07-14: qty 1

## 2021-07-14 NOTE — Evaluation (Signed)
Physical Therapy Evaluation Patient Details Name: Gina Terry MRN: [id number] DOB: 1921-10-08 Today's Date: 07/14/2021  History of Present Illness  Pt is a 89 yo female s/p weakness, chills. CT head: Possible age-indeterminate  cortical/subcortical infarcts in the frontoparietal lobes bilaterally PMHx: HTN. osteoporosis, Afib.  Clinical Impression  Pt doing well with mobility and no further PT needed.  Ready for dc from PT standpoint.        Recommendations for follow up therapy are one component of a multi-disciplinary discharge planning process, led by the attending physician.  Recommendations may be updated based on patient status, additional functional criteria and insurance authorization.  Follow Up Recommendations No PT follow up    Assistance Recommended at Discharge Intermittent Supervision/Assistance  Patient can return home with the following  Help with stairs or ramp for entrance    Equipment Recommendations None recommended by PT  Recommendations for Other Services       Functional Status Assessment Patient has not had a recent decline in their functional status     Precautions / Restrictions Precautions Precautions: Fall Restrictions Weight Bearing Restrictions: No      Mobility  Bed Mobility Overal bed mobility: Modified Independent Bed Mobility: Supine to Sit, Sit to Supine     Supine to sit: Modified independent (Device/Increase time) Sit to supine: Modified independent (Device/Increase time)        Transfers Overall transfer level: Needs assistance Equipment used: Rolling walker (2 wheels) Transfers: Sit to/from Stand Sit to Stand: Supervision           General transfer comment: supervision for safety    Ambulation/Gait Ambulation/Gait assistance: Supervision Gait Distance (Feet): 90 Feet Assistive device: Rolling walker (2 wheels) Gait Pattern/deviations: Step-through pattern, Decreased stride length, Trunk flexed Gait velocity:  decr Gait velocity interpretation: 1.31 - 2.62 ft/sec, indicative of limited community ambulator   General Gait Details: Assist for safety  Stairs            Wheelchair Mobility    Modified Rankin (Stroke Patients Only)       Balance Overall balance assessment: Mild deficits observed, not formally tested                                           Pertinent Vitals/Pain Pain Assessment Pain Assessment: No/denies pain    Home Living Family/patient expects to be discharged to:: Private residence Living Arrangements: Non-relatives/Friends (24/7 caregivers) Available Help at Discharge: Personal care attendant;Available 24 hours/day Type of Home: House Home Access: Stairs to enter Entrance Stairs-Rails: Can reach both Entrance Stairs-Number of Steps: 1   Home Layout: One level Home Equipment: Conservation officer, nature (2 wheels);Shower seat;BSC/3in1;Hand held shower head      Prior Function Prior Level of Function : Needs assist             Mobility Comments: uses RW for  mobility ADLs Comments: mostly supervisionA for set-upA for ADL; iADLs performed for her     Hand Dominance   Dominant Hand: Right    Extremity/Trunk Assessment   Upper Extremity Assessment Upper Extremity Assessment: Overall WFL for tasks assessed    Lower Extremity Assessment Lower Extremity Assessment: Overall WFL for tasks assessed    Cervical / Trunk Assessment Cervical / Trunk Assessment: Kyphotic  Communication   Communication: HOH  Cognition Arousal/Alertness: Awake/alert Behavior During Therapy: WFL for tasks assessed/performed Overall Cognitive Status: Within Functional Limits for  tasks assessed                                          General Comments      Exercises     Assessment/Plan    PT Assessment Patient does not need any further PT services  PT Problem List         PT Treatment Interventions      PT Goals (Current goals can  be found in the Care Plan section)  Acute Rehab PT Goals PT Goal Formulation: All assessment and education complete, DC therapy    Frequency       Co-evaluation               AM-PAC PT "6 Clicks" Mobility  Outcome Measure Help needed turning from your back to your side while in a flat bed without using bedrails?: None Help needed moving from lying on your back to sitting on the side of a flat bed without using bedrails?: None Help needed moving to and from a bed to a chair (including a wheelchair)?: A Little Help needed standing up from a chair using your arms (e.g., wheelchair or bedside chair)?: A Little Help needed to walk in hospital room?: A Little Help needed climbing 3-5 steps with a railing? : A Little 6 Click Score: 20    End of Session   Activity Tolerance: Patient tolerated treatment well Patient left: in bed;with call bell/phone within reach;with nursing/sitter in room;with family/visitor present   PT Visit Diagnosis: Other abnormalities of gait and mobility (R26.89)    Time: 3329-5188 PT Time Calculation (min) (ACUTE ONLY): 10 min   Charges:   PT Evaluation $PT Eval Low Complexity: 1 Low          [place] Office [place] 07/14/2021, 3:46 PM

## 2021-07-14 NOTE — Progress Notes (Signed)
  Progress Note   Patient: Gina Terry OTR:[id number] DOB: 1921-08-23 DOA: 07/13/2021     0 DOS: the patient was seen and examined on 07/14/2021   Brief hospital course: Gina Terry is an 88 y.o. female with a PMHx of PAF, LBBB, HTN, hypothyroidism and osteoporosis who presented to the ED on Wednesday afternoon after an episode of acute confusion and word-finding difficulty. The patient was claustrophobic and declined MRI. CT scan of the brain concerning for possible age-indeterminate cortical/subcortical infarcts in the frontal parietal lobes.    Assessment and Plan: TIA: Confusion and word finding difficulty now resolved. LDL at goal. A1C unremarkable.  Hyperkalemia: Mild. Lokelma started. Repeat BMP in AM.   PAF: Admitting provider discussed with patients primary cardiologist. Recommends event monitor. Continue home Toprol XL  Hypertension: Resume home Toprol XL and Norvasc  Subjective: Caretaker present at bedside. Symptoms resolved. No acute complaints.  Physical Exam: Vitals:   07/14/21 0906 07/14/21 1239 07/14/21 1539 07/14/21 1700  BP: (!) 165/70 (!) 172/72 (!) 180/76 140/72  Pulse: (!) 56 (!) 56 60 75  Resp: '16 16 16   '$ Temp:  97.9 F (36.6 C) 97.8 F (36.6 C)   TempSrc:  Oral Oral   SpO2: 96% 96% 97% 96%  Weight:      Height:         Constitutional: Elderly female currently in no acute distress Eyes: PERRL, lids and conjunctivae normal ENMT: Mucous membranes are moist. Posterior pharynx clear of any exudate or lesions. Hard of hearing Neck: normal, supple, no masses,   Respiratory: clear to auscultation bilaterally, no wheezing, no crackles. Normal respiratory effort. No accessory muscle use.  Cardiovascular: Regular rate and rhythm, no murmurs / rubs / gallops. No extremity edema.   Abdomen: No abdominal tenderness. Musculoskeletal: no clubbing / cyanosis.  Thoracic kyphosis present.  Some tenderness palpation underneath the right posterior back. Skin: no rashes,  lesions, ulcers. No induration Neurologic: CN 2-12 grossly intact. Sensation intact, DTR normal. Strength 5/5 in all 4.  Psychiatric: Normal judgment and insight. Alert and oriented x 3. Normal mood.   Data Reviewed: Labs, imaging, vitals  Family Communication: No family present at bedside  Disposition: Status is: Observation The patient remains OBS appropriate and will d/c before 2 midnights.  Planned Discharge Destination: Home  Time spent: 55 mins Author: Leslee Home, DO 07/14/2021 5:56 PM  For on call review [email or web address].

## 2021-07-14 NOTE — Progress Notes (Signed)
Echo attempted at 2:30, MD with patient and family. Will re-attempt as schedule permits.  [place]

## 2021-07-14 NOTE — TOC Initial Note (Signed)
Transition of Care [facility]) - Initial/Assessment Note    Patient Details  Name: Gina Terry MRN: [id number] Date of Birth: August 20, 1921  [facility] CM/SW Contact:    Pollie Friar, RN Phone Number: 07/14/2021, 3:28 PM  Clinical Narrative:                 Patient is from home alone but has 24 hour caregivers through [employer].  Pt manages her own medications but the caregivers oversee that she takes them properly.  [facility] takes her to her appointments.  Awaiting therapy eval to determine any d/c needs.  TOC following.  Expected Discharge Plan: Home/Self Care Barriers to Discharge: Continued Medical Work up   Patient Goals and CMS Choice        Expected Discharge Plan and Services Expected Discharge Plan: Home/Self Care   Discharge Planning Services: CM Consult   Living arrangements for the past 2 months: Single Family Home                                      Prior Living Arrangements/Services Living arrangements for the past 2 months: Single Family Home Lives with:: Self Patient language and need for interpreter reviewed:: Yes Do you feel safe going back to the place where you live?: Yes      Need for Family Participation in Patient Care: Yes (Comment) Care giver support system in place?: Yes (comment) Current home services: DME, Homehealth aide (cane/ walker/ shower seat) Criminal Activity/Legal Involvement Pertinent to Current Situation/Hospitalization: No - Comment as needed  Activities of Daily Living      Permission Sought/Granted                  Emotional Assessment Appearance:: Appears stated age Attitude/Demeanor/Rapport: Engaged Affect (typically observed): Accepting Orientation: : Oriented to Self, Oriented to Place, Oriented to  Time, Oriented to Situation   Psych Involvement: No (comment)  Admission diagnosis:  TIA (transient ischemic attack) [G45.9] Patient Active Problem List   Diagnosis Date Noted   TIA  (transient ischemic attack) 07/13/2021   Hyperkalemia 07/13/2021   Insomnia associated with menopause 02/23/2021   Gastroenteritis 02/04/2019   Colitis 01/25/2019   Weight loss, unintentional 09/23/2018   Chronic bilateral low back pain 04/19/2016   Gastroesophageal reflux 04/19/2016   Hypertension, essential 04/19/2016   Osteoporosis, 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/19/2016   Primary hypothyroidism 04/19/2016   Paroxysmal atrial fibrillation ([place])    Left bundle branch block    OTHER CONSTIPATION 07/22/2007   ABDOMINAL PAIN, EPIGASTRIC 07/22/2007   GASTRITIS, CHRONIC 05/14/2007   DIVERTICULOSIS, COLON 05/14/2007   PCP:  Wardell Honour, MD Pharmacy:   [id number] [id number]: [phone]: [phone]    Social Determinants of Health (SDOH) Interventions    Readmission Risk Interventions     No data to display

## 2021-07-14 NOTE — Progress Notes (Addendum)
STROKE TEAM PROGRESS NOTE   ATTENDING NOTE: I reviewed above note and agree with the assessment and plan. Pt was seen and examined.   88 year old female with history of PAF on aspirin 81, hypertension, LBBB, hypothyroidism admitted for episode of confusion, word finding difficulties.  Symptoms resolved on ED arrival.  CT no acute abnormality.  Patient refused MRI or MRA due to claustrophobia.  Carotid Doppler negative.  2D echo pending.  LDL 69, A1c 5.4.  Creatinine 0.87.  On exam, patient caregiver at bedside.  Patient sitting in bed for dinner.  Awake alert, orientated x3.  No aphasia, follows simple commands, neurologically intact without focal deficit.  Patient symptoms concerning for TIA, etiology likely due to A-fib not on [facility].  Currently patient on aspirin 81 and denies any GI bleeding or head trauma or frequent falls at home.  She does not know why she was not on anticoagulation.  Per note, Dr. Rowe Pavy discussed with patient cardiology Dr. [place] recommended cardiac event monitoring as patient has no A-fib recurrence since 2016.   Given current TIA, recommend aspirin 81 and Plavix 75 DAPT for 3 weeks and then Plavix alone.  No statin needed at this time given advanced age and LDL which is at goal.  PT/OT no recommendation.  For detailed assessment and plan, please refer to above/below as I have made changes wherever appropriate.   Neurology will sign off. Please call with questions. Pt will follow up with stroke clinic NP at [facility] in about 4 weeks. Thanks for the consult.   Rosalin Hawking, MD PhD Stroke Neurology 07/14/2021 9:32 PM    INTERVAL HISTORY Patient states doing better, she is c/o low back pain. She states at this feels to be at her baseline. Her care giver is at the bed side and agrees. No c/o headaches, speech difficulty, visual changes, paresthesia or weakness  Vitals:   07/14/21 1539 07/14/21 1700 07/14/21 1933 07/14/21 1936  BP: (!) 180/76 140/72 (!) 149/66 (!) 149/66   Pulse: 60 75 60 63  Resp: 16  18   Temp: 97.8 F (36.6 C)  97.8 F (36.6 C) 97.8 F (36.6 C)  TempSrc: Oral  Oral Oral  SpO2: 97% 96%  96%  Weight:      Height:       CBC:  Recent Labs  Lab 07/13/21 1315  WBC 9.5  NEUTROABS 4.9  HGB 15.0  HCT 44.4  MCV 92.3  PLT 010   Basic Metabolic Panel:  Recent Labs  Lab 07/13/21 1315  NA 131*  K 5.2*  CL 100  CO2 22  GLUCOSE 82  BUN 16  CREATININE 0.87  CALCIUM 9.0   Lipid Panel:  Recent Labs  Lab 07/13/21 1315  CHOL 156  TRIG 123  HDL 62  CHOLHDL 2.5  VLDL 25  LDLCALC 69   HgbA1c:  Recent Labs  Lab 07/13/21 1315  HGBA1C 5.4   Urine Drug Screen: No results for input(s): "LABOPIA", "COCAINSCRNUR", "LABBENZ", "AMPHETMU", "THCU", "LABBARB" in the last 168 hours.  Alcohol Level No results for input(s): "ETH" in the last 168 hours.  IMAGING past 24 hours VAS US CAROTID  Result Date: 07/14/2021 Carotid Arterial Duplex Study Patient Name:  Gina Terry  Date of Exam:   07/14/2021 Medical Rec #: [id number]      Accession #:    [id number] Date of Birth: April 05, 1921      Patient Gender: F Patient Age:   78 years Exam Location:  [facility] Procedure:  VAS US CAROTID Referring Phys: Cornelius Moras Rachell Druckenmiller --------------------------------------------------------------------------------  Indications:       TIA. Risk Factors:      Hypertension, past history of smoking. Comparison Study:  No previous exam noted. Performing Technologist: Bobetta Lime BS, RVT  Examination Guidelines: A complete evaluation includes B-mode imaging, spectral Doppler, color Doppler, and power Doppler as needed of all accessible portions of each vessel. Bilateral testing is considered an integral part of a complete examination. Limited examinations for reoccurring indications may be performed as noted.  Right Carotid Findings: +----------+--------+--------+--------+------------------+--------+           PSV cm/sEDV cm/sStenosisPlaque  DescriptionComments +----------+--------+--------+--------+------------------+--------+ CCA Prox  37      9                                          +----------+--------+--------+--------+------------------+--------+ CCA Distal31      8                                          +----------+--------+--------+--------+------------------+--------+ ICA Prox  29      9               heterogenous               +----------+--------+--------+--------+------------------+--------+ ICA Distal46      10                                         +----------+--------+--------+--------+------------------+--------+ ECA       51                      calcific                   +----------+--------+--------+--------+------------------+--------+ +----------+--------+-------+----------------+-------------------+           PSV cm/sEDV cmsDescribe        Arm Pressure (mmHG) +----------+--------+-------+----------------+-------------------+ [id number]             Multiphasic, WNL                    +----------+--------+-------+----------------+-------------------+ +---------+--------+---+--------+---------+ VertebralPSV cm/s379EDV cm/sAntegrade +---------+--------+---+--------+---------+  Left Carotid Findings: +----------+-------+--------+--------+-----------------------+-----------------+           PSV    EDV cm/sStenosisPlaque Description     Comments                    cm/s                                                            +----------+-------+--------+--------+-----------------------+-----------------+ CCA Prox  48     8                                                        +----------+-------+--------+--------+-----------------------+-----------------+ CCA Distal38     8  intimal                                                                   thickening         +----------+-------+--------+--------+-----------------------+-----------------+ ICA Prox  35     9               heterogenous and                                                          calcific                                 +----------+-------+--------+--------+-----------------------+-----------------+ ICA Distal57     14                                                       +----------+-------+--------+--------+-----------------------+-----------------+ ECA       41     0               heterogenous                             +----------+-------+--------+--------+-----------------------+-----------------+ +----------+--------+--------+----------------+-------------------+           PSV cm/sEDV cm/sDescribe        Arm Pressure (mmHG) +----------+--------+--------+----------------+-------------------+ [id number]              Multiphasic, WNL                    +----------+--------+--------+----------------+-------------------+ +---------+--------+--+--------+-+---------+ VertebralPSV cm/s33EDV cm/s9Antegrade +---------+--------+--+--------+-+---------+   Summary: Right Carotid: Velocities in the right ICA are consistent with a 1-39% stenosis. Left Carotid: Velocities in the left ICA are consistent with a 1-39% stenosis. Vertebrals:  Bilateral vertebral arteries demonstrate antegrade flow. Subclavians: Normal flow hemodynamics were seen in bilateral subclavian              arteries. *See table(s) above for measurements and observations.     Preliminary     P Neurological Examination Mental Status: Awake and alert. Oriented x 5. Pleasant and cooperative. Speech is fluent without dysarthria. Able to express ideas clearly. Naming intact. Repetition intact. Comprehension intact.  Cranial Nerves: II: Temporal visual fields intact with no extinction to DSS. PERRL.   III,IV, VI: EOMI with smooth visual pursuits. No nystagmus. No ptosis.  V: Temp sensation equal  bilaterally.  VII: Smile symmetric VIII: Hearing intact to voice with hearing aids off (elects not to place her hearing aids back on when asked).  IX,X: No hypophonia XI: Symmetric XII: Midline tongue extension Motor: Decreased muscle bulk x 4.  Tone normal x 4.  4+/5 strength proximally and distally in upper and lower extremities without asymmetry.  Sensory: Temp and light touch intact throughout, bilaterally. No extinction to DSS.  Deep Tendon Reflexes: 0 bilateral achilles. Otherwise 1+ and symmetric  throughout. Plantars equivocal bilaterally.  Cerebellar: No ataxia with FNF bilaterally  Gait: Deferred  ASSESSMENT/PLAN Ms. TAMLYN SIDES is a 88 y.o. female with history of PAF not on anticoagulant , HTN, hypothyrodism  presenting with transient dysphasia.Domingo Mend:   Patients exam is non focal and has a normal speech She is at increased risk for strokes 2/2 her PAF. She is also at higher risk for falls and has had balnce issues. She needs 24 hour care taker due to her ambulation. Patient states decision was made in the past not to have her on anticoagulants due to risk of fall and bleeding. CT Head shows no acute abnormality with possible age indeterminate cortical/subcortical infarct of fronto-parietal lobes bilateral, Patient refuses to follow up with any further imaging (MRI/MRA) She did agree to 2D Echocardiogram and carotid doppler (Orde p.  Continue ASA 81 mg po qday. She may need addition of Plavix , if she agrees. Once Echo/Carotid studies are completed , we may be able to discuss the addition of Plavix therapy Disposition:  pending  Hypertension Home meds:  Norvasc/ Toprol/ Stable Long-term BP goal normotensive  Hyperlipidemia Home meds:  None LDL 69, goal < 70      Recent Labs    07/13/21 1323  GLUCAP 87    SSI  Other Stroke Risk Factors Advance Age PAF HTN  Hospital day # 0    To contact Stroke Continuity provider, please refer to  [email or web address] After hours, contact General Neurology

## 2021-07-14 NOTE — ED Notes (Signed)
Tania Ade daughter [phone] requesting an update on the patient

## 2021-07-14 NOTE — Evaluation (Signed)
Occupational Therapy Evaluation Patient Details Name: Gina Terry MRN: [id number] DOB: 12-08-1921 Today's Date: 07/14/2021   History of Present Illness Pt is a 88 yo female s/p weakness, chills. CT head: Possible age-indeterminate  cortical/subcortical infarcts in the frontoparietal lobes bilaterally PMHx: HTN. osteoporosis, Afib.   Clinical Impression   Pt PTA: Pt living with caregivers 24/7 who provide mostly assistance with transfers and mobility with RW. Pt can perform own ADL with set-upA and iADLs are performed for her. Pt currently, at baseline for mobility with RW, some instability with standing at sink (first time up) and performing toileting, dressing and grooming tasks at sink with supervisionA. Cognition appears intact, A/O x4, 100% with 3/3 object recognition/recall. Pt does not require continued OT skilled services. OT signing off.     Recommendations for follow up therapy are one component of a multi-disciplinary discharge planning process, led by the attending physician.  Recommendations may be updated based on patient status, additional functional criteria and insurance authorization.   Follow Up Recommendations  No OT follow up    Assistance Recommended at Discharge Set up Supervision/Assistance  Patient can return home with the following A little help with walking and/or transfers;Assistance with cooking/housework;Direct supervision/assist for medications management;Assist for transportation;Help with stairs or ramp for entrance    Functional Status Assessment  Patient has had a recent decline in their functional status and demonstrates the ability to make significant improvements in function in a reasonable and predictable amount of time.  Equipment Recommendations  None recommended by OT    Recommendations for Other Services       Precautions / Restrictions Precautions Precautions: Fall Restrictions Weight Bearing Restrictions: No      Mobility Bed  Mobility Overal bed mobility: Needs Assistance Bed Mobility: Supine to Sit, Sit to Supine     Supine to sit: Supervision Sit to supine: Min guard   General bed mobility comments: manage telemetry    Transfers Overall transfer level: Needs assistance Equipment used: Rolling walker (2 wheels) Transfers: Sit to/from Stand Sit to Stand: Min guard           General transfer comment: RW for stability      Balance Overall balance assessment: Mild deficits observed, not formally tested                                         ADL either performed or assessed with clinical judgement   ADL Overall ADL's : At baseline                                       General ADL Comments: Supevision A to minguardA as needed for standing tasks at sink; pt performing anterior pericare on commode and fixing socks at EOB with figure 4 technique.     Vision Baseline Vision/History: 1 Wears glasses Ability to See in Adequate Light: 0 Adequate Patient Visual Report: No change from baseline Vision Assessment?: Yes Eye Alignment: Within Functional Limits Ocular Range of Motion: Within Functional Limits Alignment/Gaze Preference: Within Defined Limits     Perception     Praxis      Pertinent Vitals/Pain Pain Assessment Pain Assessment: 0-10 Pain Score: 4  Pain Location: back with transitions Pain Descriptors / Indicators: Discomfort Pain Intervention(s): Monitored during session     Hand Dominance Right  Extremity/Trunk Assessment Upper Extremity Assessment Upper Extremity Assessment: Overall WFL for tasks assessed   Lower Extremity Assessment Lower Extremity Assessment: Overall WFL for tasks assessed;Defer to PT evaluation   Cervical / Trunk Assessment Cervical / Trunk Assessment: Kyphotic   Communication Communication Communication: HOH   Cognition Arousal/Alertness: Awake/alert Behavior During Therapy: WFL for tasks  assessed/performed Overall Cognitive Status: Within Functional Limits for tasks assessed                                       General Comments  Pt's PCA, Miya in room (spends 8 hrs/day with her)    Exercises     Shoulder Instructions      Home Living Family/patient expects to be discharged to:: Private residence Living Arrangements: Non-relatives/Friends (24/7 caregivers) Available Help at Discharge: Personal care attendant;Available 24 hours/day Type of Home: House Home Access: Stairs to enter [employer] of Steps: 1 Entrance Stairs-Rails: Can reach both Home Layout: One level     Bathroom Shower/Tub: Occupational psychologist: Standard     Home Equipment: Conservation officer, nature (2 wheels);Shower seat;BSC/3in1;Hand held shower head          Prior Functioning/Environment Prior Level of Function : Needs assist             Mobility Comments: uses RW for  mobility ADLs Comments: mostly supervisionA for set-upA for ADL; iADLs performed for her        OT Problem List: Decreased activity tolerance      OT Treatment/Interventions:      OT Goals(Current goals can be found in the care plan section) Acute Rehab OT Goals Patient Stated Goal: to go home OT Goal Formulation: All assessment and education complete, DC therapy Potential to Achieve Goals: Good  OT Frequency:      Co-evaluation              AM-PAC OT "6 Clicks" Daily Activity     Outcome Measure Help from another person eating meals?: None Help from another person taking care of personal grooming?: A Little Help from another person toileting, which includes using toliet, bedpan, or urinal?: A Little Help from another person bathing (including washing, rinsing, drying)?: A Little Help from another person to put on and taking off regular upper body clothing?: A Little Help from another person to put on and taking off regular lower body clothing?: A Little 6 Click Score:  19   End of Session Equipment Utilized During Treatment: Rolling walker (2 wheels) Nurse Communication: Mobility status  Activity Tolerance: Patient tolerated treatment well Patient left: in bed;with call bell/phone within reach;with bed alarm set;with family/visitor present  OT Visit Diagnosis: Unsteadiness on feet (R26.81)                Time: 2233-6122 OT Time Calculation (min): 27 min Charges:  OT General Charges $OT Visit: 1 Visit OT Evaluation $OT Eval Moderate Complexity: 1 Mod OT Treatments $Self Care/Home Management : 8-22 mins  Jefferey Pica, OTR/L Acute Rehabilitation Services Office: [place] 07/14/2021, 10:32 AM

## 2021-07-14 NOTE — Progress Notes (Signed)
Bilateral carotid duplex study completed. Please see CV Proc for preliminary results.  Pleas Carneal BS, RVT 07/14/2021 12:14 PM

## 2021-07-14 NOTE — Care Management Obs Status (Signed)
MEDICARE OBSERVATION STATUS NOTIFICATION   Patient Details  Name: Gina Terry MRN: [id number] Date of Birth: 1921/10/11   Medicare Observation Status Notification Given:  Yes    Pollie Friar, RN 07/14/2021, 3:33 PM

## 2021-07-15 ENCOUNTER — Observation Stay (HOSPITAL_BASED_OUTPATIENT_CLINIC_OR_DEPARTMENT_OTHER): Payer: Medicare Other

## 2021-07-15 ENCOUNTER — Other Ambulatory Visit: Payer: Self-pay | Admitting: Physician Assistant

## 2021-07-15 DIAGNOSIS — G459 Transient cerebral ischemic attack, unspecified: Secondary | ICD-10-CM

## 2021-07-15 DIAGNOSIS — I480 Paroxysmal atrial fibrillation: Secondary | ICD-10-CM

## 2021-07-15 LAB — ECHOCARDIOGRAM COMPLETE
AR max vel: 2.17 cm2
AV Area VTI: 2.02 cm2
AV Area mean vel: 2.02 cm2
AV Mean grad: 10 mmHg
AV Peak grad: 17.4 mmHg
Ao pk vel: 2.09 m/s
Area-P 1/2: 2.62 cm2
Calc EF: 76.8 %
Height: 64 in
MV VTI: 2.24 cm2
S' Lateral: 1.55 cm
Single Plane A2C EF: 82.1 %
Single Plane A4C EF: 69.4 %
Weight: 1200 oz

## 2021-07-15 LAB — BASIC METABOLIC PANEL
Anion gap: 9 (ref 5–15)
BUN: 12 mg/dL (ref 8–23)
CO2: 21 mmol/L — ABNORMAL LOW (ref 22–32)
Calcium: 8.8 mg/dL — ABNORMAL LOW (ref 8.9–10.3)
Chloride: 104 mmol/L (ref 98–111)
Creatinine, Ser: 0.79 mg/dL (ref 0.44–1.00)
GFR, Estimated: >60 mL/min (ref >60)
Glucose, Bld: 95 mg/dL (ref 70–99)
Potassium: 3.5 mmol/L (ref 3.5–5.1)
Sodium: 134 mmol/L — ABNORMAL LOW (ref 135–145)

## 2021-07-15 MED ORDER — CLOPIDOGREL BISULFATE 75 MG PO TABS: 75.0000 mg | ORAL_TABLET | Freq: Every day | ORAL | 0 refills | Status: DC

## 2021-07-15 MED ORDER — ASPIRIN 81 MG PO TBEC: 81.0000 mg | DELAYED_RELEASE_TABLET | Freq: Every day | ORAL | 0 refills | Status: DC

## 2021-07-15 NOTE — Discharge Summary (Signed)
Physician Discharge Summary   Patient: Gina Terry MRN: [id number] DOB: 04-29-1921  Admit date:     07/13/2021  Discharge date: 07/15/21  Discharge Physician: Leslee Home   PCP: Wardell Honour, MD   Recommendations at discharge:  1) Follow up with PCP 2) Follow up with neurology 3) Take DAPT as prescribed 4) 30 day event monitor  Discharge Diagnoses: TIA Hyperkalemia resolved PAF, no reoccurrence since 2016, not chronic anticoagulation, on aspirin 81 Hypertension Hypothyroidism Osteoporosis LBBB  Hospital Course: Gina Terry is an 88 y.o. female with a PMHx of PAF on ASA 72, LBBB, HTN, hypothyroidism and osteoporosis who presented to the ED on Wednesday afternoon after an episode of acute confusion and word-finding difficulty. The patient was claustrophobic and declined MRI.  Symptoms had resolved upon arrival to the ER.  CT scan of the brain concerning for possible age-indeterminate cortical/subcortical infarcts in the frontal parietal lobes.  Carotid Doppler negative.  Unremarkable.  LDL 69 and therefore not statin therapy.  Gust with her primary cardiologist and she will have cardiac event monitoring as the patient has no A-fib recurrence since 2016.  Neurology followed along.  We will be on dual antiplatelet for 3 weeks with aspirin and Plavix and then on Plavix monotherapy after that.  Follow-up with neurology at stroke clinic.  Cardiology will arrange event monitor upon discharge.  PT OT no recommendations.  Daughter updated today about plan of care.  All questions answered     Consultants: Neurology Procedures performed: None Disposition: Home Diet recommendation: Heart healthy Discharge Diet Orders (From admission, onward)     Start     Ordered   07/15/21 0000  Diet - low sodium heart healthy        07/15/21 1056           Cardiac diet DISCHARGE MEDICATION: Allergies as of 07/15/2021       Reactions   Rofecoxib    Other reaction(s): Other (See  Comments) reflux, edema    Sulfamethoxazole-trimethoprim Nausea Only   Codeine Other (See Comments)   unknown Other reaction(s): GI Upset (intolerance)   Estradiol Rash   Local rash   Flurbiprofen    Other reaction(s): GI Upset (intolerance)   Ibuprofen    Other reaction(s): GI Upset (intolerance)        Medication List     STOP taking these medications    aspirin 81 MG tablet Replaced by: aspirin EC 81 MG tablet       TAKE these medications    acetaminophen 500 MG tablet Commonly known as: TYLENOL Take 500 mg by mouth every 6 (six) hours as needed for mild pain.   amLODipine 2.5 MG tablet Commonly known as: NORVASC Take 1 tablet (2.5 mg total) by mouth daily.   aspirin EC 81 MG tablet Take 1 tablet (81 mg total) by mouth daily for 21 days. Swallow whole. Start taking on: July 16, 2021 Replaces: aspirin 81 MG tablet   calcium carbonate 500 MG chewable tablet Commonly known as: TUMS - dosed in mg elemental calcium Chew 3 tablets by mouth daily as needed for indigestion or heartburn.   clopidogrel 75 MG tablet Commonly known as: PLAVIX Take 1 tablet (75 mg total) by mouth daily. Start taking on: July 16, 2021   levothyroxine 50 MCG tablet Commonly known as: SYNTHROID Take 1 tablet (50 mcg total) by mouth daily before breakfast.   loperamide 2 MG capsule Commonly known as: Imodium A-D Take 1 capsule (2 mg  total) by mouth as needed for diarrhea or loose stools.   metoprolol succinate 25 MG 24 hr tablet Commonly known as: TOPROL-XL TAKE 1 TABLET BY MOUTH EVERY DAY   nitroGLYCERIN 0.4 MG SL tablet Commonly known as: NITROSTAT Place 1 tablet (0.4 mg total) under the tongue every 5 (five) minutes as needed for chest pain.   polyethylene glycol 17 g packet Commonly known as: MIRALAX / GLYCOLAX Take 17 g by mouth daily as needed for mild constipation or moderate constipation.   zolpidem 5 MG tablet Commonly known as: AMBIEN TAKE 1 TABLET BY MOUTH  EVERYDAY AT BEDTIME What changed: See the new instructions.        Follow-up Information     [facility] Follow up.   Specialty: Neurology Contact information: [address] [place]        Wardell Honour, MD. Schedule an appointment as soon as possible for a visit in 1 week(s).   Specialties: Family Medicine, Emergency Medicine Contact information: [place] [address] [phone]         Martinique, Peter M, MD. Call in 3 day(s).   Specialty: Cardiology Why: heart monitor Contact information: [place] [address] [phone]                Discharge Exam: Filed Weights   07/13/21 1308  Weight: 34 kg   Constitutional: Elderly female currently in no acute distress Eyes: PERRL, lids and conjunctivae normal ENMT: Mucous membranes are moist. Posterior pharynx clear of any exudate or lesions. Hard of hearing Neck: normal, supple, no masses,   Respiratory: clear to auscultation bilaterally, no wheezing, no crackles. Normal respiratory effort. No accessory muscle use.  Cardiovascular: Regular rate and rhythm, no murmurs / rubs / gallops. No extremity edema.   Abdomen: No abdominal tenderness. Musculoskeletal: no clubbing / cyanosis.  Thoracic kyphosis present.  Some tenderness palpation underneath the right posterior back. Skin: no rashes, lesions, ulcers. No induration Neurologic: CN 2-12 grossly intact. Sensation intact, DTR normal. Strength 5/5 in all 4.  Psychiatric: Normal judgment and insight. Alert and oriented x 3. Normal mood.   Condition at discharge: good  The results of significant diagnostics from this hospitalization (including imaging, microbiology, ancillary and laboratory) are listed below for reference.   Imaging Studies: VAS US CAROTID  Result Date: 07/15/2021 Carotid Arterial Duplex Study Patient Name:  Gina Terry  Date of  Exam:   07/14/2021 Medical Rec #: [id number]      Accession #:    [id number] Date of Birth: Apr 02, 1921      Patient Gender: F Patient Age:   71 years Exam Location:  [facility] Procedure:      VAS US CAROTID Referring Phys: Cornelius Moras XU --------------------------------------------------------------------------------  Indications:       TIA. Risk Factors:      Hypertension, past history of smoking. Comparison Study:  No previous exam noted. Performing Technologist: Bobetta Lime BS, RVT  Examination Guidelines: A complete evaluation includes B-mode imaging, spectral Doppler, color Doppler, and power Doppler as needed of all accessible portions of each vessel. Bilateral testing is considered an integral part of a complete examination. Limited examinations for reoccurring indications may be performed as noted.  Right Carotid Findings: +----------+--------+--------+--------+------------------+--------+           PSV cm/sEDV cm/sStenosisPlaque DescriptionComments +----------+--------+--------+--------+------------------+--------+ CCA Prox  37      9                                          +----------+--------+--------+--------+------------------+--------+  CCA Distal31      8                                          +----------+--------+--------+--------+------------------+--------+ ICA Prox  29      9               heterogenous               +----------+--------+--------+--------+------------------+--------+ ICA Distal46      10                                         +----------+--------+--------+--------+------------------+--------+ ECA       51                      calcific                   +----------+--------+--------+--------+------------------+--------+ +----------+--------+-------+----------------+-------------------+           PSV cm/sEDV cmsDescribe        Arm Pressure (mmHG) +----------+--------+-------+----------------+-------------------+ [id number]              Multiphasic, WNL                    +----------+--------+-------+----------------+-------------------+ +---------+--------+---+--------+---------+ VertebralPSV cm/s379EDV cm/sAntegrade +---------+--------+---+--------+---------+  Left Carotid Findings: +----------+-------+--------+--------+-----------------------+-----------------+           PSV    EDV cm/sStenosisPlaque Description     Comments                    cm/s                                                            +----------+-------+--------+--------+-----------------------+-----------------+ CCA Prox  48     8                                                        +----------+-------+--------+--------+-----------------------+-----------------+ CCA Distal38     8                                      intimal                                                                   thickening        +----------+-------+--------+--------+-----------------------+-----------------+ ICA Prox  35     9               heterogenous and  calcific                                 +----------+-------+--------+--------+-----------------------+-----------------+ ICA Distal57     14                                                       +----------+-------+--------+--------+-----------------------+-----------------+ ECA       41     0               heterogenous                             +----------+-------+--------+--------+-----------------------+-----------------+ +----------+--------+--------+----------------+-------------------+           PSV cm/sEDV cm/sDescribe        Arm Pressure (mmHG) +----------+--------+--------+----------------+-------------------+ [id number]              Multiphasic, WNL                    +----------+--------+--------+----------------+-------------------+  +---------+--------+--+--------+-+---------+ VertebralPSV cm/s33EDV cm/s9Antegrade +---------+--------+--+--------+-+---------+   Summary: Right Carotid: Velocities in the right ICA are consistent with a 1-39% stenosis. Left Carotid: Velocities in the left ICA are consistent with a 1-39% stenosis. Vertebrals:  Bilateral vertebral arteries demonstrate antegrade flow. Subclavians: Normal flow hemodynamics were seen in bilateral subclavian              arteries. *See table(s) above for measurements and observations.  Electronically signed by Antony Contras MD on 07/15/2021 at 9:56:34 AM.    Final    CT Head Wo Contrast  Result Date: 07/13/2021 CLINICAL DATA:  Transient ischemic attack (TIA) EXAM: CT HEAD WITHOUT CONTRAST TECHNIQUE: Contiguous axial images were obtained from the base of the skull through the vertex without intravenous contrast. RADIATION DOSE REDUCTION: This exam was performed according to the departmental dose-optimization program which includes automated exposure control, adjustment of the mA and/or kV according to patient size and/or use of iterative reconstruction technique. COMPARISON:  None Available. FINDINGS: Motion artifact is present. Brain: There is no acute intracranial hemorrhage, mass effect, or edema. Possible age-indeterminate cortical/subcortical infarcts in the frontoparietal lobes bilaterally. There is no extra-axial fluid collection. Prominence of the ventricles and sulci reflects mild generalized parenchymal volume loss. Patchy and confluent areas of low-density in the supratentorial Jeudy matter are nonspecific may reflect mild to moderate chronic microvascular ischemic changes. Vascular: No definite hyperdense vessel. Skull: Calvarium is unremarkable. Sinuses/Orbits: No acute finding. Other: None. IMPRESSION: Suboptimal evaluation due to motion degradation. No acute intracranial hemorrhage. Possible age-indeterminate cortical/subcortical infarcts in the frontoparietal  lobes bilaterally. Electronically Signed   By: Macy Mis M.D.   On: 07/13/2021 14:49    Microbiology: Results for orders placed or performed in visit on 09/07/20  Urine Culture     Status: Abnormal   Collection Time: 09/07/20  9:36 AM   Specimen: Urine  Result Value Ref Range Status   MICRO NUMBER: [id number]  Final   SPECIMEN QUALITY: Adequate  Final   Sample Source URINE, CLEAN CATCH  Final   STATUS: FINAL  Final   ISOLATE 1: Pseudomonas aeruginosa (A)  Final    Comment: Greater than 100,000 CFU/mL of Pseudomonas aeruginosa      Susceptibility   Pseudomonas aeruginosa - URINE CULTURE,  REFLEX    CEFTAZIDIME 4 Sensitive     CEFEPIME 4 Sensitive     CIPROFLOXACIN <=0.25 Sensitive     LEVOFLOXACIN 0.5 Sensitive     GENTAMICIN <=1 Sensitive     IMIPENEM 2 Sensitive     PIP/TAZO <=4 Sensitive     TOBRAMYCIN* <=1 Sensitive      * Legend: S = Susceptible  I = Intermediate R = Resistant  NS = Not susceptible * = Not tested  NR = Not reported **NN = See antimicrobic comments     Labs: CBC: Recent Labs  Lab 07/13/21 1315  WBC 9.5  NEUTROABS 4.9  HGB 15.0  HCT 44.4  MCV 92.3  PLT 482   Basic Metabolic Panel: Recent Labs  Lab 07/13/21 1315 07/15/21 0152  NA 131* 134*  K 5.2* 3.5  CL 100 104  CO2 22 21*  GLUCOSE 82 95  BUN 16 12  CREATININE 0.87 0.79  CALCIUM 9.0 8.8*   Liver Function Tests: Recent Labs  Lab 07/13/21 1315  AST 44*  ALT 18  ALKPHOS 42  BILITOT 0.7  PROT 6.2*  ALBUMIN 3.6   CBG: Recent Labs  Lab 07/13/21 1323  GLUCAP 87    Discharge time spent: greater than 30 minutes.  Signed: Leslee Home, DO [facility] 07/15/2021

## 2021-07-15 NOTE — Evaluation (Signed)
Speech Language Pathology Evaluation Patient Details Name: Gina Terry MRN: [id number] DOB: 27-Aug-1921 Today's Date: 07/15/2021 Time: 0940-1000 SLP Time Calculation (min) (ACUTE ONLY): 20 min  Problem List:  Patient Active Problem List   Diagnosis Date Noted   TIA (transient ischemic attack) 07/13/2021   Hyperkalemia 07/13/2021   Insomnia associated with menopause 02/23/2021   Gastroenteritis 02/04/2019   Colitis 01/25/2019   Weight loss, unintentional 09/23/2018   Chronic bilateral low back pain 04/19/2016   Gastroesophageal reflux 04/19/2016   Hypertension, essential 04/19/2016   Osteoporosis, 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/19/2016   Primary hypothyroidism 04/19/2016   Paroxysmal atrial fibrillation (HCC)    Left bundle branch block    OTHER CONSTIPATION 07/22/2007   ABDOMINAL PAIN, EPIGASTRIC 07/22/2007   GASTRITIS, CHRONIC 05/14/2007   DIVERTICULOSIS, COLON 05/14/2007   Past Medical History:  Past Medical History:  Diagnosis Date   Arthritis    GERD (gastroesophageal reflux disease)    History of bone density study    History of mammogram    Hypertension, essential 04/19/2016   Hypothyroid    Left bundle branch block    Osteoporosis    Paroxysmal atrial fibrillation [facility])    Past Surgical History:  Past Surgical History:  Procedure Laterality Date   APPENDECTOMY     CARDIOVASCULAR STRESS TEST  03/24/2009   EF 78%   CHOLECYSTECTOMY     COLONOSCOPY     TONSILLECTOMY AND ADENOIDECTOMY     US ECHOCARDIOGRAPHY  05/07/2008   Est EF 50-55%   HPI:  88yo female admitted with TIA, slurred speech. PMH: HTN, PAFib, LBBB, hypothyroidism, arthritis, osteoporosis. CTHead =No acute  intracranial hemorrhage. Possible age-indeterminate cortical/subcortical infarcts in the frontoparietal lobes bilaterally.   Assessment / Plan / Recommendation Clinical Impression  Pt seen at bedside to assess speech, language, and cogntion. Pt was awake and alert, seated upright in bed. Caregiver  present. Pt's speech is fully intelligible, although she reports continued slight slurring of words. Receptive and Expressive Language are intact. CN exam is unremarkable. The Mini-Mental State Exam (MMSE) was administered to assess cognition. Pt scored 30/30, WFL for pt age and education. 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 Her daughter assists with management of finances. Pt is independent with her medications. She has caregivers around the clock. No further ST intervention is recommended at this time. Please reconsult if needs arise.    SLP Assessment  SLP Recommendation/Assessment: Patient does not need any further Speech Language Pathology Services  SLP Visit Diagnosis: Cognitive communication deficit (R41.841)    Recommendations for follow up therapy are one component of a multi-disciplinary discharge planning process, led by the attending physician.  Recommendations may be updated based on patient status, additional functional criteria and insurance authorization.    Follow Up Recommendations  No SLP follow up    Assistance Recommended at Discharge  Other (comment) (pt has round the clock caregivers)  Functional Status Assessment Patient has not had a recent decline in their functional status     SLP Evaluation Cognition  Overall Cognitive Status: Within Functional Limits for tasks assessed       Comprehension  Auditory Comprehension Overall Auditory Comprehension: Appears within functional limits for tasks assessed    Expression Expression Primary Mode of Expression: Verbal Verbal Expression Overall Verbal Expression: Appears within functional limits for tasks assessed Written Expression Dominant Hand: Right   Oral / Motor  Oral Motor/Sensory Function Overall Oral Motor/Sensory Function: Within functional limits Motor Speech Overall Motor  Speech: Appears within functional limits for tasks assessed           Yandiel Bergum B. Quentin Ore, [facility],  [place] Speech Language Pathologist Office: [phone]  Shonna Chock 07/15/2021, 10:05 AM

## 2021-07-15 NOTE — Discharge Instructions (Signed)
1) Take aspirin and Plavix daily. Stop aspirin after 3 weeks. Continue with Plavix 2) Follow up with PCP 3) Follow up with Neurology 4) Get heart monitor placed

## 2021-07-15 NOTE — Progress Notes (Signed)
Patient and caregiver given discharge instructions and stated understanding.

## 2021-07-15 NOTE — TOC Transition Note (Signed)
Transition of Care [facility]) - CM/SW Discharge Note   Patient Details  Name: ARIELIS LEONHART MRN: [id number] Date of Birth: Sep 06, 1921  [facility] CM/SW Contact:  Pollie Friar, RN Phone Number: 07/15/2021, 11:05 AM   Clinical Narrative:    Patient is discharging home with her caregivers. No f/u per PT/OT and no new DME needs.  Pt has transportation home.    Final next level of care: Home/Self Care Barriers to Discharge: No Barriers Identified   Patient Goals and CMS Choice        Discharge Placement                       Discharge Plan and Services   Discharge Planning Services: CM Consult                                 Social Determinants of Health (SDOH) Interventions     Readmission Risk Interventions     No data to display

## 2021-07-15 NOTE — Plan of Care (Signed)

## 2021-07-15 NOTE — Plan of Care (Signed)
  Problem: Education: Goal: Knowledge of General Education information will improve Description Including pain rating scale, medication(s)/side effects and non-pharmacologic comfort measures Outcome: Progressing   

## 2021-07-15 NOTE — Progress Notes (Signed)
  Echocardiogram 2D Echocardiogram has been performed.  Gina Terry 07/15/2021, 9:24 AM

## 2021-07-18 ENCOUNTER — Telehealth: Payer: Self-pay | Admitting: Cardiology

## 2021-07-18 ENCOUNTER — Encounter: Payer: Self-pay | Admitting: *Deleted

## 2021-07-18 ENCOUNTER — Telehealth: Payer: Self-pay | Admitting: *Deleted

## 2021-07-18 NOTE — Telephone Encounter (Signed)
Pt states that she was in the hospital last week and was told to call and f/u about a Monitor. Please advise

## 2021-07-18 NOTE — Telephone Encounter (Signed)
Patient called and notified of message below, from [facility]. She voiced understanding.

## 2021-07-18 NOTE — Telephone Encounter (Addendum)
Transition Care Management Follow-up Telephone Call Date of discharge and from where: 07/15/21 [facility] How have you been since you were released from the hospital? "Good" Any questions or concerns? No  Items Reviewed: Did the pt receive and understand the discharge instructions provided? Yes  Medications obtained and verified? Yes  Other? No  Any new allergies since your discharge? No  Dietary orders reviewed? Yes Do you have support at home? Yes - has 24 hour care givers via [employer] and Equipment/Supplies: Were home health services ordered? no If so, what is the name of the agency? Not applicable  Has the agency set up a time to come to the patient's home? not applicable Were any new equipment or medical supplies ordered?  No What is the name of the medical supply agency? Not applicable Were you able to get the supplies/equipment? not applicable Do you have any questions related to the use of the equipment or supplies? Not Applicable  Functional Questionnaire: (I = Independent and D = Dependent) ADLs: Assist from round the clock caregivers  Bathing/Dressing- Assist from round the clock caregivers  Meal Prep- Assist from round the clock caregivers  Eating- I  Maintaining continence- I  Transferring/Ambulation- Assist with walker  Managing Meds- I  Follow up appointments reviewed:  PCP Hospital f/u appt confirmed? No  Not indicated per hospital d/c instructions Specialist Hospital f/u appt confirmed? No  Patient states she left message with Dr Doug Sou office today regarding need for hospital follow up appointment to arrange heart monitor. Per patient request, attempted to reach Dr Doug Sou office to ensure hospital follow up arranged for placement of cardiac monitor. Option for office to return call chosen. No review of chart indicate cardiology office is aware of need to schedule cardiac monitor placement.  Are transportation arrangements needed?  No  If their condition worsens, is the pt aware to call PCP or go to the Emergency Dept.? Yes Was the patient provided with contact information for the PCP's office or ED? Yes Was to pt encouraged to call back with questions or concerns? Yes   Kelli Churn RN, CCM, [facility] Care Management Coordinator  [phone]

## 2021-07-18 NOTE — Telephone Encounter (Signed)
Routed to [place]. to assist with monitor order (in epic) Unsure if will be mailed or patient to come to [facility] office to have put on

## 2021-07-18 NOTE — Telephone Encounter (Signed)
Rock Nephew, Shelly Allene Dillon, RN Caller: Unspecified (Today, 10:48 AM) Patient enrolled today.  [employer] has been having some inventory shortages.  It may be 10 days before the patient receives the monitor.

## 2021-07-22 ENCOUNTER — Telehealth: Payer: Self-pay | Admitting: Cardiology

## 2021-08-05 ENCOUNTER — Other Ambulatory Visit: Payer: Self-pay | Admitting: Family

## 2021-08-05 ENCOUNTER — Other Ambulatory Visit: Payer: Self-pay

## 2021-08-05 DIAGNOSIS — N951 Menopausal and female climacteric states: Secondary | ICD-10-CM

## 2021-08-05 MED ORDER — ZOLPIDEM TARTRATE 5 MG PO TABS: 5.0000 mg | ORAL_TABLET | Freq: Every day | ORAL | 1 refills | Status: DC

## 2021-08-05 MED ORDER — ZOLPIDEM TARTRATE 5 MG PO TABS
ORAL_TABLET | ORAL | 1 refills | Status: DC
Start: 2021-08-05 — End: 2021-08-05

## 2021-08-05 NOTE — Progress Notes (Signed)
Ambien refilled as requested.

## 2021-08-25 NOTE — Progress Notes (Deleted)
[facility] [address] [place]. [place] [address] [phone]       HOSPITAL FOLLOW UP NOTE  Ms. LYLLA EIFLER Date of Birth:  01-27-1922 Medical Record Number:  [id number]   Reason for Referral:  hospital stroke follow up    SUBJECTIVE:   CHIEF COMPLAINT:  No chief complaint on file.   HPI:   Gina Terry is a 88 year old female with history of PAF on aspirin 81, hypertension, LBBB, and hypothyroidism who presented on 07/13/2021 with episode of confusion, and word finding difficulties.  Symptoms resolved on ED arrival.  Personally reviewed hospitalization pertinent progress notes, lab work and imaging.  Evaluated by Dr. Erlinda Hong.  CT no acute abnormality.  Patient refused MRI or MRA due to claustrophobia.  Carotid Doppler negative.  EF 60 to 65%.  LDL 69, A1c 5.4.  Etiology of symptoms concerning for TIA likely due to A-fib not on [facility].  On aspirin 81 mg daily PTA without history of GI bleed, head trauma and frequent falls at home.  Recommended outpatient cardiac monitoring with established cardiologist Dr. Martinique as no known A-fib recurrence since 2016.  Recommended DAPT for 3 weeks then Plavix alone and no indication for statin therapy DL at goal.  PT/OT no recommendations and discharged home.        PERTINENT IMAGING  Per hospitalization 07/13/2021 CT Head shows no acute abnormality with possible age indeterminate cortical/subcortical infarct of fronto-parietal lobes bilateral, Patient refuses to follow up with any further imaging (MRI/MRA) Carotid duplex 1 to 39% stenosis bilaterally Patient declined MRI/MRA due to claustrophobia EF 60 to 65% LDL 69 A1c 5.4        ROS:   14 system review of systems performed and negative with exception of ***  PMH:  Past Medical History:  Diagnosis Date   Arthritis    GERD (gastroesophageal reflux disease)    History of bone density study    History of mammogram    Hypertension, essential 04/19/2016    Hypothyroid    Left bundle branch block    Osteoporosis    Paroxysmal atrial fibrillation (HCC)     PSH:  Past Surgical History:  Procedure Laterality Date   APPENDECTOMY     CARDIOVASCULAR STRESS TEST  03/24/2009   EF 78%   CHOLECYSTECTOMY     COLONOSCOPY     TONSILLECTOMY AND ADENOIDECTOMY     US ECHOCARDIOGRAPHY  05/07/2008   Est EF 50-55%    Social History:  Social History   Socioeconomic History   Marital status: Widowed    Spouse name: Not on file   Number of children: 2   Years of education: Not on file   Highest education level: Not on file  Occupational History   Occupation: retired  Tobacco Use   Smoking status: Former    Years: 31.00    Types: Cigarettes    Quit date: 01/31/1984    Years since quitting: 37.5   Smokeless tobacco: Never  Vaping Use   Vaping Use: Never used  Substance and Sexual Activity   Alcohol use: Yes    Comment: 3 oz of wine before dinner every night   Drug use: Never   Sexual activity: Not Currently  Other Topics Concern   Not on file  Social History Narrative   Diet:Regular      Caffeine: yes      Married, if yes what year: widowed, 1956      Do you live in a house, apartment, assisted  living, condo, trailer, ect: house      Is it one or more stories: basement      How many persons live in your home? 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 technologist      Exercise: no                 Type and how often:          Living Will: Yes   DNR: Yes   POA/HPOA: Yes      Functional Status:   Do you have difficulty bathing or dressing yourself? Yes   Do you have difficulty preparing food or eating? Yes   Do you have difficulty managing your medications? No   Do you have difficulty managing your finances? No   Do you have difficulty affording your medications? Yes   Social Determinants of Health   Financial Resource Strain: Not on  file  Food Insecurity: Not on file  Transportation Needs: Not on file  Physical Activity: Not on file  Stress: Not on file  Social Connections: Not on file  Intimate Partner Violence: Not on file    Family History:  Family History  Problem Relation Age of Onset   Hip fracture Mother    Heart attack Father    Breast cancer Sister    Stroke Brother     Medications:   Current Outpatient Medications on File Prior to Visit  Medication Sig Dispense Refill   acetaminophen (TYLENOL) 500 MG tablet Take 500 mg by mouth every 6 (six) hours as needed for mild pain.     amLODipine (NORVASC) 2.5 MG tablet Take 1 tablet (2.5 mg total) by mouth daily. 90 tablet 0   aspirin EC 81 MG tablet Take 1 tablet (81 mg total) by mouth daily for 21 days. Swallow whole. 21 tablet 0   calcium carbonate (TUMS - DOSED IN MG ELEMENTAL CALCIUM) 500 MG chewable tablet Chew 3 tablets by mouth daily as needed for indigestion or heartburn.     clopidogrel (PLAVIX) 75 MG tablet Take 1 tablet (75 mg total) by mouth daily. 90 tablet 0   levothyroxine (SYNTHROID) 50 MCG tablet Take 1 tablet (50 mcg total) by mouth daily before breakfast. 90 tablet 2   loperamide (IMODIUM A-D) 2 MG capsule Take 1 capsule (2 mg total) by mouth as needed for diarrhea or loose stools. 30 capsule 0   metoprolol succinate (TOPROL-XL) 25 MG 24 hr tablet TAKE 1 TABLET BY MOUTH EVERY DAY (Patient taking differently: Take 25 mg by mouth daily.) 90 tablet 3   nitroGLYCERIN (NITROSTAT) 0.4 MG SL tablet Place 1 tablet (0.4 mg total) under the tongue every 5 (five) minutes as needed for chest pain. 25 tablet 11   polyethylene glycol (MIRALAX / GLYCOLAX) 17 g packet Take 17 g by mouth daily as needed for mild constipation or moderate constipation.     zolpidem (AMBIEN) 5 MG tablet Take 1 tablet (5 mg total) by mouth at bedtime. 30 tablet 1   No current facility-administered medications on file prior to visit.    Allergies:   Allergies  Allergen  Reactions   Rofecoxib     Other reaction(s): Other (See Comments) reflux, edema    Sulfamethoxazole-Trimethoprim Nausea Only   Codeine Other (See Comments)    unknown Other reaction(s): GI Upset (intolerance)  Estradiol Rash    Local rash   Flurbiprofen     Other reaction(s): GI Upset (intolerance)   Ibuprofen     Other reaction(s): GI Upset (intolerance)      OBJECTIVE:  Physical Exam  There were no vitals filed for this visit. There is no height or weight on file to calculate BMI. No results found.     08/24/2020   10:51 AM  Depression screen PHQ 2/9  Decreased Interest 0  Down, Depressed, Hopeless 0  PHQ - 2 Score 0     General: well developed, well nourished, seated, in no evident distress Head: head normocephalic and atraumatic.   Neck: supple with no carotid or supraclavicular bruits Cardiovascular: regular rate and rhythm, no murmurs Musculoskeletal: no deformity Skin:  no rash/petichiae Vascular:  Normal pulses all extremities   Neurologic Exam Mental Status: Awake and fully alert. Oriented to place and time. Recent and remote memory intact. Attention span, concentration and fund of knowledge appropriate. Mood and affect appropriate.  Cranial Nerves: Fundoscopic exam reveals sharp disc margins. Pupils equal, briskly reactive to light. Extraocular movements full without nystagmus. Visual fields full to confrontation. Hearing intact. Facial sensation intact. Face, tongue, palate moves normally and symmetrically.  Motor: Normal bulk and tone. Normal strength in all tested extremity muscles Sensory.: intact to touch , pinprick , position and vibratory sensation.  Coordination: Rapid alternating movements normal in all extremities. Finger-to-nose and heel-to-shin performed accurately bilaterally. Gait and Station: Arises from chair without difficulty. Stance is normal. Gait demonstrates normal stride length and balance with ***. Tandem walk and heel toe ***.   Reflexes: 1+ and symmetric. Toes downgoing.     NIHSS  *** Modified Rankin  ***      ASSESSMENT: Gina Terry is a 88 y.o. year old female with recent likely TIA on 07/13/2021 possibly in setting of A-fib not on [facility]. Vascular risk factors include hx of A fib, HTN and advanced age.      PLAN:  TIA:  Residual deficit: ***.  Declined further cardiac monitoring -discussed indication for further monitoring as underlying A-fib not on [facility] increases risk of additional strokes/TIAs but she continues to decline interest.  She was encouraged to further follow-up with cardiologist Dr. Martinique for further discussion Continue clopidogrel 75 mg daily for secondary stroke prevention.   Discussed secondary stroke prevention measures and importance of close PCP follow up for aggressive stroke risk factor management including BP goal<130/90, HLD with LDL goal<70 and DM with A1c.<7 .  Stroke labs 06/2021: LDL 69, A1c 5.4 I have gone over the pathophysiology of stroke, warning signs and symptoms, risk factors and their management in some detail with instructions to go to the closest emergency room for symptoms of concern.     Follow up in *** or call earlier if needed   CC:  [facility] provider: Dr. Leonie Man PCP: Wardell Honour, MD    I spent *** minutes of face-to-face and non-face-to-face time with patient.  This included previsit chart review including review of recent hospitalization, lab review, study review, order entry, electronic health record documentation, patient education regarding recent stroke including etiology, secondary stroke prevention measures and importance of managing stroke risk factors, residual deficits and typical recovery time and answered all other questions to patient satisfaction   Frann Rider, AGNP-BC  [facility] [address] [place], [place] [address]  Phone [phone] Fax [phone] Note: This document was prepared with digital  dictation and possible smart phrase technology.  Any transcriptional errors that result from this process are unintentional.

## 2021-08-29 ENCOUNTER — Inpatient Hospital Stay: Payer: Medicare Other | Admitting: Adult Health

## 2021-09-01 ENCOUNTER — Other Ambulatory Visit: Payer: Self-pay | Admitting: Cardiology

## 2021-09-04 ENCOUNTER — Other Ambulatory Visit: Payer: Self-pay | Admitting: Family

## 2021-09-04 DIAGNOSIS — N951 Menopausal and female climacteric states: Secondary | ICD-10-CM

## 2021-09-05 NOTE — Telephone Encounter (Signed)
Patient has request refill on medication Ambien '5mg'$ . Patient medication has 1 refill remaining on medication. Patient has Non Opioid Contract dated 02/23/2021. Medication pend and sent to Sherrie Mustache, NP for approval.

## 2021-10-28 ENCOUNTER — Other Ambulatory Visit: Payer: Self-pay | Admitting: Cardiology

## 2021-12-01 ENCOUNTER — Other Ambulatory Visit: Payer: Self-pay | Admitting: Cardiology

## 2021-12-01 ENCOUNTER — Other Ambulatory Visit: Payer: Self-pay | Admitting: Nurse Practitioner

## 2021-12-01 DIAGNOSIS — E039 Hypothyroidism, unspecified: Secondary | ICD-10-CM

## 2021-12-01 DIAGNOSIS — N951 Menopausal and female climacteric states: Secondary | ICD-10-CM

## 2021-12-01 MED ORDER — LEVOTHYROXINE SODIUM 50 MCG PO TABS: 50.0000 ug | ORAL_TABLET | Freq: Every day | ORAL | 1 refills | Status: DC

## 2021-12-01 NOTE — Telephone Encounter (Signed)
Patient is requesting a refill of the following medications: Requested Prescriptions   Pending Prescriptions Disp Refills   zolpidem (AMBIEN) 5 MG tablet [Pharmacy Med Name: ZOLPIDEM TARTRATE 5 MG TABLET] 30 tablet 1    Sig: TAKE 1 TABLET BY MOUTH EVERYDAY AT BEDTIME    Date of last refill:09/05/2021  Refill amount: 90 tablets 1 refill  Treatment agreement date: 02/23/2021

## 2021-12-14 ENCOUNTER — Ambulatory Visit (INDEPENDENT_AMBULATORY_CARE_PROVIDER_SITE_OTHER): Payer: Medicare Other | Admitting: Family Medicine

## 2021-12-14 ENCOUNTER — Encounter: Payer: Self-pay | Admitting: Family Medicine

## 2021-12-14 VITALS — BP 110/60 | HR 58 | Temp 97.1°F | Ht 64.0 in | Wt 73.6 lb

## 2021-12-14 DIAGNOSIS — I209 Angina pectoris, unspecified: Secondary | ICD-10-CM

## 2021-12-14 DIAGNOSIS — T733XXA Exhaustion due to excessive exertion, initial encounter: Secondary | ICD-10-CM | POA: Diagnosis not present

## 2021-12-14 DIAGNOSIS — G459 Transient cerebral ischemic attack, unspecified: Secondary | ICD-10-CM

## 2021-12-14 DIAGNOSIS — I10 Essential (primary) hypertension: Secondary | ICD-10-CM | POA: Diagnosis not present

## 2021-12-14 DIAGNOSIS — N951 Menopausal and female climacteric states: Secondary | ICD-10-CM

## 2021-12-14 DIAGNOSIS — Z23 Encounter for immunization: Secondary | ICD-10-CM

## 2021-12-14 DIAGNOSIS — I480 Paroxysmal atrial fibrillation: Secondary | ICD-10-CM

## 2021-12-14 LAB — BASIC METABOLIC PANEL WITH GFR
BUN/Creatinine Ratio: 18 (calc) (ref 6–22)
BUN: 19 mg/dL (ref 7–25)
CO2: 26 mmol/L (ref 20–32)
Calcium: 9.7 mg/dL (ref 8.6–10.4)
Chloride: 97 mmol/L — ABNORMAL LOW (ref 98–110)
Creat: 1.03 mg/dL — ABNORMAL HIGH (ref 0.60–0.95)
Glucose, Bld: 148 mg/dL — ABNORMAL HIGH (ref 65–139)
Potassium: 4.9 mmol/L (ref 3.5–5.3)
Sodium: 132 mmol/L — ABNORMAL LOW (ref 135–146)
eGFR: 49 mL/min/{1.73_m2} — ABNORMAL LOW (ref >=60)

## 2021-12-14 LAB — CBC
HCT: 41.1 % (ref 35.0–45.0)
Hemoglobin: 14.5 g/dL (ref 11.7–15.5)
MCH: 31.6 pg (ref 27.0–33.0)
MCHC: 35.3 g/dL (ref 32.0–36.0)
MCV: 89.5 fL (ref 80.0–100.0)
MPV: 11.4 fL (ref 7.5–12.5)
Platelets: 249 10*3/uL (ref 140–400)
RBC: 4.59 10*6/uL (ref 3.80–5.10)
RDW: 12.9 % (ref 11.0–15.0)
WBC: 11.1 10*3/uL — ABNORMAL HIGH (ref 3.8–10.8)

## 2021-12-14 NOTE — Progress Notes (Signed)
Provider:  Alain Honey, MD  Careteam: Patient Care Team: Wardell Honour, MD as PCP - General (Family Medicine) Martinique, Peter M, MD as PCP - Cardiology (Cardiology) Martinique, Peter M, MD as Consulting Physician (Cardiology)  PLACE OF SERVICE:  [place]  Advanced Directive information    Allergies  Allergen Reactions   Rofecoxib     Other reaction(s): Other (See Comments) reflux, edema    Sulfamethoxazole-Trimethoprim Nausea Only   Codeine Other (See Comments)    unknown Other reaction(s): GI Upset (intolerance)   Estradiol Rash    Local rash   Flurbiprofen     Other reaction(s): GI Upset (intolerance)   Ibuprofen     Other reaction(s): GI Upset (intolerance)    No chief complaint on file.    HPI: Patient is a 88 y.o. female that is a nice little lady who comes in with her daughter today.  She has some concerns about her memory.  Daughter has not noticed any decline in her memory and today had asked her some simple questions such as what did you eat for supper last night and recalling 3 words at 5 minutes.  She did very well with both the simple questions. She continues to live in her house and has caregivers 24 hours a day.  They do not cook for her and she is on a waiting list to move to assisted living near her daughter in [place].  There is some reluctance since she has lived in her home for greater than 60 years. She is also having some problems with what sounds like irritable bowel syndrome with constipation diarrhea.  Review of Systems:  Review of Systems  Constitutional:  Positive for malaise/fatigue and weight loss.  HENT:  Positive for hearing loss.   Eyes: Negative.   Respiratory: Negative.    Cardiovascular: Negative.   Gastrointestinal:  Positive for constipation and diarrhea.  Genitourinary:  Positive for frequency.  Neurological:  Positive for weakness.  Psychiatric/Behavioral:  The patient has insomnia.   All other systems reviewed and are  negative.   Past Medical History:  Diagnosis Date   Arthritis    GERD (gastroesophageal reflux disease)    History of bone density study    History of mammogram    Hypertension, essential 04/19/2016   Hypothyroid    Left bundle branch block    Osteoporosis    Paroxysmal atrial fibrillation [facility])    Past Surgical History:  Procedure Laterality Date   APPENDECTOMY     CARDIOVASCULAR STRESS TEST  03/24/2009   EF 78%   CHOLECYSTECTOMY     COLONOSCOPY     TONSILLECTOMY AND ADENOIDECTOMY     US ECHOCARDIOGRAPHY  05/07/2008   Est EF 50-55%   Social History:   reports that she quit smoking about 37 years ago. Her smoking use included cigarettes. She has never used smokeless tobacco. She reports current alcohol use. She reports that she does not use drugs.  Family History  Problem Relation Age of Onset   Hip fracture Mother    Heart attack Father    Breast cancer Sister    Stroke Brother     Medications: Patient's Medications  New Prescriptions   No medications on file  Previous Medications   ACETAMINOPHEN (TYLENOL) 500 MG TABLET    Take 500 mg by mouth every 6 (six) hours as needed for mild pain.   AMLODIPINE (NORVASC) 2.5 MG TABLET    TAKE 1 TABLET BY MOUTH EVERY DAY  ASPIRIN EC 81 MG TABLET    Take 1 tablet (81 mg total) by mouth daily for 21 days. Swallow whole.   CALCIUM CARBONATE (TUMS - DOSED IN MG ELEMENTAL CALCIUM) 500 MG CHEWABLE TABLET    Chew 3 tablets by mouth daily as needed for indigestion or heartburn.   CLOPIDOGREL (PLAVIX) 75 MG TABLET    TAKE 1 TABLET BY MOUTH EVERY DAY   LEVOTHYROXINE (SYNTHROID) 50 MCG TABLET    Take 1 tablet (50 mcg total) by mouth daily before breakfast.   LOPERAMIDE (IMODIUM A-D) 2 MG CAPSULE    Take 1 capsule (2 mg total) by mouth as needed for diarrhea or loose stools.   METOPROLOL SUCCINATE (TOPROL-XL) 25 MG 24 HR TABLET    Take 1 tablet (25 mg total) by mouth daily.   NITROGLYCERIN (NITROSTAT) 0.4 MG SL TABLET    Place 1 tablet  (0.4 mg total) under the tongue every 5 (five) minutes as needed for chest pain.   POLYETHYLENE GLYCOL (MIRALAX / GLYCOLAX) 17 G PACKET    Take 17 g by mouth daily as needed for mild constipation or moderate constipation.   ZOLPIDEM (AMBIEN) 5 MG TABLET    TAKE 1 TABLET BY MOUTH EVERYDAY AT BEDTIME  Modified Medications   No medications on file  Discontinued Medications   No medications on file    Physical Exam:  There were no vitals filed for this visit. There is no height or weight on file to calculate BMI. Wt Readings from Last 3 Encounters:  07/13/21 75 lb (34 kg)  07/01/21 74 lb (33.6 kg)  02/23/21 73 lb (33.1 kg)    Physical Exam Vitals and nursing note reviewed.  Constitutional:      Appearance: Normal appearance.  HENT:     Head: Normocephalic.  Cardiovascular:     Rate and Rhythm: Normal rate and regular rhythm.  Pulmonary:     Effort: Pulmonary effort is normal.     Breath sounds: Normal breath sounds.  Musculoskeletal:     Comments: Patient is seated in a wheelchair but uses a walker at home  Skin:    General: Skin is warm and dry.  Neurological:     General: No focal deficit present.     Mental Status: She is alert and oriented to person, place, and time.     Labs reviewed: Basic Metabolic Panel: Recent Labs    03/25/21 1100 06/03/21 1150 07/13/21 1315 07/15/21 0152  NA  --   --  131* 134*  K  --   --  5.2* 3.5  CL  --   --  100 104  CO2  --   --  22 21*  GLUCOSE  --   --  82 95  BUN  --   --  16 12  CREATININE  --   --  0.87 0.79  CALCIUM  --   --  9.0 8.8*  TSH 7.57* 3.39  --   --    Liver Function Tests: Recent Labs    07/13/21 1315  AST 44*  ALT 18  ALKPHOS 42  BILITOT 0.7  PROT 6.2*  ALBUMIN 3.6   No results for input(s): "LIPASE", "AMYLASE" in the last 8760 hours. No results for input(s): "AMMONIA" in the last 8760 hours. CBC: Recent Labs    07/13/21 1315  WBC 9.5  NEUTROABS 4.9  HGB 15.0  HCT 44.4  MCV 92.3  PLT 233    Lipid Panel: Recent Labs  07/13/21 1315  CHOL 156  HDL 62  LDLCALC 69  TRIG 123  CHOLHDL 2.5   TSH: Recent Labs    03/25/21 1100 06/03/21 1150  TSH 7.57* 3.39   A1C: Lab Results  Component Value Date   HGBA1C 5.4 07/13/2021     Assessment/Plan  1. Fatigue due to excessive exertion, initial encounter Patient's fatigue is related to exertion.  I think generally she does not move around very much and muscles have become weaker with lack of activity.  We will check hemoglobin as well as sodium and potassium.  She had thyroid checked within the last 5 months  2. Need for influenza vaccination Flu shot given 3. Hypertension, essential Blood pressure is good at 110/60 current medications include amlodipine 2.5 metoprolol 25 mg/day  4. Insomnia associated with menopause Continues to suffer with insomnia.  She asked about the possibility of using melatonin  5. Paroxysmal atrial fibrillation (HCC) Heart rhythm is regular today she is not on any anticoagulant but just rate control or, metoprolol  6. TIA (transient ischemic attack) She had an episode last summer where she could not recall words.  It was very transient.  Since then she has been on Plavix.  I told her that she may substitute baby aspirin in place of Plavix and take 2-3 times a week   Alain Honey, MD [place] [phone]

## 2021-12-14 NOTE — Patient Instructions (Signed)
May take baby ASA in place of Plavix

## 2022-01-20 ENCOUNTER — Telehealth: Payer: Medicare Other

## 2022-01-20 NOTE — Telephone Encounter (Signed)
Patient was advised and verbalized understanding. 

## 2022-01-20 NOTE — Telephone Encounter (Signed)
Patient called and reported diarrhea for 2 days, abdominal pain and have constipation. She stops taking her Miralax for 2 days now due to the diarrhea. She stated it might be colitis again and want to know if she should go to the hospital. Please advise

## 2022-01-26 ENCOUNTER — Emergency Department (HOSPITAL_BASED_OUTPATIENT_CLINIC_OR_DEPARTMENT_OTHER)
Admission: EM | Admit: 2022-01-26 | Discharge: 2022-01-26 | Disposition: A | Discharge status: Home / Self Care | Payer: Medicare Other | Attending: Emergency Medicine | Admitting: Emergency Medicine

## 2022-01-26 ENCOUNTER — Encounter (HOSPITAL_BASED_OUTPATIENT_CLINIC_OR_DEPARTMENT_OTHER): Payer: Self-pay | Admitting: Emergency Medicine

## 2022-01-26 ENCOUNTER — Other Ambulatory Visit: Payer: Self-pay

## 2022-01-26 ENCOUNTER — Emergency Department (HOSPITAL_BASED_OUTPATIENT_CLINIC_OR_DEPARTMENT_OTHER): Payer: Medicare Other

## 2022-01-26 DIAGNOSIS — I4891 Unspecified atrial fibrillation: Secondary | ICD-10-CM | POA: Diagnosis not present

## 2022-01-26 DIAGNOSIS — Z7902 Long term (current) use of antithrombotics/antiplatelets: Secondary | ICD-10-CM | POA: Insufficient documentation

## 2022-01-26 DIAGNOSIS — Z79899 Other long term (current) drug therapy: Secondary | ICD-10-CM | POA: Insufficient documentation

## 2022-01-26 DIAGNOSIS — R197 Diarrhea, unspecified: Secondary | ICD-10-CM | POA: Diagnosis not present

## 2022-01-26 DIAGNOSIS — R109 Unspecified abdominal pain: Secondary | ICD-10-CM | POA: Diagnosis not present

## 2022-01-26 DIAGNOSIS — R110 Nausea: Secondary | ICD-10-CM | POA: Insufficient documentation

## 2022-01-26 DIAGNOSIS — M545 Low back pain, unspecified: Secondary | ICD-10-CM

## 2022-01-26 DIAGNOSIS — I10 Essential (primary) hypertension: Secondary | ICD-10-CM | POA: Diagnosis not present

## 2022-01-26 DIAGNOSIS — M5442 Lumbago with sciatica, left side: Secondary | ICD-10-CM | POA: Diagnosis not present

## 2022-01-26 LAB — CBC WITH DIFFERENTIAL/PLATELET
Abs Immature Granulocytes: 0.25 10*3/uL — ABNORMAL HIGH (ref 0.00–0.07)
Basophils Absolute: 0 10*3/uL (ref 0.0–0.1)
Basophils Relative: 0 %
Eosinophils Absolute: 0 10*3/uL (ref 0.0–0.5)
Eosinophils Relative: 0 %
HCT: 44.4 % (ref 36.0–46.0)
Hemoglobin: 14.6 g/dL (ref 12.0–15.0)
Immature Granulocytes: 2 %
Lymphocytes Relative: 7 %
Lymphs Abs: 1.1 10*3/uL (ref 0.7–4.0)
MCH: 30 pg (ref 26.0–34.0)
MCHC: 32.9 g/dL (ref 30.0–36.0)
MCV: 91.4 fL (ref 80.0–100.0)
Monocytes Absolute: 4.1 10*3/uL — ABNORMAL HIGH (ref 0.1–1.0)
Monocytes Relative: 27 %
Neutro Abs: 9.4 10*3/uL — ABNORMAL HIGH (ref 1.7–7.7)
Neutrophils Relative %: 64 %
Platelets: 260 10*3/uL (ref 150–400)
RBC: 4.86 MIL/uL (ref 3.87–5.11)
RDW: 13.8 % (ref 11.5–15.5)
Smear Review: NORMAL
WBC: 14.9 10*3/uL — ABNORMAL HIGH (ref 4.0–10.5)
nRBC: 0 % (ref 0.0–0.2)

## 2022-01-26 LAB — URINALYSIS, ROUTINE W REFLEX MICROSCOPIC
Bacteria, UA: NONE SEEN
Bilirubin Urine: NEGATIVE
Glucose, UA: NEGATIVE mg/dL
Ketones, ur: NEGATIVE mg/dL
Leukocytes,Ua: NEGATIVE
Nitrite: NEGATIVE
Specific Gravity, Urine: 1.021 (ref 1.005–1.030)
pH: 6.5 (ref 5.0–8.0)

## 2022-01-26 LAB — COMPREHENSIVE METABOLIC PANEL
ALT: 10 U/L (ref 0–44)
AST: 24 U/L (ref 15–41)
Albumin: 3.8 g/dL (ref 3.5–5.0)
Alkaline Phosphatase: 49 U/L (ref 38–126)
Anion gap: 11 (ref 5–15)
BUN: 15 mg/dL (ref 8–23)
CO2: 21 mmol/L — ABNORMAL LOW (ref 22–32)
Calcium: 9.3 mg/dL (ref 8.9–10.3)
Chloride: 97 mmol/L — ABNORMAL LOW (ref 98–111)
Creatinine, Ser: 0.93 mg/dL (ref 0.44–1.00)
GFR, Estimated: 56 mL/min — ABNORMAL LOW (ref >60)
Glucose, Bld: 126 mg/dL — ABNORMAL HIGH (ref 70–99)
Potassium: 4.8 mmol/L (ref 3.5–5.1)
Sodium: 129 mmol/L — ABNORMAL LOW (ref 135–145)
Total Bilirubin: 0.6 mg/dL (ref 0.3–1.2)
Total Protein: 7 g/dL (ref 6.5–8.1)

## 2022-01-26 LAB — LIPASE, BLOOD: Lipase: 49 U/L (ref 11–51)

## 2022-01-26 MED ORDER — ACETAMINOPHEN 500 MG PO TABS
1000.0000 mg | ORAL_TABLET | Freq: Once | ORAL | Status: AC
  Administered 2022-01-26: 1000 mg via ORAL
  Filled 2022-01-26: qty 2

## 2022-01-26 MED ORDER — LIDOCAINE 5 % EX PTCH: 1.0000 | MEDICATED_PATCH | CUTANEOUS | 0 refills | Status: DC

## 2022-01-26 MED ORDER — LIDOCAINE 5 % EX PTCH
1.0000 | MEDICATED_PATCH | CUTANEOUS | Status: DC
  Administered 2022-01-26: 1 via TRANSDERMAL
  Filled 2022-01-26: qty 1

## 2022-01-26 MED ORDER — IOHEXOL 300 MG/ML  SOLN
100.0000 mL | Freq: Once | INTRAMUSCULAR | Status: AC | PRN
  Administered 2022-01-26: 50 mL via INTRAVENOUS

## 2022-01-26 NOTE — Discharge Instructions (Signed)
You were seen for your back and abdominal pain in the emergency department.   At home, please take Tylenol and use lidocaine patches that we have prescribed you for your pain.    Follow-up with your primary doctor in 2-3 days regarding your visit.    Return immediately to the emergency department if you experience any of the following: Fevers, vomiting, numbness or weakness of your legs, or any other concerning symptoms.    Thank you for visiting our Emergency Department. It was a pleasure taking care of you today.

## 2022-01-26 NOTE — ED Provider Notes (Signed)
[facility] EMERGENCY DEPT Provider Note   CSN: [id number] Arrival date & time: 01/26/22  1048     History  No chief complaint on file.   Gina Terry is a 88 y.o. female.  88 year old female with a history of atrial fibrillation on metoprolol, aspirin, Plavix, hypertension, and self-reported colitis who presents to the emergency department with abdominal pain.  Patient reports that she has been having abdominal pain that started on Christmas.  Describes it is lower and also radiating across her lower back as well.  No exacerbating or alleviating factors.  Has also had diarrhea.  Has had nausea without vomiting.  No fevers.  Did have a home test for UTI that was reportedly positive but denies any dysuria or frequency.  No radiation of the pain down her legs.  No recent trauma to the back or the abdomen.       Home Medications Prior to Admission medications   Medication Sig Start Date End Date Taking? Authorizing Provider  lidocaine (LIDODERM) 5 % Place 1 patch onto the skin daily. Remove & Discard patch within 12 hours or as directed by MD 01/26/22  Yes Fransico Meadow, MD  acetaminophen (TYLENOL) 500 MG tablet Take 500 mg by mouth every 6 (six) hours as needed for mild pain.    [provider]  amLODipine (NORVASC) 2.5 MG tablet TAKE 1 TABLET BY MOUTH EVERY DAY 12/01/21   Martinique, Peter M, MD  calcium carbonate (TUMS - DOSED IN MG ELEMENTAL CALCIUM) 500 MG chewable tablet Chew 3 tablets by mouth daily as needed for indigestion or heartburn.    [provider]  clopidogrel (PLAVIX) 75 MG tablet TAKE 1 TABLET BY MOUTH EVERY DAY 12/01/21   [place], Peter M, MD  levothyroxine (SYNTHROID) 50 MCG tablet Take 1 tablet (50 mcg total) by mouth daily before breakfast. 12/01/21   Wardell Honour, MD  loperamide (IMODIUM A-D) 2 MG capsule Take 1 capsule (2 mg total) by mouth as needed for diarrhea or loose stools. 08/27/20   Ngetich, Dinah C, NP  metoprolol  succinate (TOPROL-XL) 25 MG 24 hr tablet Take 1 tablet (25 mg total) by mouth daily. 10/28/21   [place], Peter M, MD  nitroGLYCERIN (NITROSTAT) 0.4 MG SL tablet Place 1 tablet (0.4 mg total) under the tongue every 5 (five) minutes as needed for chest pain. Patient not taking: Reported on 12/14/2021 02/19/19 01/31/48  Martin[place], Peter M, MD  polyethylene glycol (MIRALAX / GLYCOLAX) 17 g packet Take 17 g by mouth daily as needed for mild constipation or moderate constipation.    [provider]  zolpidem (AMBIEN) 5 MG tablet TAKE 1 TABLET BY MOUTH EVERYDAY AT BEDTIME 12/01/21   Wardell Honour, MD      Allergies    Rofecoxib, Sulfamethoxazole-trimethoprim, Codeine, Estradiol, Flurbiprofen, and Ibuprofen    Review of Systems   Review of Systems  Physical Exam Updated Vital Signs BP (!) 158/73   Pulse (!) 53   Temp (!) 97.4 F (36.3 C) (Oral)   Resp 16   SpO2 94%  Physical Exam Vitals and nursing note reviewed.  Constitutional:      General: She is not in acute distress.    Appearance: She is well-developed.  HENT:     Head: Normocephalic and atraumatic.     Right Ear: External ear normal.     Left Ear: External ear normal.     Nose: Nose normal.  Eyes:     Extraocular Movements: Extraocular movements  intact.     Conjunctiva/sclera: Conjunctivae normal.     Pupils: Pupils are equal, round, and reactive to light.  Cardiovascular:     Rate and Rhythm: Normal rate and regular rhythm.     Heart sounds: No murmur heard. Pulmonary:     Effort: Pulmonary effort is normal. No respiratory distress.     Breath sounds: Normal breath sounds.  Abdominal:     General: Abdomen is flat. There is no distension.     Palpations: Abdomen is soft. There is no mass.     Tenderness: There is abdominal tenderness (Left lower quadrant, epigastrium). There is left CVA tenderness. There is no right CVA tenderness or guarding.  Musculoskeletal:     Cervical back: Normal range of motion and neck  supple.     Right lower leg: No edema.     Left lower leg: No edema.  Skin:    General: Skin is warm and dry.  Neurological:     Mental Status: She is alert and oriented to person, place, and time. Mental status is at baseline.     Comments: No spinal midline tenderness to palpation in thoracic or lumbar region.  Psychiatric:        Mood and Affect: Mood normal.     ED Results / Procedures / Treatments   Labs (all labs ordered are listed, but only abnormal results are displayed) Labs Reviewed  COMPREHENSIVE METABOLIC PANEL - Abnormal; Notable for the following components:      Result Value   Sodium 129 (*)    Chloride 97 (*)    CO2 21 (*)    Glucose, Bld 126 (*)    GFR, Estimated 56 (*)    All other components within normal limits  CBC WITH DIFFERENTIAL/PLATELET - Abnormal; Notable for the following components:   WBC 14.9 (*)    Neutro Abs 9.4 (*)    Monocytes Absolute 4.1 (*)    Abs Immature Granulocytes 0.25 (*)    All other components within normal limits  URINALYSIS, ROUTINE W REFLEX MICROSCOPIC - Abnormal; Notable for the following components:   Hgb urine dipstick TRACE (*)    Protein, ur TRACE (*)    All other components within normal limits  LIPASE, BLOOD  PATHOLOGIST SMEAR REVIEW    EKG None  Radiology CT ABDOMEN PELVIS W CONTRAST  Result Date: 01/26/2022 CLINICAL DATA:  Diarrhea, lower back pain EXAM: CT ABDOMEN AND PELVIS WITH CONTRAST TECHNIQUE: Multidetector CT imaging of the abdomen and pelvis was performed using the standard protocol following bolus administration of intravenous contrast. RADIATION DOSE REDUCTION: This exam was performed according to the departmental dose-optimization program which includes automated exposure control, adjustment of the mA and/or kV according to patient size and/or use of iterative reconstruction technique. CONTRAST:  40m OMNIPAQUE IOHEXOL 300 MG/ML  SOLN COMPARISON:  August 21, 2020. FINDINGS: Lower chest: Minimal bibasilar  subsegmental atelectasis or scarring. Hepatobiliary: No focal liver abnormality is seen. Status post cholecystectomy. No biliary dilatation. Pancreas: Unremarkable. No pancreatic ductal dilatation or surrounding inflammatory changes. Spleen: Normal in size without focal abnormality. Adrenals/Urinary Tract: Adrenal glands and kidneys are unremarkable. Urinary bladder is decompressed. Stomach/Bowel: The stomach appears normal. There is no evidence of bowel obstruction or inflammation. Sigmoid diverticulosis is noted. Status post appendectomy. Vascular/Lymphatic: Aortic atherosclerosis. No enlarged abdominal or pelvic lymph nodes. Reproductive: Uterus and bilateral adnexa are unremarkable. Other: No abdominal wall hernia or abnormality. No abdominopelvic ascites. Musculoskeletal: No acute or significant osseous findings. IMPRESSION: Sigmoid diverticulosis without  inflammation. No acute abnormality seen in the abdomen or pelvis. Aortic Atherosclerosis (ICD10-I70.0). Electronically Signed   By: Marijo Conception M.D.   On: 01/26/2022 15:28    Procedures Procedures   Medications Ordered in ED Medications  acetaminophen (TYLENOL) tablet 1,000 mg (1,000 mg Oral Given 01/26/22 1531)  iohexol (OMNIPAQUE) 300 MG/ML solution 100 mL (50 mLs Intravenous Contrast Given 01/26/22 1506)    ED Course/ Medical Decision Making/ A&P                           Medical Decision Making Amount and/or Complexity of Data Reviewed Labs: ordered. Radiology: ordered.  Risk OTC drugs. Prescription drug management.   KRIS BURD is a 88 y.o. female with comorbidities that complicate the patient evaluation including atrial fibrillation on metoprolol, aspirin, Plavix, hypertension, and self-reported colitis who presents to the emergency department with abdominal pain.    Initial Ddx:  Diverticulitis, colitis, UTI, pyelonephritis, MSK pain  MDM:  Feel the patient may have diverticulitis or colitis with her history and left  lower quadrant and epigastric tenderness to palpation.  Considered UTI given her home test but does not have dysuria or frequency.  Left-sided CVA tenderness may be due to pyelonephritis so we will obtain urinalysis here.  If above workup is negative may be due to musculoskeletal pain.  No radiation to her legs or other neurologic symptoms that would be concerning for cord compression.   Plan:  Labs Urinalysis Tylenol Lidocaine patch CT abdomen pelvis  ED Summary/Re-evaluation:  Patient was feeling much better after the above medications.  CT abdomen pelvis did not reveal acute abnormality that would explain her symptoms.  Urinalysis not consistent with UTI.  Feel the patient's symptoms are likely due to musculoskeletal pain and was instructed to take Tylenol and use lidocaine patches as needed for pain.  Instructed to follow-up with her primary doctor in 2 to 3 days.  This patient presents to the ED for concern of complaints listed in HPI, this involves an extensive number of treatment options, and is a complaint that carries with it a high risk of complications and morbidity. Disposition including potential need for admission considered.   Dispo: DC Home. Return precautions discussed including, but not limited to, those listed in the AVS. Allowed pt time to ask questions which were answered fully prior to dc.  Additional history obtained from  caretaker Records reviewed Outpatient Clinic Notes The following labs were independently interpreted: Urinalysis and show no acute abnormality I independently reviewed the following imaging with scope of interpretation limited to determining acute life threatening conditions related to emergency care: CT Abdomen/Pelvis and agree with the radiologist interpretation with the following exceptions: None I personally reviewed and interpreted cardiac monitoring: atrial fibrillation (normal rate) I have reviewed the patients home medications and made  adjustments as needed  Social Determinants of health:  Elderly  Final Clinical Impression(s) / ED Diagnoses Final diagnoses:  Acute left-sided low back pain, unspecified whether sciatica present  Abdominal pain, unspecified abdominal location    Rx / DC Orders ED Discharge Orders          Ordered    lidocaine (LIDODERM) 5 %  Every 24 hours        01/26/22 1647              Fransico Meadow, MD 01/27/22 1304

## 2022-01-26 NOTE — ED Notes (Signed)
Pt c/o pain in back 8/10. Pt has multiple bony prominences but no skin breakdown. Warm blankets applied. Awaiting orders.

## 2022-01-26 NOTE — ED Notes (Signed)
Pt given discharge instructions and reviewed prescriptions. Opportunities given for questions. Pt verbalizes understanding. Kadin Bera R, RN 

## 2022-01-26 NOTE — ED Triage Notes (Signed)
Pt here from home with c/o lower abd pain and possible uti , hx of gastritis

## 2022-01-29 LAB — PATHOLOGIST SMEAR REVIEW

## 2022-01-31 ENCOUNTER — Other Ambulatory Visit: Payer: Self-pay | Admitting: Family Medicine

## 2022-01-31 DIAGNOSIS — N951 Menopausal and female climacteric states: Secondary | ICD-10-CM

## 2022-02-13 ENCOUNTER — Ambulatory Visit: Payer: Medicare Other | Admitting: Family

## 2022-02-14 ENCOUNTER — Ambulatory Visit: Payer: Medicare HMO | Admitting: Family

## 2022-02-14 ENCOUNTER — Encounter: Payer: Self-pay | Admitting: Family

## 2022-02-14 VITALS — BP 138/88 | HR 66 | Temp 97.9°F | Ht 64.0 in | Wt 73.8 lb

## 2022-02-14 DIAGNOSIS — G8929 Other chronic pain: Secondary | ICD-10-CM | POA: Diagnosis not present

## 2022-02-14 DIAGNOSIS — M545 Low back pain, unspecified: Secondary | ICD-10-CM

## 2022-02-14 MED ORDER — GABAPENTIN 100 MG PO CAPS: 100.0000 mg | ORAL_CAPSULE | Freq: Every day | ORAL | 1 refills | Status: DC | PRN

## 2022-02-14 NOTE — Progress Notes (Signed)
Provider: Antwan Bribiesca FNP-C  Wardell Honour, MD  Patient Care Team: Wardell Honour, MD as PCP - General (Family Medicine) Martinique, Peter M, MD as PCP - Cardiology (Cardiology) Martinique, Peter M, MD as Consulting Physician (Cardiology)  Extended Emergency Contact Information Primary Emergency Contact: [place] of [place] Phone: [phone] Relation: Daughter  Code Status:  Full Code  Goals of care: Advanced Directive information    07/13/2021    1:09 PM  Advanced Directives  Does Patient Have a Medical Advance Directive? Yes  Type of Advance Directive [place]  Does patient want to make changes to medical advance directive? No - Patient declined  Copy of [place] in Chart? No - copy requested, Physician notified     Chief Complaint  Patient presents with   Acute Visit    Patient complains of bowel changes (diarrhea then constipation) x several years, tried imodium and heating pad.  Back pain x 3 weeks (electric spasms), tried tylenol, and minor ankle swelling (left is worse). Here with caregiver, Gregary Signs.     HPI:  Pt is a 89 y.o. female seen today for an acute visit for evaluation of worsening lower back pain.she is here with her Care giver.states was seen at the Urgent care on  01/26/2022 with similar pain.Urine analysis was negative for UTI.CT scan of abdomen and pelvis done did not reveal any acute abnormalities.lower back pain thought due to musculoskeletal pain was advised to take Tylenol and use lidocaine patches as needed for pain.States has used patches and tylenol without any relief.pain described as sharp worst with movement.No radiation to lower extremities. Bowels have been moving well sometimes has diarrhea and at times constipation.Has had issues with her bowels for several years.   Past Medical History:  Diagnosis Date   Arthritis    GERD (gastroesophageal reflux disease)    History of bone  density study    History of mammogram    Hypertension, essential 04/19/2016   Hypothyroid    Left bundle branch block    Osteoporosis    Paroxysmal atrial fibrillation [facility])    Past Surgical History:  Procedure Laterality Date   APPENDECTOMY     CARDIOVASCULAR STRESS TEST  03/24/2009   EF 78%   CHOLECYSTECTOMY     COLONOSCOPY     TONSILLECTOMY AND ADENOIDECTOMY     US ECHOCARDIOGRAPHY  05/07/2008   Est EF 50-55%    Allergies  Allergen Reactions   Rofecoxib     Other reaction(s): Other (See Comments) reflux, edema    Sulfamethoxazole-Trimethoprim Nausea Only   Codeine Other (See Comments)    unknown Other reaction(s): GI Upset (intolerance)   Estradiol Rash    Local rash   Flurbiprofen     Other reaction(s): GI Upset (intolerance)   Ibuprofen     Other reaction(s): GI Upset (intolerance)    Outpatient Encounter Medications as of 02/14/2022  Medication Sig   acetaminophen (TYLENOL) 500 MG tablet Take 500 mg by mouth every 6 (six) hours as needed for mild pain.   amLODipine (NORVASC) 2.5 MG tablet TAKE 1 TABLET BY MOUTH EVERY DAY   calcium carbonate (TUMS - DOSED IN MG ELEMENTAL CALCIUM) 500 MG chewable tablet Chew 3 tablets by mouth daily as needed for indigestion or heartburn.   clopidogrel (PLAVIX) 75 MG tablet TAKE 1 TABLET BY MOUTH EVERY DAY   levothyroxine (SYNTHROID) 50 MCG tablet Take 1 tablet (50 mcg total) by mouth daily before breakfast.  lidocaine (LIDODERM) 5 % Place 1 patch onto the skin daily. Remove & Discard patch within 12 hours or as directed by MD   loperamide (IMODIUM A-D) 2 MG capsule Take 1 capsule (2 mg total) by mouth as needed for diarrhea or loose stools.   metoprolol succinate (TOPROL-XL) 25 MG 24 hr tablet Take 1 tablet (25 mg total) by mouth daily.   polyethylene glycol (MIRALAX / GLYCOLAX) 17 g packet Take 17 g by mouth daily as needed for mild constipation or moderate constipation.   zolpidem (AMBIEN) 5 MG tablet TAKE 1 TABLET BY MOUTH  EVERYDAY AT BEDTIME   nitroGLYCERIN (NITROSTAT) 0.4 MG SL tablet Place 1 tablet (0.4 mg total) under the tongue every 5 (five) minutes as needed for chest pain. (Patient not taking: Reported on 02/14/2022)   No facility-administered encounter medications on file as of 02/14/2022.    Review of Systems  Constitutional:  Negative for appetite change, chills, fatigue, fever and unexpected weight change.  HENT:  Positive for hearing loss. Negative for congestion, dental problem, ear discharge, ear pain, facial swelling, nosebleeds, postnasal drip, rhinorrhea, sinus pressure, sinus pain, sneezing, sore throat, tinnitus and trouble swallowing.   Respiratory:  Negative for cough, chest tightness, shortness of breath and wheezing.   Cardiovascular:  Negative for chest pain, palpitations and leg swelling.  Gastrointestinal:  Negative for abdominal distention, abdominal pain, blood in stool, constipation, diarrhea, nausea and vomiting.       Chronic diarrhea and constipation.  Genitourinary:  Negative for difficulty urinating, dysuria, flank pain, frequency and urgency.  Musculoskeletal:  Positive for arthralgias, back pain and gait problem. Negative for joint swelling, myalgias, neck pain and neck stiffness.  Skin:  Negative for color change, pallor, rash and wound.  Neurological:  Negative for weakness and numbness.    Immunization History  Administered Date(s) Administered   Fluad Quad(high Dose 65+) 02/23/2021, 12/14/2021   Influenza Split 11/05/2017   Influenza, High Dose Seasonal PF 10/31/2016, 11/12/2018, 01/05/2020   Influenza,inj,Quad PF,6+ Mos 11/05/2017   Influenza,inj,Quad PF,6-35 Mos 11/05/2017   PFIZER(Purple Top)SARS-COV-2 Vaccination 09/11/2019   Pertinent  Health Maintenance Due  Topic Date Due   DEXA SCAN  Never done   INFLUENZA VACCINE  Completed      07/14/2021    7:30 PM 07/15/2021    7:38 AM 12/14/2021    1:35 PM 01/26/2022   11:24 AM 02/14/2022   12:50 PM  Fall Risk   Falls in the past year?   0  0  Was there an injury with Fall?   0  0  Fall Risk Category Calculator   0  0  Fall Risk Category (Retired)   Low    (RETIRED) Patient Fall Risk Level Moderate fall risk Moderate fall risk Low fall risk High fall risk   Patient at Risk for Falls Due to   No Fall Risks  No Fall Risks  Fall risk Follow up     Falls evaluation completed   Functional Status Survey:    Vitals:   02/14/22 1248  BP: 138/88  Pulse: 66  Temp: 97.9 F (36.6 C)  TempSrc: Temporal  SpO2: 95%  Weight: 73 lb 12.8 oz (33.5 kg)  Height: '5\' 4"'$  (1.626 m)   Body mass index is 12.67 kg/m. Physical Exam Vitals reviewed.  Constitutional:      General: She is not in acute distress.    Appearance: Normal appearance. She is underweight. She is not ill-appearing or diaphoretic.  HENT:  Head: Normocephalic.  Eyes:     General: No scleral icterus.       Right eye: No discharge.        Left eye: No discharge.     Conjunctiva/sclera: Conjunctivae normal.     Pupils: Pupils are equal, round, and reactive to light.  Neck:     Vascular: No carotid bruit.  Cardiovascular:     Rate and Rhythm: Normal rate and regular rhythm.     Pulses: Normal pulses.     Heart sounds: Normal heart sounds. No murmur heard.    No friction rub. No gallop.  Pulmonary:     Effort: Pulmonary effort is normal. No respiratory distress.     Breath sounds: Normal breath sounds. No wheezing, rhonchi or rales.  Chest:     Chest wall: No tenderness.  Abdominal:     General: Bowel sounds are normal. There is no distension.     Palpations: Abdomen is soft. There is no mass.     Tenderness: There is no abdominal tenderness. There is no right CVA tenderness, left CVA tenderness, guarding or rebound.  Musculoskeletal:        General: No swelling or tenderness. Normal range of motion.     Cervical back: Normal range of motion. No rigidity or tenderness.     Lumbar back: No swelling, edema, spasms or tenderness.  Normal range of motion. Negative right straight leg raise test and negative left straight leg raise test.     Right lower leg: No edema.     Left lower leg: No edema.     Comments: Unsteady gait on office wheelchair during visit but walks with walk at home   Lymphadenopathy:     Cervical: No cervical adenopathy.  Skin:    General: Skin is warm and dry.     Coloration: Skin is not pale.     Findings: No bruising, erythema, lesion or rash.  Neurological:     Mental Status: She is alert. Mental status is at baseline.     Cranial Nerves: No cranial nerve deficit.     Sensory: No sensory deficit.     Motor: No weakness.     Coordination: Coordination normal.     Gait: Gait abnormal.  Psychiatric:        Mood and Affect: Mood normal.        Speech: Speech normal.        Behavior: Behavior normal.     Labs reviewed: Recent Labs    07/15/21 0152 12/14/21 1425 01/26/22 1130  NA 134* 132* 129*  K 3.5 4.9 4.8  CL 104 97* 97*  CO2 21* 26 21*  GLUCOSE 95 148* 126*  BUN '12 19 15  '$ CREATININE 0.79 1.03* 0.93  CALCIUM 8.8* 9.7 9.3   Recent Labs    07/13/21 1315 01/26/22 1130  AST 44* 24  ALT 18 10  ALKPHOS 42 49  BILITOT 0.7 0.6  PROT 6.2* 7.0  ALBUMIN 3.6 3.8   Recent Labs    07/13/21 1315 12/14/21 1425 01/26/22 1130  WBC 9.5 11.1* 14.9*  NEUTROABS 4.9  --  9.4*  HGB 15.0 14.5 14.6  HCT 44.4 41.1 44.4  MCV 92.3 89.5 91.4  PLT 233 249 260   Lab Results  Component Value Date   TSH 3.39 06/03/2021   Lab Results  Component Value Date   HGBA1C 5.4 07/13/2021   Lab Results  Component Value Date   CHOL 156 07/13/2021   HDL 62  07/13/2021   LDLCALC 69 07/13/2021   TRIG 123 07/13/2021   CHOLHDL 2.5 07/13/2021    Significant Diagnostic Results in last 30 days:  CT ABDOMEN PELVIS W CONTRAST  Result Date: 01/26/2022 CLINICAL DATA:  Diarrhea, lower back pain EXAM: CT ABDOMEN AND PELVIS WITH CONTRAST TECHNIQUE: Multidetector CT imaging of the abdomen and pelvis was  performed using the standard protocol following bolus administration of intravenous contrast. RADIATION DOSE REDUCTION: This exam was performed according to the departmental dose-optimization program which includes automated exposure control, adjustment of the mA and/or kV according to patient size and/or use of iterative reconstruction technique. CONTRAST:  51m OMNIPAQUE IOHEXOL 300 MG/ML  SOLN COMPARISON:  August 21, 2020. FINDINGS: Lower chest: Minimal bibasilar subsegmental atelectasis or scarring. Hepatobiliary: No focal liver abnormality is seen. Status post cholecystectomy. No biliary dilatation. Pancreas: Unremarkable. No pancreatic ductal dilatation or surrounding inflammatory changes. Spleen: Normal in size without focal abnormality. Adrenals/Urinary Tract: Adrenal glands and kidneys are unremarkable. Urinary bladder is decompressed. Stomach/Bowel: The stomach appears normal. There is no evidence of bowel obstruction or inflammation. Sigmoid diverticulosis is noted. Status post appendectomy. Vascular/Lymphatic: Aortic atherosclerosis. No enlarged abdominal or pelvic lymph nodes. Reproductive: Uterus and bilateral adnexa are unremarkable. Other: No abdominal wall hernia or abnormality. No abdominopelvic ascites. Musculoskeletal: No acute or significant osseous findings. IMPRESSION: Sigmoid diverticulosis without inflammation. No acute abnormality seen in the abdomen or pelvis. Aortic Atherosclerosis (ICD10-I70.0). Electronically Signed   By: JMarijo ConceptionM.D.   On: 01/26/2022 15:28    Assessment/Plan  Chronic bilateral low back pain without sciatica Negative Straight leg Raise.strength equal bilateral.start on low dose of Gabapentin for pain unable to take NSAID's currently on Plavix risk for bleeding. -will refer to Orthopedic for further evaluation of pain.No symptoms of UTI previous U/A was negative. - gabapentin (NEURONTIN) 100 MG capsule; Take 1 capsule (100 mg total) by mouth daily as needed.   Dispense: 30 capsule; Refill: 1 - Ambulatory referral to Orthopedic Surgery - advised to notify provider or go to  if symptoms worsen.  Family/ staff Communication: Reviewed plan of care with patient and care giver verbalized understanding   Labs/tests ordered: None   Next Appointment:Return if symptoms worsen or fail to improve.    DSandrea Hughs NP

## 2022-02-22 ENCOUNTER — Ambulatory Visit (INDEPENDENT_AMBULATORY_CARE_PROVIDER_SITE_OTHER): Payer: Medicare HMO

## 2022-02-22 ENCOUNTER — Encounter: Payer: Self-pay | Admitting: Orthopedic Surgery

## 2022-02-22 ENCOUNTER — Ambulatory Visit (INDEPENDENT_AMBULATORY_CARE_PROVIDER_SITE_OTHER): Payer: Medicare HMO | Admitting: Orthopedic Surgery

## 2022-02-22 VITALS — BP 126/83 | HR 70 | Ht 64.0 in | Wt 74.0 lb

## 2022-02-22 DIAGNOSIS — G8929 Other chronic pain: Secondary | ICD-10-CM

## 2022-02-22 DIAGNOSIS — M545 Low back pain, unspecified: Secondary | ICD-10-CM

## 2022-02-22 MED ORDER — METHOCARBAMOL 500 MG PO TABS: 500.0000 mg | ORAL_TABLET | Freq: Four times a day (QID) | ORAL | 0 refills | Status: DC

## 2022-02-22 MED ORDER — NAPROXEN 500 MG PO TABS: 250.0000 mg | ORAL_TABLET | Freq: Two times a day (BID) | ORAL | 0 refills | Status: DC

## 2022-02-22 NOTE — Progress Notes (Signed)
Orthopedic Spine Surgery Office Note  Assessment: Patient is a 89 y.o. female with low back pain and left rib pain.  No radicular symptoms   Plan: -Explained that initially conservative treatment is tried as a significant number of patients may experience relief with these treatment modalities. Discussed that the conservative treatments include:  -activity modification  -physical therapy  -over the counter pain medications  -medrol dosepak  -steroid injections -Patient has tried lidocaine patch, gabapentin, activity modification -Recommended stop the gabapentin.  Try Tylenol 1000 mg 3 times daily, Robaxin, Voltaren gel over the area, heating pad, naproxen 250 mg twice daily.  Prescriptions were provided for Robaxin and naproxen -Steroid injection was performed today in the office (see note below) -Patient is not doing any better at her next visit, will recommend MRI of the lumbar spine -Patient should return to office in 6 weeks, x-rays at next visit: None   Patient expressed understanding of the plan and all questions were answered to the patient's satisfaction.   Intramuscular steroid injection note: After discussing the risk, benefits, alternatives of a intramuscular steroid injection, patient elected to proceed.  The area over the left gluteus muscle was prepped with alcohol based prep.  The area was anesthetized with ethyl chloride.  A 20-gauge needle was used to inject 40 mg of methylprednisolone under standard sterile technique.  The needle was withdrawn.  A Band-Aid was applied.  Patient tolerated the procedure well.  ___________________________________________________________________________   History:  Patient is a 89 y.o. female who presents today for lumbar.  Patient has had 1 month of low back pain.  She states it started around Christmas time.  She does not recall any trauma or injury that brought on the pain.  Pain is been felt in the lumbar region on both the right and  left paraspinal regions.  Within the last 3 days, she has noticed pain on her left flank around the ribs.  It does not radiate anteriorly along the chest wall.  She has no pain radiating into either of her lower extremities.  Pain is worse if she is standing or walking.  She still is able to walk with her walker.  She does get relief if she sits or lays down.  Denies paresthesias and numbness.  No recent changes in bowel or bladder habits   Weakness: denies Symptoms of imbalance: yes, chronic with no recent changes. Has been using a walker Paresthesias and numbness: denies Saddle anesthesia: denies  Treatments tried: Gabapentin, activity modification, lidocaine patch  Review of systems: Denies fevers and chills, night sweats, unexplained weight loss, history of cancer, pain that wakes them at night  Past medical history: Osteoarthritis GERD Hypertension Hypothyroidism Left bundle branch block Osteoporosis Atrial fibrillation  Allergies: rofecoxib, Bactrim, codeine, estradiol, flurbiprofen, ibuprofen  Past surgical history:  Appendectomy Cholecystectomy Tonsillectomy and adenoidectomy  Social history: Denies use of nicotine product (smoking, vaping, patches, smokeless) Alcohol use: denies Denies recreational drug use   Physical Exam:  General: no acute distress, appears stated age, thin body habitus, limited muscle mass Neurologic: alert, answering questions appropriately, following commands Respiratory: unlabored breathing on room air, symmetric chest rise Psychiatric: appropriate affect, normal cadence to speech   MSK (spine):  -Strength exam      Left  Right EHL    4/5  4/5 TA    4/5  4/5 GSC    4/5  4/5 Knee extension  4/5  4/5 Hip flexion   4/5  4/5  -Sensory exam  Sensation intact to light touch in L3-S1 nerve distributions of bilateral lower extremities  -Straight leg raise: negative -Contralateral straight leg raise: negative -Clonus: no beats  bilaterally  -Left hip exam: no pain through range of motion, negative stinchfield -Right hip exam: no pain through range of motion, negative stinchfield  Imaging: XR of the lumbar spine from 02/22/2022 was independently reviewed and interpreted, showing no fracture or dislocation. Disc heights well maintained. No evidence of instability on flexion/extension views.   CT of the ab/pelvis spine from 01/26/2022 was independently reviewed and interpreted, showing no fracture in the lumbar spine. No hip fracture seen.    Patient name: Gina Terry Patient MRN: [id number] Date of visit: 02/22/22

## 2022-02-26 ENCOUNTER — Encounter (HOSPITAL_COMMUNITY): Payer: Self-pay

## 2022-02-26 ENCOUNTER — Emergency Department (HOSPITAL_COMMUNITY): Payer: Medicare HMO

## 2022-02-26 ENCOUNTER — Observation Stay (HOSPITAL_COMMUNITY)
Admission: EM | Admit: 2022-02-26 | Discharge: 2022-02-27 | Disposition: A | Discharge status: Home / Self Care | Payer: Medicare HMO | Attending: Internal Medicine | Admitting: Internal Medicine

## 2022-02-26 ENCOUNTER — Other Ambulatory Visit: Payer: Self-pay

## 2022-02-26 DIAGNOSIS — E871 Hypo-osmolality and hyponatremia: Secondary | ICD-10-CM | POA: Diagnosis not present

## 2022-02-26 DIAGNOSIS — E039 Hypothyroidism, unspecified: Secondary | ICD-10-CM | POA: Diagnosis present

## 2022-02-26 DIAGNOSIS — I480 Paroxysmal atrial fibrillation: Secondary | ICD-10-CM | POA: Diagnosis not present

## 2022-02-26 DIAGNOSIS — Z87891 Personal history of nicotine dependence: Secondary | ICD-10-CM | POA: Diagnosis not present

## 2022-02-26 DIAGNOSIS — I10 Essential (primary) hypertension: Secondary | ICD-10-CM | POA: Diagnosis not present

## 2022-02-26 DIAGNOSIS — Z79899 Other long term (current) drug therapy: Secondary | ICD-10-CM | POA: Insufficient documentation

## 2022-02-26 DIAGNOSIS — Z7902 Long term (current) use of antithrombotics/antiplatelets: Secondary | ICD-10-CM | POA: Diagnosis not present

## 2022-02-26 DIAGNOSIS — Z8673 Personal history of transient ischemic attack (TIA), and cerebral infarction without residual deficits: Secondary | ICD-10-CM | POA: Diagnosis not present

## 2022-02-26 DIAGNOSIS — R109 Unspecified abdominal pain: Secondary | ICD-10-CM | POA: Diagnosis present

## 2022-02-26 DIAGNOSIS — K2970 Gastritis, unspecified, without bleeding: Secondary | ICD-10-CM | POA: Diagnosis present

## 2022-02-26 DIAGNOSIS — D500 Iron deficiency anemia secondary to blood loss (chronic): Secondary | ICD-10-CM | POA: Insufficient documentation

## 2022-02-26 DIAGNOSIS — F0390 Unspecified dementia without behavioral disturbance: Secondary | ICD-10-CM | POA: Diagnosis not present

## 2022-02-26 DIAGNOSIS — K2901 Acute gastritis with bleeding: Secondary | ICD-10-CM | POA: Diagnosis not present

## 2022-02-26 DIAGNOSIS — D72829 Elevated white blood cell count, unspecified: Secondary | ICD-10-CM | POA: Insufficient documentation

## 2022-02-26 DIAGNOSIS — K922 Gastrointestinal hemorrhage, unspecified: Secondary | ICD-10-CM | POA: Diagnosis not present

## 2022-02-26 HISTORY — DX: Atherosclerosis of aorta: I70.0

## 2022-02-26 HISTORY — DX: Noninfective gastroenteritis and colitis, unspecified: K52.9

## 2022-02-26 HISTORY — DX: Other chronic pain: G89.29

## 2022-02-26 HISTORY — DX: Unspecified dementia, unspecified severity, without behavioral disturbance, psychotic disturbance, mood disturbance, and anxiety: F03.90

## 2022-02-26 HISTORY — DX: Low back pain, unspecified: M54.50

## 2022-02-26 LAB — COMPREHENSIVE METABOLIC PANEL
ALT: 11 U/L (ref 0–44)
AST: 17 U/L (ref 15–41)
Albumin: 3.1 g/dL — ABNORMAL LOW (ref 3.5–5.0)
Alkaline Phosphatase: 37 U/L — ABNORMAL LOW (ref 38–126)
Anion gap: 7 (ref 5–15)
BUN: 53 mg/dL — ABNORMAL HIGH (ref 8–23)
CO2: 22 mmol/L (ref 22–32)
Calcium: 8.7 mg/dL — ABNORMAL LOW (ref 8.9–10.3)
Chloride: 101 mmol/L (ref 98–111)
Creatinine, Ser: 0.73 mg/dL (ref 0.44–1.00)
GFR, Estimated: >60 mL/min (ref >60)
Glucose, Bld: 111 mg/dL — ABNORMAL HIGH (ref 70–99)
Potassium: 4.7 mmol/L (ref 3.5–5.1)
Sodium: 130 mmol/L — ABNORMAL LOW (ref 135–145)
Total Bilirubin: 0.4 mg/dL (ref 0.3–1.2)
Total Protein: 5.6 g/dL — ABNORMAL LOW (ref 6.5–8.1)

## 2022-02-26 LAB — HEMOGLOBIN AND HEMATOCRIT, BLOOD
HCT: 31.8 % — ABNORMAL LOW (ref 36.0–46.0)
HCT: 33.2 % — ABNORMAL LOW (ref 36.0–46.0)
Hemoglobin: 10.6 g/dL — ABNORMAL LOW (ref 12.0–15.0)
Hemoglobin: 10.8 g/dL — ABNORMAL LOW (ref 12.0–15.0)

## 2022-02-26 LAB — URINALYSIS, W/ REFLEX TO CULTURE (INFECTION SUSPECTED)
Bacteria, UA: NONE SEEN
Bilirubin Urine: NEGATIVE
Glucose, UA: NEGATIVE mg/dL
Ketones, ur: NEGATIVE mg/dL
Leukocytes,Ua: NEGATIVE
Nitrite: NEGATIVE
Protein, ur: NEGATIVE mg/dL
Specific Gravity, Urine: 1.024 (ref 1.005–1.030)
pH: 5 (ref 5.0–8.0)

## 2022-02-26 LAB — CBC WITH DIFFERENTIAL/PLATELET
Abs Immature Granulocytes: 0.2 10*3/uL — ABNORMAL HIGH (ref 0.00–0.07)
Basophils Absolute: 0 10*3/uL (ref 0.0–0.1)
Basophils Relative: 0 %
Eosinophils Absolute: 0 10*3/uL (ref 0.0–0.5)
Eosinophils Relative: 0 %
HCT: 31.8 % — ABNORMAL LOW (ref 36.0–46.0)
Hemoglobin: 10.6 g/dL — ABNORMAL LOW (ref 12.0–15.0)
Lymphocytes Relative: 6 %
Lymphs Abs: 1 10*3/uL (ref 0.7–4.0)
MCH: 30.7 pg (ref 26.0–34.0)
MCHC: 33.3 g/dL (ref 30.0–36.0)
MCV: 92.2 fL (ref 80.0–100.0)
Monocytes Absolute: 2.8 10*3/uL — ABNORMAL HIGH (ref 0.1–1.0)
Monocytes Relative: 17 %
Myelocytes: 1 %
Neutro Abs: 12.4 10*3/uL — ABNORMAL HIGH (ref 1.7–7.7)
Neutrophils Relative %: 76 %
Platelets: 197 10*3/uL (ref 150–400)
RBC: 3.45 MIL/uL — ABNORMAL LOW (ref 3.87–5.11)
RDW: 14.7 % (ref 11.5–15.5)
WBC: 16.3 10*3/uL — ABNORMAL HIGH (ref 4.0–10.5)
nRBC: 0 % (ref 0.0–0.2)

## 2022-02-26 LAB — POC OCCULT BLOOD, ED: Fecal Occult Blood: POSITIVE

## 2022-02-26 LAB — LACTIC ACID, PLASMA: Lactic Acid, Venous: 1.3 mmol/L (ref 0.5–1.9)

## 2022-02-26 LAB — LIPASE, BLOOD: Lipase: 56 U/L — ABNORMAL HIGH (ref 11–51)

## 2022-02-26 MED ORDER — ONDANSETRON HCL 4 MG/2ML IJ SOLN: 4.0000 mg | Freq: Four times a day (QID) | INTRAMUSCULAR | Status: DC | PRN

## 2022-02-26 MED ORDER — AMLODIPINE BESYLATE 5 MG PO TABS
2.5000 mg | ORAL_TABLET | Freq: Every day | ORAL | Status: DC
  Administered 2022-02-26 – 2022-02-27 (×2): 2.5 mg via ORAL
  Filled 2022-02-26 (×2): qty 1

## 2022-02-26 MED ORDER — IOHEXOL 300 MG/ML  SOLN
74.0000 mL | Freq: Once | INTRAMUSCULAR | Status: AC | PRN
  Administered 2022-02-26: 74 mL via INTRAVENOUS

## 2022-02-26 MED ORDER — SODIUM CHLORIDE 0.9 % IV SOLN: INTRAVENOUS | Status: DC

## 2022-02-26 MED ORDER — IOHEXOL 300 MG/ML  SOLN: 100.0000 mL | Freq: Once | INTRAMUSCULAR | Status: DC | PRN

## 2022-02-26 MED ORDER — PANTOPRAZOLE SODIUM 40 MG IV SOLR
40.0000 mg | Freq: Two times a day (BID) | INTRAVENOUS | Status: DC
  Administered 2022-02-26 – 2022-02-27 (×3): 40 mg via INTRAVENOUS
  Filled 2022-02-26 (×3): qty 10

## 2022-02-26 MED ORDER — ACETAMINOPHEN 325 MG PO TABS
650.0000 mg | ORAL_TABLET | Freq: Four times a day (QID) | ORAL | Status: DC | PRN
  Administered 2022-02-26 (×2): 650 mg via ORAL
  Filled 2022-02-26 (×2): qty 2

## 2022-02-26 MED ORDER — METOPROLOL SUCCINATE ER 25 MG PO TB24
25.0000 mg | ORAL_TABLET | Freq: Every day | ORAL | Status: DC
  Administered 2022-02-26 – 2022-02-27 (×2): 25 mg via ORAL
  Filled 2022-02-26 (×2): qty 1

## 2022-02-26 MED ORDER — MELATONIN 3 MG PO TABS
3.0000 mg | ORAL_TABLET | Freq: Every day | ORAL | Status: DC
  Administered 2022-02-26: 3 mg via ORAL
  Filled 2022-02-26: qty 1

## 2022-02-26 MED ORDER — PANTOPRAZOLE SODIUM 40 MG IV SOLR
40.0000 mg | Freq: Once | INTRAVENOUS | Status: AC
  Administered 2022-02-26: 40 mg via INTRAVENOUS
  Filled 2022-02-26: qty 10

## 2022-02-26 MED ORDER — SENNOSIDES-DOCUSATE SODIUM 8.6-50 MG PO TABS: 1.0000 | ORAL_TABLET | Freq: Every evening | ORAL | Status: DC | PRN

## 2022-02-26 MED ORDER — ONDANSETRON HCL 4 MG PO TABS: 4.0000 mg | ORAL_TABLET | Freq: Four times a day (QID) | ORAL | Status: DC | PRN

## 2022-02-26 MED ORDER — ACETAMINOPHEN 650 MG RE SUPP: 650.0000 mg | Freq: Four times a day (QID) | RECTAL | Status: DC | PRN

## 2022-02-26 MED ORDER — LEVOTHYROXINE SODIUM 25 MCG PO TABS
50.0000 ug | ORAL_TABLET | Freq: Every day | ORAL | Status: DC
  Administered 2022-02-26: 50 ug via ORAL
  Filled 2022-02-26: qty 1
  Filled 2022-02-26: qty 2

## 2022-02-26 NOTE — ED Notes (Addendum)
Was able to return call to pt's daughter (DPR on file with approval to consult pt medical record) daughter Opal Sidles stated that pt has 2 broken ribs possibly as shown on her xray taken a few days ago by pt's ortho provider, pt has hx of collitis and unsure is that is cuasing her belly pain or from starting new meds Naproxen and Robaxin. Also received 40 mg of methylprednisolone injection by ortho as well on 02/22/2022  Daughter will call back after while for an update of pt's status

## 2022-02-26 NOTE — ED Notes (Signed)
Pt placed on 2L [place], sats intermittently drops to 89% on RA then will increase back to 92% on RA.

## 2022-02-26 NOTE — ED Provider Notes (Signed)
[facility] Provider Note  CSN: [id number] Arrival date & time: 02/26/22 0017  Chief Complaint(s) Abdominal Pain  Pt from home BIB [facility], initially called out for abd pain, when EMS arrived they asked her about her belly pain, pt reports "I don't know". Pt was slightly combative in route, but appears calmer on arrival. 90% on RA, EMS placed pt on 2L [place], EMS reports AMS, but believes pt is alter at her baseline. 92% on RA on arrival. Pt repeats "I don't care, you don't care". Hx of Afib and LBBB. Pt alert.   HPI Gina Terry is a 89 y.o. female with a past medical history listed below including dementia, paroxysmal A-fib not on anticoagulation, TIA, colitis who presents to the emergency department for abdominal discomfort noted tonight by patient's 24-hour caregiver.  Patient does not remember and is not complaining of any abdominal pain currently. At her baseline mental status per care giver.  Spoke to daughter over the phone who also reported patient was recently started on naproxen and Robaxin for lower back pain.  On review of records, patient has a history of gastritis related to NSAIDs noted on EGD and 2009  Abdominal Pain   Past Medical History Past Medical History:  Diagnosis Date   Aortic atherosclerosis ([place])    Arthritis    Chronic bilateral low back pain    Colitis    Dementia ([place])    GERD (gastroesophageal reflux disease)    History of bone density study    History of mammogram    Hypertension, essential 04/19/2016   Hypothyroid    Left bundle branch block    Osteoporosis    Paroxysmal atrial fibrillation [facility])    Patient Active Problem List   Diagnosis Date Noted   UGIB (upper gastrointestinal bleed) 02/26/2022   Dementia without behavioral disturbance ([place]) 02/26/2022   Hyponatremia 02/26/2022   Leukocytosis 02/26/2022   TIA (transient ischemic attack) 07/13/2021   Hyperkalemia 07/13/2021   Insomnia associated  with menopause 02/23/2021   Gastritis 02/04/2019   Colitis 01/25/2019   Weight loss, unintentional 09/23/2018   Chronic bilateral low back pain 04/19/2016   Gastroesophageal reflux 04/19/2016   Hypertension, essential 04/19/2016   Osteoporosis, 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/19/2016   Primary hypothyroidism 04/19/2016   Paroxysmal atrial fibrillation ([place])    Left bundle branch block    OTHER CONSTIPATION 07/22/2007   ABDOMINAL PAIN, EPIGASTRIC 07/22/2007   GASTRITIS, CHRONIC 05/14/2007   DIVERTICULOSIS, COLON 05/14/2007   Home Medication(s) Prior to Admission medications   Medication Sig Start Date End Date Taking? Authorizing Provider  acetaminophen (TYLENOL) 500 MG tablet Take 500 mg by mouth every 6 (six) hours as needed for mild pain.    [provider]  amLODipine (NORVASC) 2.5 MG tablet TAKE 1 TABLET BY MOUTH EVERY DAY 12/01/21   [place], Peter M, MD  calcium carbonate (TUMS - DOSED IN MG ELEMENTAL CALCIUM) 500 MG chewable tablet Chew 3 tablets by mouth daily as needed for indigestion or heartburn.    [provider]  clopidogrel (PLAVIX) 75 MG tablet TAKE 1 TABLET BY MOUTH EVERY DAY 12/01/21   [place], Peter M, MD  gabapentin (NEURONTIN) 100 MG capsule Take 1 capsule (100 mg total) by mouth daily as needed. 02/14/22   Ngetich, Nelda Bucks, NP  levothyroxine (SYNTHROID) 50 MCG tablet Take 1 tablet (50 mcg total) by mouth daily before breakfast. 12/01/21   Wardell Honour, MD  lidocaine (LIDODERM) 5 % Place 1 patch onto  the skin daily. Remove & Discard patch within 12 hours or as directed by MD 01/26/22   Fransico Meadow, MD  loperamide (IMODIUM A-D) 2 MG capsule Take 1 capsule (2 mg total) by mouth as needed for diarrhea or loose stools. 08/27/20   Ngetich, Dinah C, NP  methocarbamol (ROBAXIN) 500 MG tablet Take 1 tablet (500 mg total) by mouth 4 (four) times daily. 02/22/22 04/05/22  Callie Fielding, MD  metoprolol succinate (TOPROL-XL) 25 MG 24 hr tablet Take 1 tablet (25 mg total)  by mouth daily. 10/28/21   [place], Peter M, MD  naproxen (NAPROSYN) 500 MG tablet Take 0.5 tablets (250 mg total) by mouth 2 (two) times daily with a meal. 02/22/22   Callie Fielding, MD  nitroGLYCERIN (NITROSTAT) 0.4 MG SL tablet Place 1 tablet (0.4 mg total) under the tongue every 5 (five) minutes as needed for chest pain. Patient not taking: Reported on 02/14/2022 02/19/19 01/31/48  [place], Peter M, MD  polyethylene glycol (MIRALAX / GLYCOLAX) 17 g packet Take 17 g by mouth daily as needed for mild constipation or moderate constipation.    [provider]  zolpidem (AMBIEN) 5 MG tablet TAKE 1 TABLET BY MOUTH EVERYDAY AT BEDTIME 01/31/22   Wardell Honour, MD                                                                                                                                    Allergies Rofecoxib, Sulfamethoxazole-trimethoprim, Codeine, Estradiol, Flurbiprofen, and Ibuprofen  Review of Systems Review of Systems  Gastrointestinal:  Positive for abdominal pain.   As noted in HPI  Physical Exam Vital Signs  I have reviewed the triage vital signs BP 128/63   Pulse 61   Temp 97.8 F (36.6 C) (Oral)   Resp 19   SpO2 92%   Physical Exam Vitals reviewed.  Constitutional:      General: She is not in acute distress.    Appearance: She is well-developed. She is not diaphoretic.  HENT:     Head: Normocephalic and atraumatic.     Right Ear: External ear normal.     Left Ear: External ear normal.     Nose: Nose normal.  Eyes:     General: No scleral icterus.    Conjunctiva/sclera: Conjunctivae normal.  Neck:     Trachea: Phonation normal.  Cardiovascular:     Rate and Rhythm: Normal rate and regular rhythm.  Pulmonary:     Effort: Pulmonary effort is normal. No respiratory distress.     Breath sounds: No stridor.  Abdominal:     General: There is no distension.     Tenderness: There is abdominal tenderness in the right lower quadrant and periumbilical area. There is  no guarding or rebound.  Musculoskeletal:        General: Normal range of motion.     Cervical back: Normal range of motion.  Neurological:  Mental Status: She is alert and oriented to person, place, and time.  Psychiatric:        Behavior: Behavior normal.     ED Results and Treatments Labs (all labs ordered are listed, but only abnormal results are displayed) Labs Reviewed  COMPREHENSIVE METABOLIC PANEL - Abnormal; Notable for the following components:      Result Value   Sodium 130 (*)    Glucose, Bld 111 (*)    BUN 53 (*)    Calcium 8.7 (*)    Total Protein 5.6 (*)    Albumin 3.1 (*)    Alkaline Phosphatase 37 (*)    All other components within normal limits  LIPASE, BLOOD - Abnormal; Notable for the following components:   Lipase 56 (*)    All other components within normal limits  CBC WITH DIFFERENTIAL/PLATELET - Abnormal; Notable for the following components:   WBC 16.3 (*)    RBC 3.45 (*)    Hemoglobin 10.6 (*)    HCT 31.8 (*)    Neutro Abs 12.4 (*)    Monocytes Absolute 2.8 (*)    Abs Immature Granulocytes 0.20 (*)    All other components within normal limits  LACTIC ACID, PLASMA  URINALYSIS, W/ REFLEX TO CULTURE (INFECTION SUSPECTED)  URINALYSIS, ROUTINE W REFLEX MICROSCOPIC  HEMOGLOBIN AND HEMATOCRIT, BLOOD  HEMOGLOBIN AND HEMATOCRIT, BLOOD  HEMOGLOBIN AND HEMATOCRIT, BLOOD  POC OCCULT BLOOD, ED                                                                                                                         EKG  EKG Interpretation  Date/Time:    Ventricular Rate:    PR Interval:    QRS Duration:   QT Interval:    QTC Calculation:   R Axis:     Text Interpretation:         Radiology CT ABDOMEN PELVIS W CONTRAST  Result Date: 02/26/2022 CLINICAL DATA:  Right lower quadrant pain.  History of colitis. EXAM: CT ABDOMEN AND PELVIS WITH CONTRAST TECHNIQUE: Multidetector CT imaging of the abdomen and pelvis was performed using the standard  protocol following bolus administration of intravenous contrast. RADIATION DOSE REDUCTION: This exam was performed according to the departmental dose-optimization program which includes automated exposure control, adjustment of the mA and/or kV according to patient size and/or use of iterative reconstruction technique. CONTRAST:  <See Chart> OMNIPAQUE IOHEXOL 300 MG/ML SOLN, 32m OMNIPAQUE IOHEXOL 300 MG/ML SOLN COMPARISON:  CT 01/26/2022 FINDINGS: Lower chest: Advanced emphysema. Hepatobiliary: No focal liver abnormality is seen. Status post cholecystectomy. No biliary dilatation. Pancreas: Unremarkable. Spleen: Unremarkable. Adrenals/Urinary Tract: Normal adrenal glands. Bilateral cortical renal scarring. No solid renal lesion. No definite urinary calculi. Unremarkable bladder. Stomach/Bowel: Extensive colonic diverticula. No definite diverticulitis. Wall thickening of the gastric antrum. The appendix is not definitively visualized. Vascular/Lymphatic: Aortic atherosclerosis. No enlarged abdominal or pelvic lymph nodes. Reproductive: Uterus and bilateral adnexa are unremarkable. Other: No free intraperitoneal air. Musculoskeletal: Demineralization. No  acute fracture. Sclerosis in both femoral head suspicious for AVN. IMPRESSION: Wall thickening of the gastric antrum suggestive of gastritis. Consider correlation with endoscopy. Colonic diverticulosis without diverticulitis. Aortic Atherosclerosis (ICD10-I70.0). Electronically Signed   By: Placido Sou M.D.   On: 02/26/2022 03:39    Medications Ordered in ED Medications  0.9 %  sodium chloride infusion ( Intravenous New Bag/Given 02/26/22 0201)  pantoprazole (PROTONIX) injection 40 mg (has no administration in time range)  acetaminophen (TYLENOL) tablet 650 mg (has no administration in time range)    Or  acetaminophen (TYLENOL) suppository 650 mg (has no administration in time range)  senna-docusate (Senokot-S) tablet 1 tablet (has no administration in  time range)  ondansetron (ZOFRAN) tablet 4 mg (has no administration in time range)    Or  ondansetron (ZOFRAN) injection 4 mg (has no administration in time range)  levothyroxine (SYNTHROID) tablet 50 mcg (has no administration in time range)  amLODipine (NORVASC) tablet 2.5 mg (has no administration in time range)  metoprolol succinate (TOPROL-XL) 24 hr tablet 25 mg (has no administration in time range)  melatonin tablet 3 mg (has no administration in time range)  iohexol (OMNIPAQUE) 300 MG/ML solution 74 mL (74 mLs Intravenous Contrast Given 02/26/22 0310)  pantoprazole (PROTONIX) injection 40 mg (40 mg Intravenous Given 02/26/22 0452)                                                                                                                                     Procedures Procedures  (including critical care time)  Medical Decision Making / ED Course   Medical Decision Making Amount and/or Complexity of Data Reviewed Independent Historian: caregiver Labs: ordered. Decision-making details documented in ED Course. Radiology: ordered and independent interpretation performed. Decision-making details documented in ED Course.  Risk Prescription drug management. Decision regarding hospitalization.    Abdominal pain with tenderness.  Differential includes but not limited to gastritis, PUD, pancreatitis, biliary disease, colitis or other intra-abdominal inflammatory/infectious process, mesenteric ischemia, urinary tract infection  CBC notable for leukocytosis and anemia with a hemoglobin of 10.6 which is a 4 g drop in 1 month. Metabolic panel with mild hyponatremia without other electrolyte derangements or renal sufficiency.  BUN elevated. Hemoccult positive Lactic acid normal  CT scan notable for evidence of gastritis.  No other intra-abdominal inflammatory/infectious process or bowel obstruction noted.  Clinical picture favors upper GI bleed likely from gastritis secondary to  NSAID use.  I called and updated daughter.  Patient will be admitted to the hospital under hospitalist service for further workup and management.  Protonix given.      Final Clinical Impression(s) / ED Diagnoses Final diagnoses:  Other acute gastritis with hemorrhage  Upper GI bleed  Blood loss anemia           This chart was dictated using voice recognition software.  Despite best efforts to proofread,  errors can occur which can change the  documentation meaning.    Fatima Blank, MD 02/26/22 [phone]

## 2022-02-26 NOTE — Progress Notes (Signed)
Brief same day note:  Patient is a 89 year old female with history of hypertension, paroxysmal A-fib not on anticoagulation, left bundle branch block, dementia, hypothyroidism who lives independently on supervision of 24 hr caretaker and uses walker for ambulation presented with complaint of abdominal pain.  She was taking naproxen for back pain.  In the emergency department she was found to have a hemoglobin of 10.6, last Hb on December was 14.6.  FOBT positive.  CT abdomen/pelvis showed wall thickening of the gastric antrum suggestive  of gastritis.  After discussion with family, they donot wish any kind of intervention including EGD.  Plan for conservative management with Protonix.  Assessment and plan:  Upper GI bleed: Presented with abdominal pain.  Was taking naproxen for back pain.  Hemoglobin found to have dropped to the range of 10 from 14, suspicious for upper GI bleed.  FOBT positive.  After discussion with family, they are not interested on kind of intervention including EGD.  Plan for conservative management with Protonix.  Continue Protonix IV.  Avoid NSAIDs, anticoagulants. No report of hematochezia or melena.  Remains comfortable today  Hypertension: Continue Norvasc, metoprolol  Paroxysmal A-fib: Currently in normal sinus rhythm.  Continue metoprolol.  Not on anticoagulation  Hypothyroidism: Continue Synthyroid  Hyponatremia: Likely Chronic.  Continue to monitor  History of TIA: On Plavix at home.  Currently on hold  Leukocytosis: Chronic, no evidence of infection.  UA, chest x-ray checked without any finding of infectious process  Dementia: Continue delirium precaution.  Patient seen and examined at bedside this morning in the emergency department.  Caretaker was at the bedside.  She remained comfortable, hemodynamically stable.  Denies any nausea, vomiting or abdominal pain

## 2022-02-26 NOTE — ED Notes (Signed)
ED TO INPATIENT HANDOFF REPORT  ED Nurse Name and Phone #: Charlean Sanfilippo RN  S Name/Age/Gender Gina Terry 89 y.o. female Room/Bed: WA09/WA09  Code Status   Code Status: DNR  Home/SNF/Other Home Patient oriented to: self, place, time, and situation Is this baseline? Yes   Triage Complete: Triage complete  Chief Complaint UGIB (upper gastrointestinal bleed) [K92.2]  Triage Note Pt from home BIB [facility], initially called out for abd pain, when EMS arrived they asked her about her belly pain, pt reports "I don't know". Pt was slightly combative in route, but appears calmer on arrival. 90% on RA, EMS placed pt on 2L [place], EMS reports AMS, but believes pt is alter at her baseline. 92% on RA on arrival. Pt repeats "I don't care, you don't care". Hx of Afib and LBBB. Pt alert.   PT HAS DNR FORM WITH HER   Allergies Allergies  Allergen Reactions   Rofecoxib     Other reaction(s): Other (See Comments) reflux, edema    Sulfamethoxazole-Trimethoprim Nausea Only   Codeine Other (See Comments)    unknown Other reaction(s): GI Upset (intolerance)   Estradiol Rash    Local rash   Flurbiprofen     Other reaction(s): GI Upset (intolerance)   Ibuprofen     Other reaction(s): GI Upset (intolerance)    Level of Care/Admitting Diagnosis ED Disposition     ED Disposition  Admit   Condition  --   Comment  Hospital Area: [facility] [[id number]]  Level of Care: Med-Surg [16]  May place patient in observation at [facility] or [facility] if equivalent level of care is available:: Yes  Covid Evaluation: Confirmed COVID Negative  Diagnosis: UGIB (upper gastrointestinal bleed) [161096]  Admitting Physician: [place], [place]  Attending Physician: Haywood Pao          B Medical/Surgery History Past Medical History:  Diagnosis Date   Aortic atherosclerosis ([place])    Arthritis    Chronic bilateral low back pain    Colitis    Dementia ([place])    GERD  (gastroesophageal reflux disease)    History of bone density study    History of mammogram    Hypertension, essential 04/19/2016   Hypothyroid    Left bundle branch block    Osteoporosis    Paroxysmal atrial fibrillation ([place])    Past Surgical History:  Procedure Laterality Date   APPENDECTOMY     CARDIOVASCULAR STRESS TEST  03/24/2009   EF 78%   CHOLECYSTECTOMY     COLONOSCOPY     TONSILLECTOMY AND ADENOIDECTOMY     US ECHOCARDIOGRAPHY  05/07/2008   Est EF 50-55%     A IV Location/Drains/Wounds Patient Lines/Drains/Airways Status     Active Line/Drains/Airways     Name Placement date Placement time Site Days   Peripheral IV 02/26/22 20 G Anterior;Right Forearm 02/26/22  0156  Forearm  less than 1   External Urinary Catheter 02/26/22  0246  --  less than 1            Intake/Output Last 24 hours No intake or output data in the 24 hours ending 02/26/22 1428  Labs/Imaging Results for orders placed or performed during the hospital encounter of 02/26/22 (from the past 48 hour(s))  Comprehensive metabolic panel     Status: Abnormal   Collection Time: 02/26/22  1:55 AM  Result Value Ref Range   Sodium 130 (L) 135 - 145 mmol/L   Potassium 4.7 3.5 - 5.1  mmol/L   Chloride 101 98 - 111 mmol/L   CO2 22 22 - 32 mmol/L   Glucose, Bld 111 (H) 70 - 99 mg/dL    Comment: Glucose reference range applies only to samples taken after fasting for at least 8 hours.   BUN 53 (H) 8 - 23 mg/dL   Creatinine, Ser 0.73 0.44 - 1.00 mg/dL   Calcium 8.7 (L) 8.9 - 10.3 mg/dL   Total Protein 5.6 (L) 6.5 - 8.1 g/dL   Albumin 3.1 (L) 3.5 - 5.0 g/dL   AST 17 15 - 41 U/L   ALT 11 0 - 44 U/L   Alkaline Phosphatase 37 (L) 38 - 126 U/L   Total Bilirubin 0.4 0.3 - 1.2 mg/dL   GFR, Estimated >60 >60 mL/min    Comment: (NOTE) Calculated using the CKD-EPI Creatinine Equation (2021)    Anion gap 7 5 - 15    Comment: Performed at [facility], [place] [address], [place], [place]  [address]  Lipase, blood     Status: Abnormal   Collection Time: 02/26/22  1:55 AM  Result Value Ref Range   Lipase 56 (H) 11 - 51 U/L    Comment: Performed at [facility], [place] [address] [place], [place] [address]  CBC with Diff     Status: Abnormal   Collection Time: 02/26/22  1:55 AM  Result Value Ref Range   WBC 16.3 (H) 4.0 - 10.5 K/uL   RBC 3.45 (L) 3.87 - 5.11 MIL/uL   Hemoglobin 10.6 (L) 12.0 - 15.0 g/dL   HCT 31.8 (L) 36.0 - 46.0 %   MCV 92.2 80.0 - 100.0 fL   MCH 30.7 26.0 - 34.0 pg   MCHC 33.3 30.0 - 36.0 g/dL   RDW 14.7 11.5 - 15.5 %   Platelets 197 150 - 400 K/uL   nRBC 0.0 0.0 - 0.2 %   Neutrophils Relative % 76 %   Neutro Abs 12.4 (H) 1.7 - 7.7 K/uL   Lymphocytes Relative 6 %   Lymphs Abs 1.0 0.7 - 4.0 K/uL   Monocytes Relative 17 %   Monocytes Absolute 2.8 (H) 0.1 - 1.0 K/uL   Eosinophils Relative 0 %   Eosinophils Absolute 0.0 0.0 - 0.5 K/uL   Basophils Relative 0 %   Basophils Absolute 0.0 0.0 - 0.1 K/uL   Myelocytes 1 %   Abs Immature Granulocytes 0.20 (H) 0.00 - 0.07 K/uL    Comment: Performed at [facility], [place] [address], [place], [place] [address]  Lactic acid, plasma     Status: None   Collection Time: 02/26/22  2:00 AM  Result Value Ref Range   Lactic Acid, Venous 1.3 0.5 - 1.9 mmol/L    Comment: Performed at [facility], [place] [address] [place], [place] [address]  POC occult blood, ED Provider will collect     Status: None   Collection Time: 02/26/22  3:53 AM  Result Value Ref Range   Fecal Occult Blood Positive   Hemoglobin and hematocrit, blood     Status: Abnormal   Collection Time: 02/26/22  5:15 AM  Result Value Ref Range   Hemoglobin 10.6 (L) 12.0 - 15.0 g/dL   HCT 31.8 (L) 36.0 - 46.0 %    Comment: Performed at [facility], [place] [address] [place], [place] [address]  Urinalysis, w/ Reflex to Culture (Infection Suspected) -Urine, Clean Catch     Status: Abnormal  Collection Time: 02/26/22 12:59 PM  Result Value Ref Range   Specimen Source URINE, CLEAN CATCH    Color, Urine STRAW (A) YELLOW   APPearance CLEAR CLEAR   Specific Gravity, Urine 1.024 1.005 - 1.030   pH 5.0 5.0 - 8.0   Glucose, UA NEGATIVE NEGATIVE mg/dL   Hgb urine dipstick SMALL (A) NEGATIVE   Bilirubin Urine NEGATIVE NEGATIVE   Ketones, ur NEGATIVE NEGATIVE mg/dL   Protein, ur NEGATIVE NEGATIVE mg/dL   Nitrite NEGATIVE NEGATIVE   Leukocytes,Ua NEGATIVE NEGATIVE   RBC / HPF 0-5 0 - 5 RBC/hpf   WBC, UA 0-5 0 - 5 WBC/hpf    Comment:        Reflex urine culture not performed if WBC <=10, OR if Squamous epithelial cells >5. If Squamous epithelial cells >5 suggest recollection.    Bacteria, UA NONE SEEN NONE SEEN   Squamous Epithelial / HPF 0-5 0 - 5 /HPF    Comment: Performed at [facility], [place] [address], [place], [place] [address]   DG CHEST PORT 1 VIEW  Result Date: 02/26/2022 CLINICAL DATA:  89 year old female with abdominal pain, leukocytosis. EXAM: PORTABLE CHEST 1 VIEW COMPARISON:  CT Abdomen and Pelvis 0315 hours today. Portable chest 01/25/2019. FINDINGS: Portable AP semi upright view at 0506 hours. Hyperinflated lung volumes. Bilateral lung base emphysema and scarring on CT Abdomen and Pelvis this morning. Mediastinal contours are stable since 2020, tortuous thoracic aorta. Visualized tracheal air column is within normal limits. No superimposed pneumothorax, pulmonary edema, pleural effusion, or acute pulmonary opacity. Stable visualized osseous structures, including a chronic midthoracic compression fracture which was present in 2020. IMPRESSION: Chronic lung disease with Emphysema (ICD10-J43.9). No acute cardiopulmonary abnormality. Electronically Signed   By: Genevie Ann M.D.   On: 02/26/2022 05:59   CT ABDOMEN PELVIS W CONTRAST  Result Date: 02/26/2022 CLINICAL DATA:  Right lower quadrant pain.  History of colitis. EXAM: CT ABDOMEN AND PELVIS WITH  CONTRAST TECHNIQUE: Multidetector CT imaging of the abdomen and pelvis was performed using the standard protocol following bolus administration of intravenous contrast. RADIATION DOSE REDUCTION: This exam was performed according to the departmental dose-optimization program which includes automated exposure control, adjustment of the mA and/or kV according to patient size and/or use of iterative reconstruction technique. CONTRAST:  <See Chart> OMNIPAQUE IOHEXOL 300 MG/ML SOLN, 36m OMNIPAQUE IOHEXOL 300 MG/ML SOLN COMPARISON:  CT 01/26/2022 FINDINGS: Lower chest: Advanced emphysema. Hepatobiliary: No focal liver abnormality is seen. Status post cholecystectomy. No biliary dilatation. Pancreas: Unremarkable. Spleen: Unremarkable. Adrenals/Urinary Tract: Normal adrenal glands. Bilateral cortical renal scarring. No solid renal lesion. No definite urinary calculi. Unremarkable bladder. Stomach/Bowel: Extensive colonic diverticula. No definite diverticulitis. Wall thickening of the gastric antrum. The appendix is not definitively visualized. Vascular/Lymphatic: Aortic atherosclerosis. No enlarged abdominal or pelvic lymph nodes. Reproductive: Uterus and bilateral adnexa are unremarkable. Other: No free intraperitoneal air. Musculoskeletal: Demineralization. No acute fracture. Sclerosis in both femoral head suspicious for AVN. IMPRESSION: Wall thickening of the gastric antrum suggestive of gastritis. Consider correlation with endoscopy. Colonic diverticulosis without diverticulitis. Aortic Atherosclerosis (ICD10-I70.0). Electronically Signed   By: TPlacido SouM.D.   On: 02/26/2022 03:39    Pending Labs Unresulted Labs (From admission, onward)     Start     Ordered   02/27/22 06283 Basic metabolic panel  Tomorrow morning,   R        02/26/22 0504   02/27/22 0500  Protime-INR  Tomorrow morning,   R  02/26/22 0504   02/27/22 0500  CBC  Tomorrow morning,   R        02/26/22 0839   02/26/22 1500   Hemoglobin and hematocrit, blood  Once,   R        02/26/22 0839   02/26/22 0447  Urinalysis, Routine w reflex microscopic -Urine, Clean Catch  Once,   URGENT       Question:  Specimen Source  Answer:  Urine, Clean Catch   02/26/22 0447            Vitals/Pain Today's Vitals   02/26/22 0630 02/26/22 0700 02/26/22 0924 02/26/22 1258  BP: 126/61 (!) 161/68 (!) 148/68 (!) 157/57  Pulse: 67 68 62 (!) 59  Resp: (!) '22  18 18  '$ Temp:   98 F (36.7 C) 97.8 F (36.6 C)  TempSrc:   Oral Oral  SpO2: 98% 96% 99% 99%  PainSc:        Isolation Precautions No active isolations  Medications Medications  0.9 %  sodium chloride infusion ( Intravenous Rate/Dose Change 02/26/22 1012)  pantoprazole (PROTONIX) injection 40 mg (40 mg Intravenous Given 02/26/22 1023)  acetaminophen (TYLENOL) tablet 650 mg (650 mg Oral Given 02/26/22 1256)    Or  acetaminophen (TYLENOL) suppository 650 mg ( Rectal See Alternative 02/26/22 1256)  senna-docusate (Senokot-S) tablet 1 tablet (has no administration in time range)  ondansetron (ZOFRAN) tablet 4 mg (has no administration in time range)    Or  ondansetron (ZOFRAN) injection 4 mg (has no administration in time range)  levothyroxine (SYNTHROID) tablet 50 mcg (50 mcg Oral Given 02/26/22 0656)  amLODipine (NORVASC) tablet 2.5 mg (2.5 mg Oral Given 02/26/22 1023)  metoprolol succinate (TOPROL-XL) 24 hr tablet 25 mg (25 mg Oral Given 02/26/22 1023)  melatonin tablet 3 mg (has no administration in time range)  iohexol (OMNIPAQUE) 300 MG/ML solution 74 mL (74 mLs Intravenous Contrast Given 02/26/22 0310)  pantoprazole (PROTONIX) injection 40 mg (40 mg Intravenous Given 02/26/22 0452)    Mobility      Focused Assessments     R Recommendations: See Admitting Provider Note  Report given to:   Additional Notes:

## 2022-02-26 NOTE — H&P (Signed)
PCP:   Gina Honour, MD   Chief Complaint:  Abdominal pain  HPI: [name] with PMHx of  hypertension, paroxysmal atrial fibrillation on anticoagulation, LBBB, dementia, and hypothyroidism.  Patient lives independently with 24-hour care.  At baseline she uses a walker.  She presents with complaint of worsening abdominal pain which started around the holidays.  She has a history of GERD.  She recently started taking naproxen for back pain.  Her abdominal pain has been getting worse.  In the ER patient had a drop in hemoglobin from 14.6 on 01/19/2019 3-10.6 today.  Patient is occult stool positive.  CT abdomen pelvis shows  Wall thickening of the gastric antrum suggestive of gastritis.  Patient's daughter is at bedside.  She is not eager for her mom to have a EGD, given her age and dementia.  Review of Systems:  The patient denies anorexia, fever, weight loss,, vision loss, decreased hearing, hoarseness, chest pain, syncope, dyspnea on exertion, peripheral edema, balance deficits, hemoptysis, abdominal pain, melena, hematochezia, severe indigestion/heartburn, hematuria, incontinence, genital sores, muscle weakness, suspicious skin lesions, transient blindness, difficulty walking, depression, unusual weight change, abnormal bleeding, enlarged lymph nodes, angioedema, and breast masses. Positives: Abdominal pain, back pain  Past Medical History: Past Medical History:  Diagnosis Date   Aortic atherosclerosis ([facility])    Arthritis    Chronic bilateral low back pain    Colitis    Dementia ([facility])    GERD (gastroesophageal reflux disease)    History of bone density study    History of mammogram    Hypertension, essential 04/19/2016   Hypothyroid    Left bundle branch block    Osteoporosis    Paroxysmal atrial fibrillation [facility])    Past Surgical History:  Procedure Laterality Date   APPENDECTOMY     CARDIOVASCULAR STRESS TEST  03/24/2009   EF 78%   CHOLECYSTECTOMY      COLONOSCOPY     TONSILLECTOMY AND ADENOIDECTOMY     US ECHOCARDIOGRAPHY  05/07/2008   Est EF 50-55%    Medications: Prior to Admission medications   Medication Sig Start Date End Date Taking? Authorizing Provider  acetaminophen (TYLENOL) 500 MG tablet Take 500 mg by mouth every 6 (six) hours as needed for mild pain.    [provider]  amLODipine (NORVASC) 2.5 MG tablet TAKE 1 TABLET BY MOUTH EVERY DAY 12/01/21   Terry, Gina M, MD  calcium carbonate (TUMS - DOSED IN MG ELEMENTAL CALCIUM) 500 MG chewable tablet Chew 3 tablets by mouth daily as needed for indigestion or heartburn.    [provider]  clopidogrel (PLAVIX) 75 MG tablet TAKE 1 TABLET BY MOUTH EVERY DAY 12/01/21   Terry, Gina M, MD  gabapentin (NEURONTIN) 100 MG capsule Take 1 capsule (100 mg total) by mouth daily as needed. 02/14/22   Terry, Gina C, NP  levothyroxine (SYNTHROID) 50 MCG tablet Take 1 tablet (50 mcg total) by mouth daily before breakfast. 12/01/21   Gina Honour, MD  lidocaine (LIDODERM) 5 % Place 1 patch onto the skin daily. Remove & Discard patch within 12 hours or as directed by MD 01/26/22   Gina Meadow, MD  loperamide (IMODIUM A-D) 2 MG capsule Take 1 capsule (2 mg total) by mouth as needed for diarrhea or loose stools. 08/27/20   Terry, Gina C, NP  methocarbamol (ROBAXIN) 500 MG tablet Take 1 tablet (500 mg total) by mouth 4 (four) times daily. 02/22/22 04/05/22  Gina Fielding,  MD  metoprolol succinate (TOPROL-XL) 25 MG 24 hr tablet Take 1 tablet (25 mg total) by mouth daily. 10/28/21   Terry, Gina M, MD  naproxen (NAPROSYN) 500 MG tablet Take 0.5 tablets (250 mg total) by mouth 2 (two) times daily with a meal. 02/22/22   Gina Fielding, MD  nitroGLYCERIN (NITROSTAT) 0.4 MG SL tablet Place 1 tablet (0.4 mg total) under the tongue every 5 (five) minutes as needed for chest pain. Patient not taking: Reported on 02/14/2022 02/19/19 01/31/48  Terry, Gina M, MD  polyethylene glycol  (MIRALAX / GLYCOLAX) 17 g packet Take 17 g by mouth daily as needed for mild constipation or moderate constipation.    [provider]  zolpidem (AMBIEN) 5 MG tablet TAKE 1 TABLET BY MOUTH EVERYDAY AT BEDTIME 01/31/22   Gina Honour, MD    Allergies:   Allergies  Allergen Reactions   Rofecoxib     Other reaction(s): Other (See Comments) reflux, edema    Sulfamethoxazole-Trimethoprim Nausea Only   Codeine Other (See Comments)    unknown Other reaction(s): GI Upset (intolerance)   Estradiol Rash    Local rash   Flurbiprofen     Other reaction(s): GI Upset (intolerance)   Ibuprofen     Other reaction(s): GI Upset (intolerance)    Social History:  reports that she quit smoking about 38 years ago. Her smoking use included cigarettes. She has never used smokeless tobacco. She reports current alcohol use. She reports that she does not use drugs.  Family History: Family History  Problem Relation Age of Onset   Hip fracture Mother    Heart attack Father    Breast cancer Sister    Stroke Brother     Physical Exam: Vitals:   02/26/22 0149 02/26/22 0200 02/26/22 0340 02/26/22 0400  BP: (!) 116/53 (!) 117/59 132/62 128/63  Pulse: 68 65 69 61  Resp: (!) 25 (!) 21 (!) 21 19  Temp:      TempSrc:      SpO2: 95% 94% 94% 92%    General:  Alert and oriented times three, very cachectic female, no acute distress Eyes: PERRLA, pink conjunctiva, no scleral icterus ENT: Moist oral mucosa, neck supple, no thyromegaly Lungs: clear to ascultation, no wheeze, no crackles, no use of accessory muscles Cardiovascular: regular rate and rhythm, no regurgitation, no gallops, no murmurs. No carotid bruits, no JVD Abdomen: soft, positive BS, pretty notable tender epigastric tenderness, non-distended, no organomegaly, not an acute abdomen GU: not examined Neuro: CN II - XII grossly intact, sensation intact Musculoskeletal: strength 5/5 all extremities, no clubbing, cyanosis or  edema Skin: no rash, no subcutaneous crepitation, no decubitus Psych: appropriate patient   Labs on Admission:  Recent Labs    02/26/22 0155  NA 130*  K 4.7  CL 101  CO2 22  GLUCOSE 111*  BUN 53*  CREATININE 0.73  CALCIUM 8.7*   Recent Labs    02/26/22 0155  AST 17  ALT 11  ALKPHOS 37*  BILITOT 0.4  PROT 5.6*  ALBUMIN 3.1*   Recent Labs    02/26/22 0155  LIPASE 56*   Recent Labs    02/26/22 0155  WBC 16.3*  NEUTROABS 12.4*  HGB 10.6*  HCT 31.8*  MCV 92.2  PLT 197    Radiological Exams on Admission: CT ABDOMEN PELVIS W CONTRAST  Result Date: 02/26/2022 CLINICAL DATA:  Right lower quadrant pain.  History of colitis. EXAM: CT ABDOMEN AND PELVIS WITH CONTRAST TECHNIQUE: Multidetector  CT imaging of the abdomen and pelvis was performed using the standard protocol following bolus administration of intravenous contrast. RADIATION DOSE REDUCTION: This exam was performed according to the departmental dose-optimization program which includes automated exposure control, adjustment of the mA and/or kV according to patient size and/or use of iterative reconstruction technique. CONTRAST:  <See Chart> OMNIPAQUE IOHEXOL 300 MG/ML SOLN, 68m OMNIPAQUE IOHEXOL 300 MG/ML SOLN COMPARISON:  CT 01/26/2022 FINDINGS: Lower chest: Advanced emphysema. Hepatobiliary: No focal liver abnormality is seen. Status post cholecystectomy. No biliary dilatation. Pancreas: Unremarkable. Spleen: Unremarkable. Adrenals/Urinary Tract: Normal adrenal glands. Bilateral cortical renal scarring. No solid renal lesion. No definite urinary calculi. Unremarkable bladder. Stomach/Bowel: Extensive colonic diverticula. No definite diverticulitis. Wall thickening of the gastric antrum. The appendix is not definitively visualized. Vascular/Lymphatic: Aortic atherosclerosis. No enlarged abdominal or pelvic lymph nodes. Reproductive: Uterus and bilateral adnexa are unremarkable. Other: No free intraperitoneal air.  Musculoskeletal: Demineralization. No acute fracture. Sclerosis in both femoral head suspicious for AVN. IMPRESSION: Wall thickening of the gastric antrum suggestive of gastritis. Consider correlation with endoscopy. Colonic diverticulosis without diverticulitis. Aortic Atherosclerosis (ICD10-I70.0). Electronically Signed   By: TPlacido SouM.D.   On: 02/26/2022 03:39    Assessment/Plan Present on Admission:  UGIB (upper gastrointestinal bleed)/ Gastritis -Admit to MedSurg -N.p.o., IV fluid hydration -Trending H&H every 12 -IV Protonix every 12 -Daughter not eager for EGD, willing to discontinue NSAIDs.  If hemoglobin continues to fall, will then pursue discussion reEGD -NSAID DC'd, Plavix held -SCDs for DVT prophylaxis   Hypertension, essential -Resume Norvasc and metoprolol   Paroxysmal atrial fibrillation ([facility]) -Resume metoprolol -Not on anticoagulation   Primary hypothyroidism -Resume Synthroid   Hyponatremia -Chronic, monitor   History of TIA -Plavix on hold, continue blood pressure control  Leukocytosis -Chronic, no evidence of infection -UA and chest x-ray ordered   Dementia without behavioral disturbance -Aware  Gina Terry 02/26/2022, 4:49 AM

## 2022-02-26 NOTE — ED Notes (Signed)
Caregiver [place] advised to please call her if pt was to be discharge [phone], She stays with pt as daughter Opal Sidles is 4 hrs away

## 2022-02-26 NOTE — ED Notes (Signed)
POC occult blood POSITIVE

## 2022-02-26 NOTE — ED Triage Notes (Signed)
Pt from home BIB [facility], initially called out for abd pain, when EMS arrived they asked her about her belly pain, pt reports "I don't know". Pt was slightly combative in route, but appears calmer on arrival. 90% on RA, EMS placed pt on 2L [place], EMS reports AMS, but believes pt is alter at her baseline. 92% on RA on arrival. Pt repeats "I don't care, you don't care". Hx of Afib and LBBB. Pt alert.   PT HAS DNR FORM WITH HER

## 2022-02-27 DIAGNOSIS — K922 Gastrointestinal hemorrhage, unspecified: Secondary | ICD-10-CM | POA: Diagnosis not present

## 2022-02-27 LAB — CBC
HCT: 31 % — ABNORMAL LOW (ref 36.0–46.0)
Hemoglobin: 10.2 g/dL — ABNORMAL LOW (ref 12.0–15.0)
MCH: 31 pg (ref 26.0–34.0)
MCHC: 32.9 g/dL (ref 30.0–36.0)
MCV: 94.2 fL (ref 80.0–100.0)
Platelets: 181 10*3/uL (ref 150–400)
RBC: 3.29 MIL/uL — ABNORMAL LOW (ref 3.87–5.11)
RDW: 14.9 % (ref 11.5–15.5)
WBC: 13 10*3/uL — ABNORMAL HIGH (ref 4.0–10.5)
nRBC: 0 % (ref 0.0–0.2)

## 2022-02-27 LAB — PROTIME-INR
INR: 1.1 (ref 0.8–1.2)
Prothrombin Time: 14 seconds (ref 11.4–15.2)

## 2022-02-27 LAB — BASIC METABOLIC PANEL
Anion gap: 8 (ref 5–15)
BUN: 26 mg/dL — ABNORMAL HIGH (ref 8–23)
CO2: 22 mmol/L (ref 22–32)
Calcium: 8.3 mg/dL — ABNORMAL LOW (ref 8.9–10.3)
Chloride: 100 mmol/L (ref 98–111)
Creatinine, Ser: 0.81 mg/dL (ref 0.44–1.00)
GFR, Estimated: >60 mL/min (ref >60)
Glucose, Bld: 93 mg/dL (ref 70–99)
Potassium: 4.4 mmol/L (ref 3.5–5.1)
Sodium: 130 mmol/L — ABNORMAL LOW (ref 135–145)

## 2022-02-27 MED ORDER — PANTOPRAZOLE SODIUM 40 MG PO TBEC: 40.0000 mg | DELAYED_RELEASE_TABLET | Freq: Two times a day (BID) | ORAL | 1 refills | Status: AC

## 2022-02-27 MED ORDER — SUCRALFATE 1 G PO TABS: 1.0000 g | ORAL_TABLET | Freq: Four times a day (QID) | ORAL | 0 refills | Status: DC

## 2022-02-27 MED ORDER — CLOPIDOGREL BISULFATE 75 MG PO TABS: 75.0000 mg | ORAL_TABLET | Freq: Every day | ORAL | 2 refills | Status: DC

## 2022-02-27 NOTE — Progress Notes (Signed)
  Transition of Care [facility]) Screening Note   Patient Details  Name: Gina Terry Date of Birth: September 17, 1921   [facility]) CM/SW Contact:    Dessa Phi, RN Phone Number: 02/27/2022, 10:37 AM    Transition of Care Department [facility]) has reviewed patient and no TOC needs have been identified at this time. We will continue to monitor patient advancement through interdisciplinary progression rounds. If new patient transition needs arise, please place a TOC consult.

## 2022-02-27 NOTE — Plan of Care (Signed)

## 2022-02-27 NOTE — Discharge Summary (Signed)
Physician Discharge Summary  Gina Terry TKW:[id number] DOB: [birth year] DOA: 02/26/2022  PCP: Wardell Honour, MD  Admit date: 02/26/2022 Discharge date: 02/27/2022  Admitted From: Home Disposition:  Home  Discharge Condition:Stable CODE STATUS:DNR Diet recommendation: soft diet for next 2-3 days  Brief/Interim Summary: Patient is a 89 year old female with history of hypertension, paroxysmal A-fib not on anticoagulation, left bundle branch block, dementia, hypothyroidism who lives independently on supervision of 24 hr caretaker and uses walker for ambulation presented with complaint of abdominal pain.  She was taking naproxen for back pain.  In the emergency department she was found to have a hemoglobin of 10.6, last Hb on December was 14.6.  FOBT positive.  CT abdomen/pelvis showed wall thickening of the gastric antrum suggestive  of gastritis.  After discussion with family, they donot wish any kind of intervention including EGD.  Started on Protonix.  Hospital course remained stable.  Hemoglobin remained stable in the range of 10.  No further episode of hematochezia or melena.  Denies abdominal pain today, nausea nausea or vomiting.  Long discussion held with the daughter and patient at the bedside.  They want to go home today.  We recommend to continue Protonix, Carafate.  Check CBC in a week.  Following problems were addressed during the hospitalization:  Upper GI bleed: Presented with abdominal pain.  Was taking naproxen for back pain.  Hemoglobin found to have dropped to the range of 10 from 14, suspicious for upper GI bleed.  FOBT positive.  After discussion with family, they are not interested on kind of intervention including EGD.  Plan for conservative management with Protonix.   Avoid NSAIDs, anticoagulants. No report of hematochezia or melena.  Remains comfortable today. Hemoglobin has remained stable.  Hold Plavix for a week.  Discussed with PCP whether to continue Plavix or can be  discharged to aspirin.  Patient has high risk for bleeding in the future.  Start on Carafate.  Continue Protonix twice a day for a month then continue daily   Hypertension: Continue Norvasc, metoprolol   Paroxysmal A-fib: Currently in normal sinus rhythm.  Continue metoprolol.  Not on anticoagulation   Hypothyroidism: Continue Synthyroid   Hyponatremia: Likely Chronic.  Continue to monitor, check BMP in a week   History of TIA: On Plavix at home.  Currently on hold   Leukocytosis: Chronic, no evidence of infection.  UA, chest x-ray checked without any finding of infectious process.  Improved   Dementia: Continue supportive care.  Discharge Diagnoses:  Principal Problem:   UGIB (upper gastrointestinal bleed) Active Problems:   Paroxysmal atrial fibrillation (HCC)   Hypertension, essential   Primary hypothyroidism   Gastritis   Dementia without behavioral disturbance ([place])   Hyponatremia   Leukocytosis    Discharge Instructions  Discharge Instructions     Diet general   Complete by: As directed    Soft diet   Discharge instructions   Complete by: As directed    1)Please take prescribed medications as instructed 2)Follow up with your PCP in a week.  Do a CBC, BMP tests during the follow-up.  Do not take Plavix until you see your PCP. 3)Continue soft diet for next 2 to 3 days   Increase activity slowly   Complete by: As directed       Allergies as of 02/27/2022       Reactions   Rofecoxib    Other reaction(s): Other (See Comments) reflux, edema    Sulfamethoxazole-trimethoprim Nausea Only  Codeine Other (See Comments)   unknown Other reaction(s): GI Upset (intolerance)   Estradiol Rash   Local rash   Flurbiprofen    Other reaction(s): GI Upset (intolerance)   Ibuprofen    Other reaction(s): GI Upset (intolerance)        Medication List     STOP taking these medications    naproxen 500 MG tablet Commonly known as: NAPROSYN       TAKE these  medications    acetaminophen 500 MG tablet Commonly known as: TYLENOL Take 500 mg by mouth every 6 (six) hours as needed for mild pain.   amLODipine 2.5 MG tablet Commonly known as: NORVASC TAKE 1 TABLET BY MOUTH EVERY DAY   calcium carbonate 500 MG chewable tablet Commonly known as: TUMS - dosed in mg elemental calcium Chew 3 tablets by mouth daily as needed for indigestion or heartburn.   clopidogrel 75 MG tablet Commonly known as: PLAVIX Take 1 tablet (75 mg total) by mouth daily. We recommend to hold this medication until you see your PCP.Follow up in a week.  Discuss if this needs to be continued or can be switched to aspirin What changed: additional instructions   gabapentin 100 MG capsule Commonly known as: NEURONTIN Take 1 capsule (100 mg total) by mouth daily as needed.   levothyroxine 50 MCG tablet Commonly known as: SYNTHROID Take 1 tablet (50 mcg total) by mouth daily before breakfast.   lidocaine 5 % Commonly known as: Lidoderm Place 1 patch onto the skin daily. Remove & Discard patch within 12 hours or as directed by MD   loperamide 2 MG capsule Commonly known as: Imodium A-D Take 1 capsule (2 mg total) by mouth as needed for diarrhea or loose stools.   methocarbamol 500 MG tablet Commonly known as: ROBAXIN Take 1 tablet (500 mg total) by mouth 4 (four) times daily. What changed: how much to take   metoprolol succinate 25 MG 24 hr tablet Commonly known as: TOPROL-XL Take 1 tablet (25 mg total) by mouth daily.   nitroGLYCERIN 0.4 MG SL tablet Commonly known as: NITROSTAT Place 1 tablet (0.4 mg total) under the tongue every 5 (five) minutes as needed for chest pain.   pantoprazole 40 MG tablet Commonly known as: Protonix Take 1 tablet (40 mg total) by mouth 2 (two) times daily. Take 1 pill twice a day for a month then continue taking once daily   polyethylene glycol 17 g packet Commonly known as: MIRALAX / GLYCOLAX Take 17 g by mouth daily as needed  for mild constipation or moderate constipation.   sucralfate 1 g tablet Commonly known as: Carafate Take 1 tablet (1 g total) by mouth 4 (four) times daily for 14 days.   zolpidem 5 MG tablet Commonly known as: AMBIEN TAKE 1 TABLET BY MOUTH EVERYDAY AT BEDTIME What changed: See the new instructions.        Follow-up Information     Wardell Honour, MD. Schedule an appointment as soon as possible for a visit in 1 week(s).   Specialties: Family Medicine, Emergency Medicine Contact information: [place] [address] [phone]                Allergies  Allergen Reactions   Rofecoxib     Other reaction(s): Other (See Comments) reflux, edema    Sulfamethoxazole-Trimethoprim Nausea Only   Codeine Other (See Comments)    unknown Other reaction(s): GI Upset (intolerance)   Estradiol Rash  Local rash   Flurbiprofen     Other reaction(s): GI Upset (intolerance)   Ibuprofen     Other reaction(s): GI Upset (intolerance)    Consultations: None   Procedures/Studies: DG CHEST PORT 1 VIEW  Result Date: 02/26/2022 CLINICAL DATA:  89 year old female with abdominal pain, leukocytosis. EXAM: PORTABLE CHEST 1 VIEW COMPARISON:  CT Abdomen and Pelvis 0315 hours today. Portable chest 01/25/2019. FINDINGS: Portable AP semi upright view at 0506 hours. Hyperinflated lung volumes. Bilateral lung base emphysema and scarring on CT Abdomen and Pelvis this morning. Mediastinal contours are stable since 2020, tortuous thoracic aorta. Visualized tracheal air column is within normal limits. No superimposed pneumothorax, pulmonary edema, pleural effusion, or acute pulmonary opacity. Stable visualized osseous structures, including a chronic midthoracic compression fracture which was present in 2020. IMPRESSION: Chronic lung disease with Emphysema (ICD10-J43.9). No acute cardiopulmonary abnormality. Electronically Signed   By: Genevie Ann M.D.   On: 02/26/2022 05:59   CT ABDOMEN  PELVIS W CONTRAST  Result Date: 02/26/2022 CLINICAL DATA:  Right lower quadrant pain.  History of colitis. EXAM: CT ABDOMEN AND PELVIS WITH CONTRAST TECHNIQUE: Multidetector CT imaging of the abdomen and pelvis was performed using the standard protocol following bolus administration of intravenous contrast. RADIATION DOSE REDUCTION: This exam was performed according to the departmental dose-optimization program which includes automated exposure control, adjustment of the mA and/or kV according to patient size and/or use of iterative reconstruction technique. CONTRAST:  <See Chart> OMNIPAQUE IOHEXOL 300 MG/ML SOLN, 9m OMNIPAQUE IOHEXOL 300 MG/ML SOLN COMPARISON:  CT 01/26/2022 FINDINGS: Lower chest: Advanced emphysema. Hepatobiliary: No focal liver abnormality is seen. Status post cholecystectomy. No biliary dilatation. Pancreas: Unremarkable. Spleen: Unremarkable. Adrenals/Urinary Tract: Normal adrenal glands. Bilateral cortical renal scarring. No solid renal lesion. No definite urinary calculi. Unremarkable bladder. Stomach/Bowel: Extensive colonic diverticula. No definite diverticulitis. Wall thickening of the gastric antrum. The appendix is not definitively visualized. Vascular/Lymphatic: Aortic atherosclerosis. No enlarged abdominal or pelvic lymph nodes. Reproductive: Uterus and bilateral adnexa are unremarkable. Other: No free intraperitoneal air. Musculoskeletal: Demineralization. No acute fracture. Sclerosis in both femoral head suspicious for AVN. IMPRESSION: Wall thickening of the gastric antrum suggestive of gastritis. Consider correlation with endoscopy. Colonic diverticulosis without diverticulitis. Aortic Atherosclerosis (ICD10-I70.0). Electronically Signed   By: TPlacido SouM.D.   On: 02/26/2022 03:39      Subjective: Patient seen and examined at bedside today.  Medically stable for discharge.  Discharge planning discussed with daughter at bedside  Discharge Exam: Vitals:   02/27/22  0005 02/27/22 0400  BP: 130/60 128/61  Pulse: (!) 55 91  Resp: 16 16  Temp: 97.8 F (36.6 C) 97.7 F (36.5 C)  SpO2: 100% 92%   Vitals:   02/26/22 1643 02/26/22 2009 02/27/22 0005 02/27/22 0400  BP: (!) 162/63 (!) 139/59 130/60 128/61  Pulse: (!) 55 (!) 58 (!) 55 91  Resp: '18 16 16 16  '$ Temp: 97.7 F (36.5 C) 97.9 F (36.6 C) 97.8 F (36.6 C) 97.7 F (36.5 C)  TempSrc:  Oral Oral Oral  SpO2: 99% 100% 100% 92%    General: Pt is alert, awake, not in acute distress Cardiovascular: RRR, S1/S2 +, no rubs, no gallops Respiratory: CTA bilaterally, no wheezing, no rhonchi Abdominal: Soft, NT, ND, bowel sounds + Extremities: no edema, no cyanosis    The results of significant diagnostics from this hospitalization (including imaging, microbiology, ancillary and laboratory) are listed below for reference.     Microbiology: No results found for this or any previous  visit (from the past 240 hour(s)).   Labs: BNP (last 3 results) No results for input(s): "BNP" in the last 8760 hours. Basic Metabolic Panel: Recent Labs  Lab 02/26/22 0155 02/27/22 0330  NA 130* 130*  K 4.7 4.4  CL 101 100  CO2 22 22  GLUCOSE 111* 93  BUN 53* 26*  CREATININE 0.73 0.81  CALCIUM 8.7* 8.3*   Liver Function Tests: Recent Labs  Lab 02/26/22 0155  AST 17  ALT 11  ALKPHOS 37*  BILITOT 0.4  PROT 5.6*  ALBUMIN 3.1*   Recent Labs  Lab 02/26/22 0155  LIPASE 56*   No results for input(s): "AMMONIA" in the last 168 hours. CBC: Recent Labs  Lab 02/26/22 0155 02/26/22 0515 02/26/22 1650 02/27/22 0330  WBC 16.3*  --   --  13.0*  NEUTROABS 12.4*  --   --   --   HGB 10.6* 10.6* 10.8* 10.2*  HCT 31.8* 31.8* 33.2* 31.0*  MCV 92.2  --   --  94.2  PLT 197  --   --  181   Cardiac Enzymes: No results for input(s): "CKTOTAL", "CKMB", "CKMBINDEX", "TROPONINI" in the last 168 hours. BNP: Invalid input(s): "POCBNP" CBG: No results for input(s): "GLUCAP" in the last 168 hours. D-Dimer No  results for input(s): "DDIMER" in the last 72 hours. Hgb A1c No results for input(s): "HGBA1C" in the last 72 hours. Lipid Profile No results for input(s): "CHOL", "HDL", "LDLCALC", "TRIG", "CHOLHDL", "LDLDIRECT" in the last 72 hours. Thyroid function studies No results for input(s): "TSH", "T4TOTAL", "T3FREE", "THYROIDAB" in the last 72 hours.  Invalid input(s): "FREET3" Anemia work up No results for input(s): "VITAMINB12", "FOLATE", "FERRITIN", "TIBC", "IRON", "RETICCTPCT" in the last 72 hours. Urinalysis    Component Value Date/Time   COLORURINE STRAW (A) 02/26/2022 1259   APPEARANCEUR CLEAR 02/26/2022 1259   LABSPEC 1.024 02/26/2022 1259   PHURINE 5.0 02/26/2022 1259   GLUCOSEU NEGATIVE 02/26/2022 1259   HGBUR SMALL (A) 02/26/2022 1259   BILIRUBINUR NEGATIVE 02/26/2022 1259   BILIRUBINUR Large 09/07/2020 0930   KETONESUR NEGATIVE 02/26/2022 1259   PROTEINUR NEGATIVE 02/26/2022 1259   UROBILINOGEN negative (A) 09/07/2020 0930   NITRITE NEGATIVE 02/26/2022 1259   LEUKOCYTESUR NEGATIVE 02/26/2022 1259   Sepsis Labs Recent Labs  Lab 02/26/22 0155 02/27/22 0330  WBC 16.3* 13.0*   Microbiology No results found for this or any previous visit (from the past 240 hour(s)).  Please note: You were cared for by a hospitalist during your hospital stay. Once you are discharged, your primary care physician will handle any further medical issues. Please note that NO REFILLS for any discharge medications will be authorized once you are discharged, as it is imperative that you return to your primary care physician (or establish a relationship with a primary care physician if you do not have one) for your post hospital discharge needs so that they can reassess your need for medications and monitor your lab values.    Time coordinating discharge: 40 minutes  SIGNED:   Shelly Coss, MD  [facility] 02/27/2022, 10:32 AM Pager [phone]  If 7PM-7AM, please contact  night-coverage [email or web address] Password [email or web address]

## 2022-02-28 ENCOUNTER — Other Ambulatory Visit: Payer: Self-pay | Admitting: Orthopedic Surgery

## 2022-02-28 ENCOUNTER — Telehealth: Payer: Self-pay

## 2022-02-28 NOTE — Telephone Encounter (Signed)
Patient daughter is calling because her mom was in the hospital patient was told not to Plavix anymore because the Gastro bleed. She went to the pharmacy and has been picked up the Pantoprazole 40 mg twice daily patient wanted to know if that's appropriate for her to take because she has had multiple side effects that was on the paper before taking it. Also the Carafate is too big for Patient to take and the liquid is too expensive as well

## 2022-03-01 NOTE — Telephone Encounter (Signed)
Agree, Plavix not indicated with bleeding. Pantoprazole can be taken once a day. Pharmacy can make the Carafate into a liquid slurry which will be easy to take

## 2022-03-03 ENCOUNTER — Telehealth: Payer: Self-pay

## 2022-03-04 NOTE — Telephone Encounter (Signed)
Please begin Hospice care

## 2022-03-08 ENCOUNTER — Inpatient Hospital Stay: Payer: Medicare HMO | Admitting: Family Medicine

## 2022-04-05 ENCOUNTER — Ambulatory Visit: Payer: Medicare HMO | Admitting: Orthopedic Surgery

## 2022-06-21 ENCOUNTER — Ambulatory Visit: Payer: Medicare Other | Admitting: Family Medicine

## 2022-06-28 ENCOUNTER — Ambulatory Visit: Payer: Medicare HMO | Admitting: Family Medicine

## 2022-08-11 IMAGING — CT CT HEAD W/O CM
4 series · 16 of 47 positions shown, 18 images · non-contrast
Comparison: None Available.

CLINICAL DATA: Transient ischemic attack (TIA)



[Series 3: head without · axial · non-contrast · 0.42mm/px · z∈[-111,+9]mm · 7 of 33 slices shown, 9 images]
[im 5/33  brain]
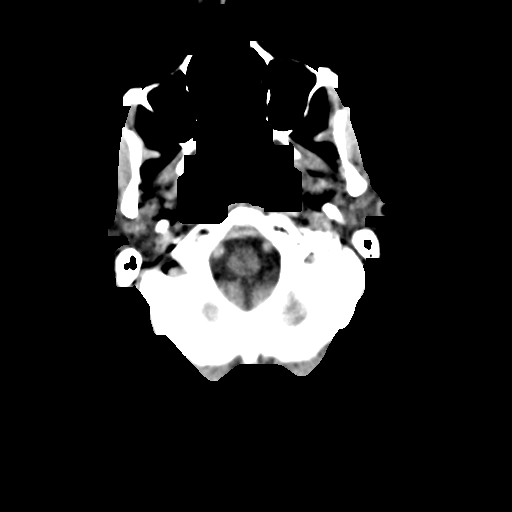
[im 5/33  bone]
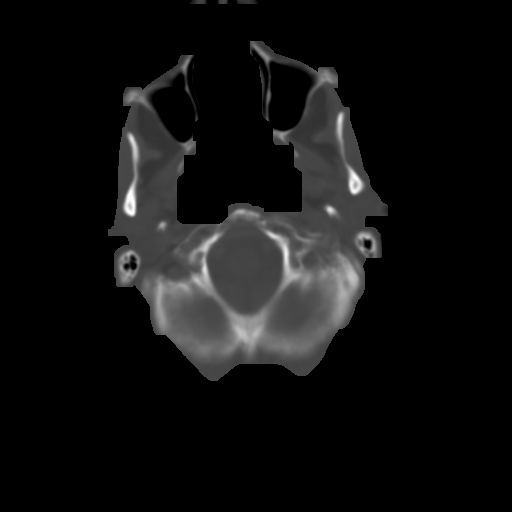
[im 9/33  brain]
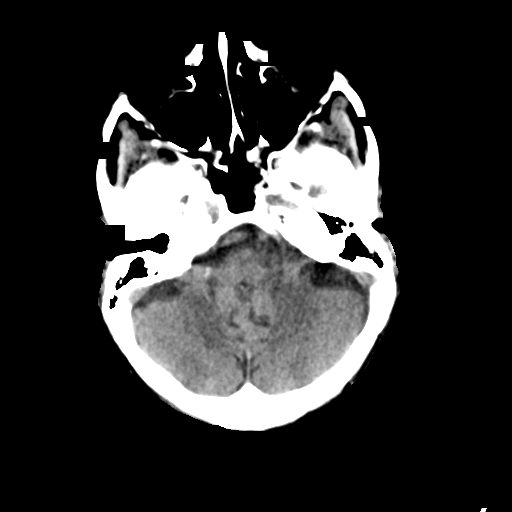
[im 13/33  brain]
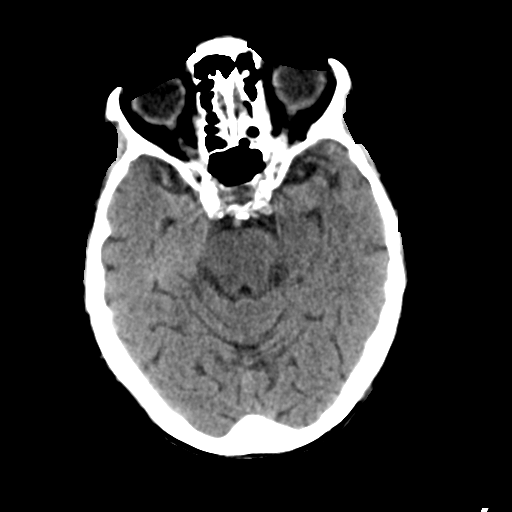
[im 17/33  brain]
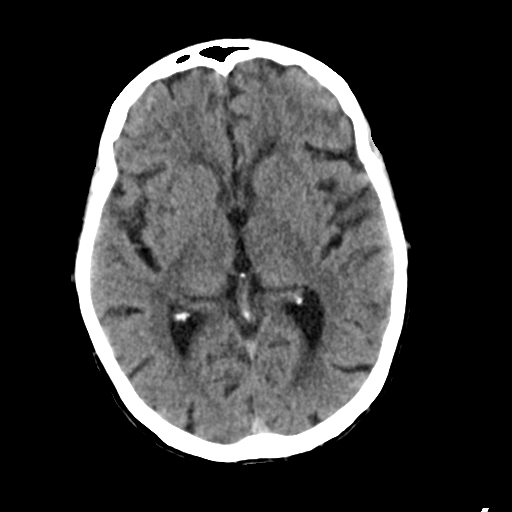
[im 21/33  brain]
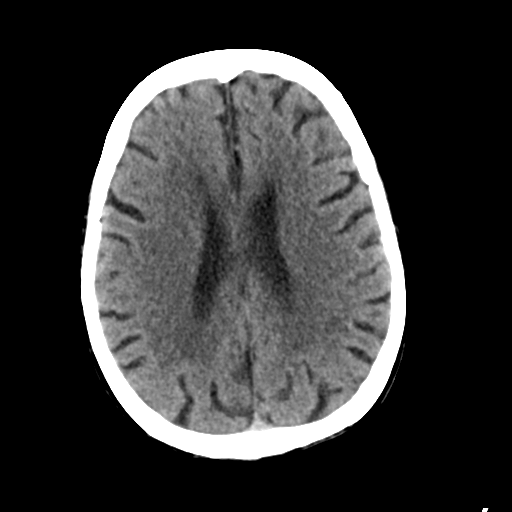
[im 21/33  bone]
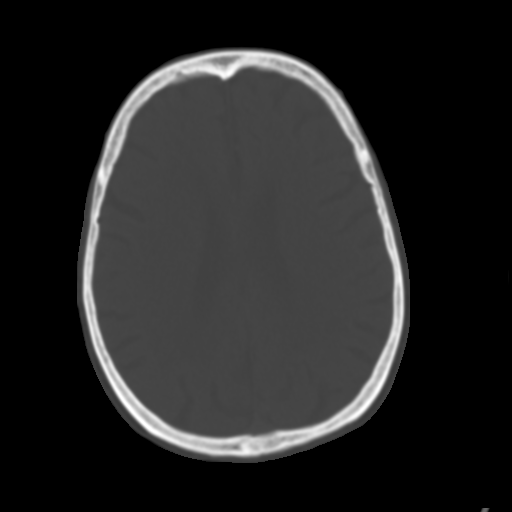
[im 25/33  brain]
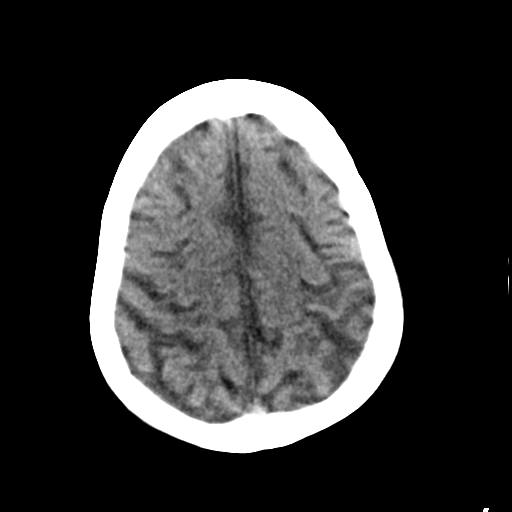
[im 29/33  brain]
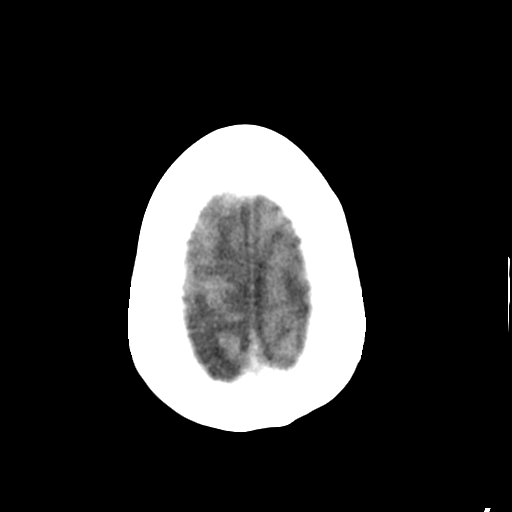

[Series 4: head bone · axial · 0.42mm/px · z∈[-115,-83]mm · 3 of 82 slices shown]
[im 9/82  bone]
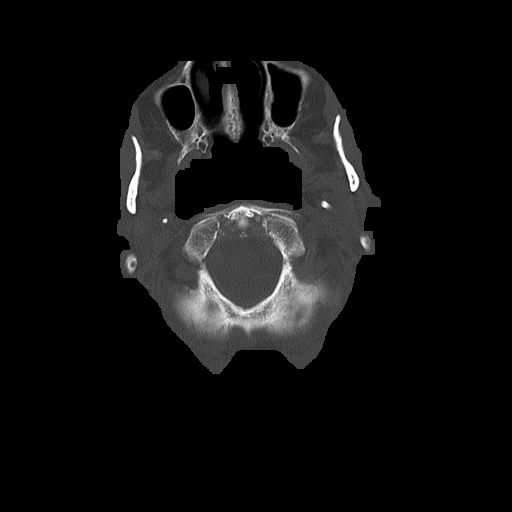
[im 17/82  bone]
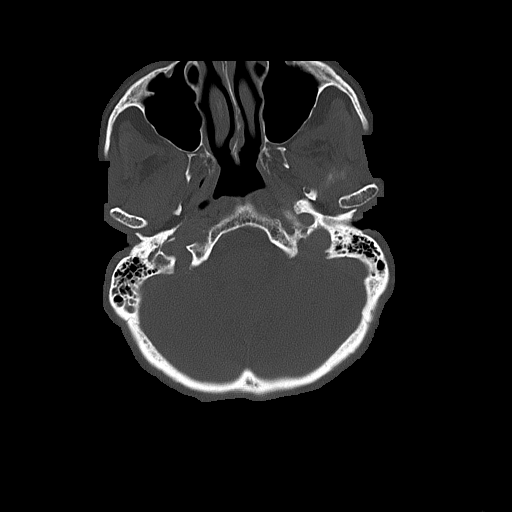
[im 25/82  bone]
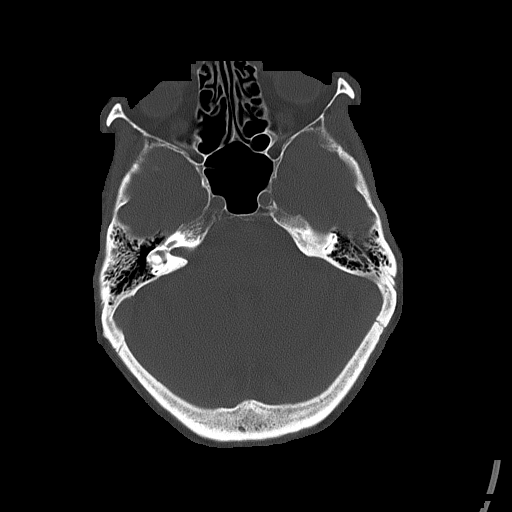

[Series 5: head without cor · coronal · non-contrast · 0.32mm/px · 3 of 67 slices shown]
[im 23/67  brain]
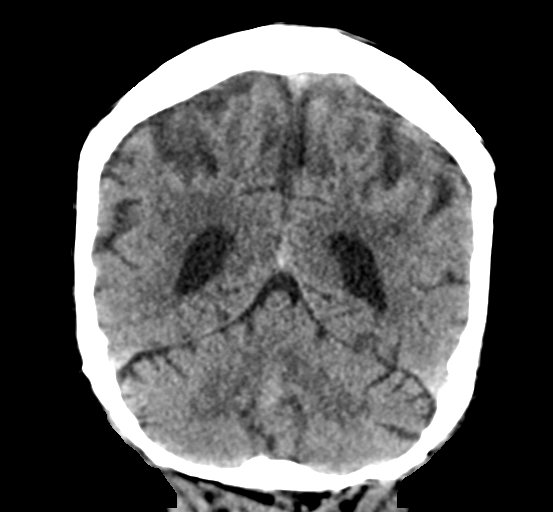
[im 30/67  brain]
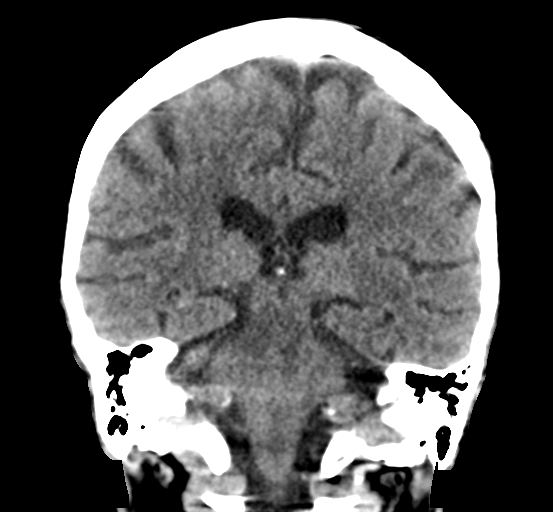
[im 37/67  brain]
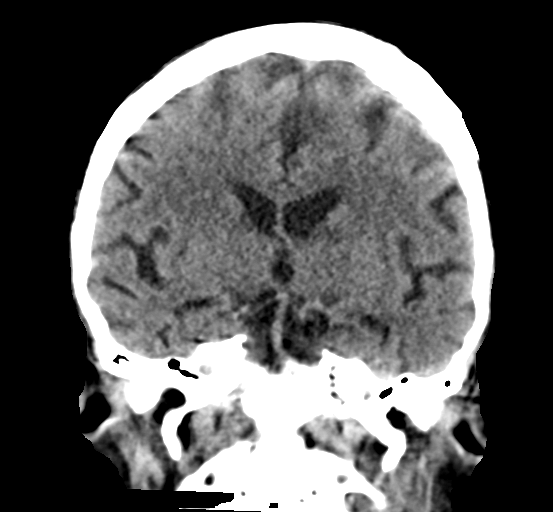

[Series 6: head without sag · sagittal · non-contrast · 0.32mm/px · 3 of 56 slices shown]
[im 19/56  brain]
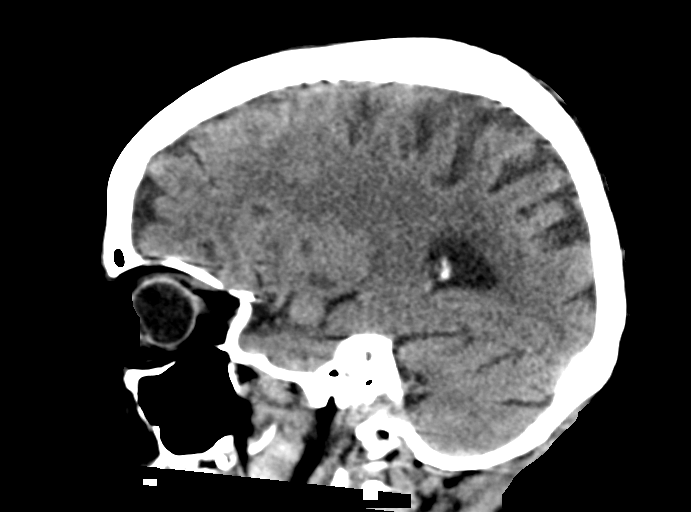
[im 28/56  brain]
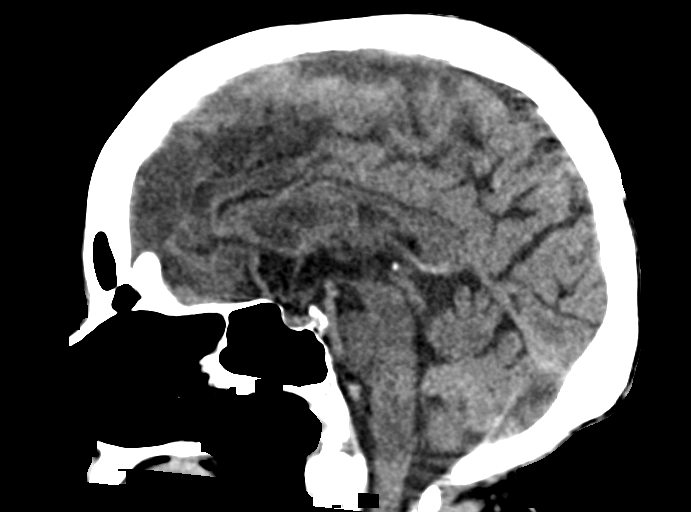
[im 37/56  brain]
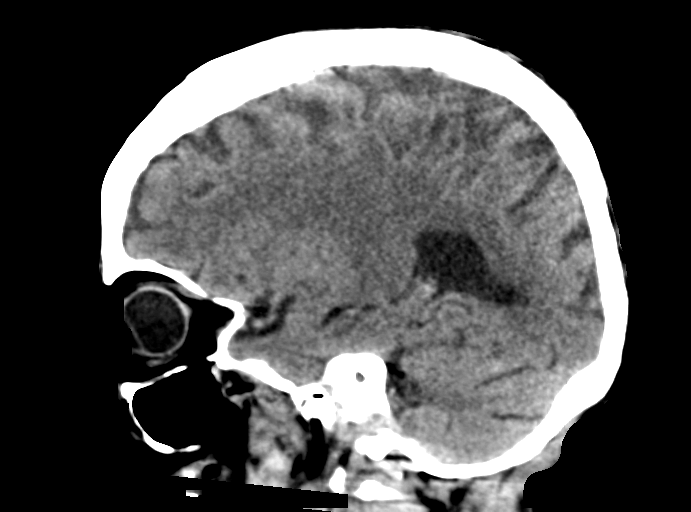

[16 of 47 positions shown; findings below may reference images not displayed]

FINDINGS: Motion artifact is present.

Brain: There is no acute intracranial hemorrhage, mass effect, or
edema. Possible age-indeterminate cortical/subcortical infarcts in
the frontoparietal lobes bilaterally. There is no extra-axial fluid
collection. Prominence of the ventricles and sulci reflects mild
generalized parenchymal volume loss. Patchy and confluent areas of
low-density in the supratentorial white matter are nonspecific may
reflect mild to moderate chronic microvascular ischemic changes.

Vascular: No definite hyperdense vessel.

Skull: Calvarium is unremarkable.

Sinuses/Orbits: No acute finding.

Other: None.
IMPRESSION: Suboptimal evaluation due to motion degradation. No acute
intracranial hemorrhage. Possible age-indeterminate
cortical/subcortical infarcts in the frontoparietal lobes
bilaterally.

## 2022-09-08 ENCOUNTER — Encounter: Payer: Self-pay | Admitting: Family Medicine

## 2022-09-11 ENCOUNTER — Encounter: Payer: Self-pay | Admitting: Family Medicine

## 2022-09-12 ENCOUNTER — Encounter: Payer: Self-pay | Admitting: Family Medicine

## 2023-02-17 ENCOUNTER — Encounter (HOSPITAL_COMMUNITY): Payer: Self-pay

## 2023-02-17 ENCOUNTER — Inpatient Hospital Stay (HOSPITAL_COMMUNITY)
Admission: EM | Admit: 2023-02-17 | Discharge: 2023-02-19 | DRG: 871 | Disposition: A | Discharge status: Hospice | Attending: Internal Medicine | Admitting: Internal Medicine

## 2023-02-17 ENCOUNTER — Inpatient Hospital Stay (HOSPITAL_COMMUNITY)

## 2023-02-17 ENCOUNTER — Other Ambulatory Visit: Payer: Self-pay

## 2023-02-17 DIAGNOSIS — Z9981 Dependence on supplemental oxygen: Secondary | ICD-10-CM

## 2023-02-17 DIAGNOSIS — F0390 Unspecified dementia without behavioral disturbance: Secondary | ICD-10-CM | POA: Diagnosis present

## 2023-02-17 DIAGNOSIS — J9621 Acute and chronic respiratory failure with hypoxia: Secondary | ICD-10-CM | POA: Diagnosis not present

## 2023-02-17 DIAGNOSIS — Z515 Encounter for palliative care: Secondary | ICD-10-CM | POA: Diagnosis not present

## 2023-02-17 DIAGNOSIS — M545 Low back pain, unspecified: Secondary | ICD-10-CM | POA: Diagnosis present

## 2023-02-17 DIAGNOSIS — Z66 Do not resuscitate: Secondary | ICD-10-CM | POA: Diagnosis present

## 2023-02-17 DIAGNOSIS — R6521 Severe sepsis with septic shock: Secondary | ICD-10-CM | POA: Diagnosis present

## 2023-02-17 DIAGNOSIS — I959 Hypotension, unspecified: Secondary | ICD-10-CM | POA: Diagnosis not present

## 2023-02-17 DIAGNOSIS — Z79890 Hormone replacement therapy (postmenopausal): Secondary | ICD-10-CM

## 2023-02-17 DIAGNOSIS — R64 Cachexia: Secondary | ICD-10-CM | POA: Diagnosis present

## 2023-02-17 DIAGNOSIS — I480 Paroxysmal atrial fibrillation: Secondary | ICD-10-CM

## 2023-02-17 DIAGNOSIS — K219 Gastro-esophageal reflux disease without esophagitis: Secondary | ICD-10-CM

## 2023-02-17 DIAGNOSIS — Z87891 Personal history of nicotine dependence: Secondary | ICD-10-CM | POA: Diagnosis not present

## 2023-02-17 DIAGNOSIS — I21A1 Myocardial infarction type 2: Secondary | ICD-10-CM | POA: Diagnosis present

## 2023-02-17 DIAGNOSIS — Z7902 Long term (current) use of antithrombotics/antiplatelets: Secondary | ICD-10-CM

## 2023-02-17 DIAGNOSIS — N390 Urinary tract infection, site not specified: Secondary | ICD-10-CM | POA: Diagnosis present

## 2023-02-17 DIAGNOSIS — N179 Acute kidney failure, unspecified: Secondary | ICD-10-CM

## 2023-02-17 DIAGNOSIS — R6520 Severe sepsis without septic shock: Secondary | ICD-10-CM | POA: Diagnosis not present

## 2023-02-17 DIAGNOSIS — A419 Sepsis, unspecified organism: Secondary | ICD-10-CM | POA: Diagnosis not present

## 2023-02-17 DIAGNOSIS — R636 Underweight: Secondary | ICD-10-CM

## 2023-02-17 DIAGNOSIS — K7200 Acute and subacute hepatic failure without coma: Secondary | ICD-10-CM | POA: Diagnosis present

## 2023-02-17 DIAGNOSIS — Z8249 Family history of ischemic heart disease and other diseases of the circulatory system: Secondary | ICD-10-CM

## 2023-02-17 DIAGNOSIS — Z79899 Other long term (current) drug therapy: Secondary | ICD-10-CM

## 2023-02-17 DIAGNOSIS — J189 Pneumonia, unspecified organism: Secondary | ICD-10-CM | POA: Diagnosis not present

## 2023-02-17 DIAGNOSIS — J690 Pneumonitis due to inhalation of food and vomit: Secondary | ICD-10-CM | POA: Diagnosis present

## 2023-02-17 DIAGNOSIS — Z803 Family history of malignant neoplasm of breast: Secondary | ICD-10-CM

## 2023-02-17 DIAGNOSIS — R7989 Other specified abnormal findings of blood chemistry: Secondary | ICD-10-CM

## 2023-02-17 DIAGNOSIS — Z681 Body mass index (BMI) 19 or less, adult: Secondary | ICD-10-CM

## 2023-02-17 DIAGNOSIS — I10 Essential (primary) hypertension: Secondary | ICD-10-CM | POA: Diagnosis present

## 2023-02-17 DIAGNOSIS — E039 Hypothyroidism, unspecified: Secondary | ICD-10-CM | POA: Diagnosis not present

## 2023-02-17 DIAGNOSIS — M810 Age-related osteoporosis without current pathological fracture: Secondary | ICD-10-CM | POA: Diagnosis present

## 2023-02-17 DIAGNOSIS — D649 Anemia, unspecified: Secondary | ICD-10-CM | POA: Diagnosis not present

## 2023-02-17 DIAGNOSIS — I447 Left bundle-branch block, unspecified: Secondary | ICD-10-CM | POA: Diagnosis present

## 2023-02-17 DIAGNOSIS — I4891 Unspecified atrial fibrillation: Secondary | ICD-10-CM

## 2023-02-17 DIAGNOSIS — G8929 Other chronic pain: Secondary | ICD-10-CM | POA: Diagnosis present

## 2023-02-17 DIAGNOSIS — Z823 Family history of stroke: Secondary | ICD-10-CM

## 2023-02-17 LAB — CBC WITH DIFFERENTIAL/PLATELET
Abs Immature Granulocytes: 1.11 10*3/uL — ABNORMAL HIGH (ref 0.00–0.07)
Basophils Absolute: 0 10*3/uL (ref 0.0–0.1)
Basophils Relative: 0 %
Eosinophils Absolute: 0 10*3/uL (ref 0.0–0.5)
Eosinophils Relative: 0 %
HCT: 33.1 % — ABNORMAL LOW (ref 36.0–46.0)
Hemoglobin: 10.5 g/dL — ABNORMAL LOW (ref 12.0–15.0)
Immature Granulocytes: 5 %
Lymphocytes Relative: 9 %
Lymphs Abs: 2 10*3/uL (ref 0.7–4.0)
MCH: 27.8 pg (ref 26.0–34.0)
MCHC: 31.7 g/dL (ref 30.0–36.0)
MCV: 87.6 fL (ref 80.0–100.0)
Monocytes Absolute: 6.7 10*3/uL — ABNORMAL HIGH (ref 0.1–1.0)
Monocytes Relative: 32 %
Neutro Abs: 11.5 10*3/uL — ABNORMAL HIGH (ref 1.7–7.7)
Neutrophils Relative %: 54 %
Platelets: UNDETERMINED 10*3/uL (ref 150–400)
RBC: 3.78 MIL/uL — ABNORMAL LOW (ref 3.87–5.11)
RDW: 17 % — ABNORMAL HIGH (ref 11.5–15.5)
Smear Review: UNDETERMINED
WBC: 21.4 10*3/uL — ABNORMAL HIGH (ref 4.0–10.5)
nRBC: 0 % (ref 0.0–0.2)

## 2023-02-17 LAB — COMPREHENSIVE METABOLIC PANEL
ALT: 22 U/L (ref 0–44)
AST: 46 U/L — ABNORMAL HIGH (ref 15–41)
Albumin: 3.1 g/dL — ABNORMAL LOW (ref 3.5–5.0)
Alkaline Phosphatase: 46 U/L (ref 38–126)
Anion gap: 13 (ref 5–15)
BUN: 19 mg/dL (ref 8–23)
CO2: 19 mmol/L — ABNORMAL LOW (ref 22–32)
Calcium: 8.8 mg/dL — ABNORMAL LOW (ref 8.9–10.3)
Chloride: 102 mmol/L (ref 98–111)
Creatinine, Ser: 1.3 mg/dL — ABNORMAL HIGH (ref 0.44–1.00)
GFR, Estimated: 37 mL/min — ABNORMAL LOW (ref >60)
Glucose, Bld: 191 mg/dL — ABNORMAL HIGH (ref 70–99)
Potassium: 3.8 mmol/L (ref 3.5–5.1)
Sodium: 134 mmol/L — ABNORMAL LOW (ref 135–145)
Total Bilirubin: 0.7 mg/dL (ref 0.0–1.2)
Total Protein: 6 g/dL — ABNORMAL LOW (ref 6.5–8.1)

## 2023-02-17 LAB — T4, FREE: Free T4: 1.06 ng/dL (ref 0.61–1.12)

## 2023-02-17 LAB — URINALYSIS, ROUTINE W REFLEX MICROSCOPIC
Bilirubin Urine: NEGATIVE
Glucose, UA: NEGATIVE mg/dL
Ketones, ur: NEGATIVE mg/dL
Nitrite: NEGATIVE
Protein, ur: >300 mg/dL — AB
Specific Gravity, Urine: 1.025 (ref 1.005–1.030)
pH: 6.5 (ref 5.0–8.0)

## 2023-02-17 LAB — MAGNESIUM: Magnesium: 1.9 mg/dL (ref 1.7–2.4)

## 2023-02-17 LAB — I-STAT CG4 LACTIC ACID, ED
Lactic Acid, Venous: 4.5 mmol/L (ref 0.5–1.9)
Lactic Acid, Venous: 4 mmol/L (ref 0.5–1.9)

## 2023-02-17 LAB — TROPONIN I (HIGH SENSITIVITY)
Troponin I (High Sensitivity): 190 ng/L (ref <18)
Troponin I (High Sensitivity): 606 ng/L (ref <18)

## 2023-02-17 LAB — LIPASE, BLOOD: Lipase: 45 U/L (ref 11–51)

## 2023-02-17 LAB — URINALYSIS, MICROSCOPIC (REFLEX)

## 2023-02-17 LAB — TSH: TSH: 99.116 u[IU]/mL — ABNORMAL HIGH (ref 0.350–4.500)

## 2023-02-17 LAB — LACTIC ACID, PLASMA: Lactic Acid, Venous: 3 mmol/L (ref 0.5–1.9)

## 2023-02-17 LAB — PREALBUMIN: Prealbumin: 12 mg/dL — ABNORMAL LOW (ref 18–38)

## 2023-02-17 MED ORDER — TRIMETHOBENZAMIDE HCL 100 MG/ML IM SOLN: 200.0000 mg | Freq: Four times a day (QID) | INTRAMUSCULAR | Status: DC | PRN

## 2023-02-17 MED ORDER — ENOXAPARIN SODIUM 40 MG/0.4ML IJ SOSY: 40.0000 mg | PREFILLED_SYRINGE | INTRAMUSCULAR | Status: DC

## 2023-02-17 MED ORDER — LEVOTHYROXINE SODIUM 25 MCG PO TABS
25.0000 ug | ORAL_TABLET | Freq: Every day | ORAL | Status: DC
  Administered 2023-02-18 – 2023-02-19 (×2): 25 ug via ORAL
  Filled 2023-02-17 (×2): qty 1

## 2023-02-17 MED ORDER — LORAZEPAM 2 MG/ML IJ SOLN
0.5000 mg | Freq: Once | INTRAMUSCULAR | Status: AC
  Administered 2023-02-17: 0.5 mg via INTRAVENOUS
  Filled 2023-02-17: qty 1

## 2023-02-17 MED ORDER — ACETAMINOPHEN 325 MG PO TABS: 650.0000 mg | ORAL_TABLET | Freq: Four times a day (QID) | ORAL | Status: DC | PRN

## 2023-02-17 MED ORDER — PIPERACILLIN-TAZOBACTAM 3.375 G IVPB
3.3750 g | Freq: Two times a day (BID) | INTRAVENOUS | Status: DC
  Administered 2023-02-17 – 2023-02-18 (×3): 3.375 g via INTRAVENOUS
  Filled 2023-02-17 (×3): qty 50

## 2023-02-17 MED ORDER — ONDANSETRON HCL 4 MG/2ML IJ SOLN
4.0000 mg | Freq: Once | INTRAMUSCULAR | Status: AC
  Administered 2023-02-17: 4 mg via INTRAVENOUS
  Filled 2023-02-17: qty 2

## 2023-02-17 MED ORDER — SODIUM CHLORIDE 0.9 % IV BOLUS: 1000.0000 mL | Freq: Once | INTRAVENOUS | Status: DC

## 2023-02-17 MED ORDER — PANTOPRAZOLE SODIUM 40 MG PO TBEC
40.0000 mg | DELAYED_RELEASE_TABLET | Freq: Every day | ORAL | Status: DC
  Administered 2023-02-18 – 2023-02-19 (×2): 40 mg via ORAL
  Filled 2023-02-17 (×2): qty 1

## 2023-02-17 MED ORDER — IOHEXOL 350 MG/ML SOLN
50.0000 mL | Freq: Once | INTRAVENOUS | Status: AC | PRN
  Administered 2023-02-17: 50 mL via INTRAVENOUS

## 2023-02-17 MED ORDER — SODIUM CHLORIDE 0.9 % IV BOLUS
500.0000 mL | Freq: Once | INTRAVENOUS | Status: AC
  Administered 2023-02-17: 500 mL via INTRAVENOUS

## 2023-02-17 MED ORDER — ACETAMINOPHEN 650 MG RE SUPP: 650.0000 mg | Freq: Four times a day (QID) | RECTAL | Status: DC | PRN

## 2023-02-17 MED ORDER — SODIUM CHLORIDE 0.9 % IV SOLN: INTRAVENOUS | Status: DC

## 2023-02-17 MED ORDER — ZOLPIDEM TARTRATE 5 MG PO TABS
2.5000 mg | ORAL_TABLET | Freq: Every day | ORAL | Status: DC
  Administered 2023-02-17 – 2023-02-18 (×2): 2.5 mg via ORAL
  Filled 2023-02-17 (×2): qty 1

## 2023-02-17 MED ORDER — ALBUTEROL SULFATE (2.5 MG/3ML) 0.083% IN NEBU
2.5000 mg | INHALATION_SOLUTION | RESPIRATORY_TRACT | Status: DC | PRN
Start: 2023-02-17 — End: 2023-02-19
  Administered 2023-02-17: 2.5 mg via RESPIRATORY_TRACT
  Filled 2023-02-17: qty 3

## 2023-02-17 MED ORDER — VANCOMYCIN HCL IN DEXTROSE 1-5 GM/200ML-% IV SOLN
1000.0000 mg | Freq: Once | INTRAVENOUS | Status: AC
  Administered 2023-02-17: 1000 mg via INTRAVENOUS
  Filled 2023-02-17: qty 200

## 2023-02-17 MED ORDER — SODIUM CHLORIDE 0.9% FLUSH
3.0000 mL | Freq: Two times a day (BID) | INTRAVENOUS | Status: DC
  Administered 2023-02-18: 3 mL via INTRAVENOUS

## 2023-02-17 MED ORDER — SENNA 8.6 MG PO TABS: 1.0000 | ORAL_TABLET | Freq: Every day | ORAL | Status: DC | PRN

## 2023-02-17 MED ORDER — HEPARIN SODIUM (PORCINE) 5000 UNIT/ML IJ SOLN
5000.0000 [IU] | Freq: Three times a day (TID) | INTRAMUSCULAR | Status: DC
  Administered 2023-02-17 – 2023-02-19 (×5): 5000 [IU] via SUBCUTANEOUS
  Filled 2023-02-17 (×5): qty 1

## 2023-02-17 MED ORDER — SODIUM CHLORIDE 0.9 % IV BOLUS
1000.0000 mL | Freq: Once | INTRAVENOUS | Status: AC
  Administered 2023-02-17: 1000 mL via INTRAVENOUS

## 2023-02-17 NOTE — Consult Note (Addendum)
Cardiology Consultation   Patient ID: Gina Terry MRN: [id number]; DOB: 1921/06/11  Admit date: 02/17/2023 Date of Consult: 02/17/2023  PCP:  Venita Sheffield, MD   [facility] Providers Cardiologist:  Peter Swaziland, MD      Last seen 07/01/2021  Patient Profile:   Gina Terry is a 89 y.o. female with a hx of LBBB, paroxysmal Afib, dementia req 24 hr care, HTN, Hypothyroid, GI blood loss 01/2022 (workup declined).  She had a normal nuclear stress test in 2011. Echo in past apparently OK. She has been managed with rate control and Plavix or ASA, both dc'd 01/2022   She is being seen 02/17/2023 for the evaluation of rapid atrial fib at the request of Dr Hyacinth Meeker.  History of Present Illness:   Gina Terry is normally very chatty.  She is extremely demented, and is not able to assist in evaluation of her symptoms.  She has also been given sedation and does not answer questions right now or talk.  Her caregiver is present.  The caregiver says that the patient has been dealing with significant shortness of breath with any exertion and her mobility is now limited to using a wheelchair.  She gets up to go to the restroom and that is about it.  She does not complain of pain, the only answer I was able to get from her.  She is not currently short of breath, she is on oxygen but the O2 sats are not picking up well so her oxygen level is unclear.  She is most likely in [facility], because the hospice team added oxygen for the shortness of breath.  The caregivers do not check oxygen levels or heart rates, so I do not know if she has been in atrial fibrillation for a while.  Caregiver has contacted dtr, coming from [place], will be her about 11 am.  Valid DNR form at bedside.   Past Medical History:  Diagnosis Date   Aortic atherosclerosis (HCC)    Arthritis    Chronic bilateral low back pain    Colitis    Dementia (HCC)    GERD (gastroesophageal reflux disease)    History of  bone density study    History of mammogram    Hypertension, essential 04/19/2016   Hypothyroid    Left bundle branch block    Osteoporosis    Paroxysmal atrial fibrillation [facility])     Past Surgical History:  Procedure Laterality Date   APPENDECTOMY     CARDIOVASCULAR STRESS TEST  03/24/2009   EF 78%   CHOLECYSTECTOMY     COLONOSCOPY     TONSILLECTOMY AND ADENOIDECTOMY     US ECHOCARDIOGRAPHY  05/07/2008   Est EF 50-55%     Home Medications:  Prior to Admission medications   Medication Sig Start Date End Date Taking? Authorizing Provider  acetaminophen (TYLENOL) 500 MG tablet Take 500 mg by mouth every 6 (six) hours as needed for mild pain.    [provider]  amLODipine (NORVASC) 2.5 MG tablet TAKE 1 TABLET BY MOUTH EVERY DAY 12/01/21   Swaziland, Peter M, MD  calcium carbonate (TUMS - DOSED IN MG ELEMENTAL CALCIUM) 500 MG chewable tablet Chew 3 tablets by mouth daily as needed for indigestion or heartburn.    [provider]  clopidogrel (PLAVIX) 75 MG tablet Take 1 tablet (75 mg total) by mouth daily. We recommend to hold this medication until you see your PCP.Follow up in a week.  Discuss  if this needs to be continued or can be switched to aspirin 02/27/22   Burnadette Pop, MD  gabapentin (NEURONTIN) 100 MG capsule Take 1 capsule (100 mg total) by mouth daily as needed. 02/14/22   Ngetich, Dinah C, NP  levothyroxine (SYNTHROID) 50 MCG tablet Take 1 tablet (50 mcg total) by mouth daily before breakfast. 12/01/21   Frederica Kuster, MD  lidocaine (LIDODERM) 5 % Place 1 patch onto the skin daily. Remove & Discard patch within 12 hours or as directed by MD 01/26/22   Rondel Baton, MD  loperamide (IMODIUM A-D) 2 MG capsule Take 1 capsule (2 mg total) by mouth as needed for diarrhea or loose stools. 08/27/20   Ngetich, Dinah C, NP  methocarbamol (ROBAXIN) 500 MG tablet Take 0.5 tablets (250 mg total) by mouth 4 (four) times daily. 02/28/22   London Sheer, MD   metoprolol succinate (TOPROL-XL) 25 MG 24 hr tablet Take 1 tablet (25 mg total) by mouth daily. 10/28/21   [place]il[place], Peter M, MD  nitroGLYCERIN (NITROSTAT) 0.4 MG SL tablet Place 1 tablet (0.4 mg total) under the tongue every 5 (five) minutes as needed for chest pain. 02/19/19 01/31/48  [place]il[place], Peter M, MD  pantoprazole (PROTONIX) 40 MG tablet Take 1 tablet (40 mg total) by mouth 2 (two) times daily. Take 1 pill twice a day for a month then continue taking once daily 02/27/22 02/27/23  Burnadette Pop, MD  polyethylene glycol (MIRALAX / GLYCOLAX) 17 g packet Take 17 g by mouth daily as needed for mild constipation or moderate constipation.    [provider]  sucralfate (CARAFATE) 1 g tablet Take 1 tablet (1 g total) by mouth 4 (four) times daily for 14 days. 02/27/22 03/13/22  Burnadette Pop, MD  zolpidem (AMBIEN) 5 MG tablet TAKE 1 TABLET BY MOUTH EVERYDAY AT BEDTIME Patient taking differently: Take 2.5 mg by mouth at bedtime. 01/31/22   Frederica Kuster, MD    Inpatient Medications: Scheduled Meds:  Continuous Infusions:  piperacillin-tazobactam (ZOSYN)  IV 3.375 g (02/17/23 0915)   PRN Meds:   Allergies:    Allergies  Allergen Reactions   Rofecoxib     Other reaction(s): Other (See Comments) reflux, edema    Sulfamethoxazole-Trimethoprim Nausea Only   Codeine Other (See Comments)    unknown Other reaction(s): GI Upset (intolerance)   Estradiol Rash    Local rash   Flurbiprofen     Other reaction(s): GI Upset (intolerance)   Ibuprofen     Other reaction(s): GI Upset (intolerance)    Social History:   Social History   Socioeconomic History   Marital status: Widowed    Spouse name: Not on file   Number of children: 2   Years of education: Not on file   Highest education level: Not on file  Occupational History   Occupation: retired  Tobacco Use   Smoking status: Former    Current packs/day: 0.00    Types: Cigarettes    Start date: 01/30/1953    Quit date: 01/31/1984     Years since quitting: 39.0   Smokeless tobacco: Never  Vaping Use   Vaping status: Never Used  Substance and Sexual Activity   Alcohol use: Yes    Comment: 3 oz of wine before lunch   Drug use: Never   Sexual activity: Not Currently  Other Topics Concern   Not on file  Social History Narrative   Diet:Regular      Caffeine: yes  Married, if yes what year: widowed, 1956      Do you live in a house, apartment, assisted living, condo, trailer, ect: house      Is it one or more stories: basement      How many persons live in your home? 1      Pets: no      Highest level or education completed: 2 yr. 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 technologist      Exercise: no                 Type and how often:          Living Will: Yes   DNR: Yes   POA/HPOA: Yes      Functional Status:   Do you have difficulty bathing or dressing yourself? Yes   Do you have difficulty preparing food or eating? Yes   Do you have difficulty managing your medications? No   Do you have difficulty managing your finances? No   Do you have difficulty affording your medications? Yes   Social Drivers of Corporate investment banker Strain: Not on file  Food Insecurity: No Food Insecurity (02/26/2022)   Hunger Vital Sign    Worried About Running Out of Food in the Last Year: Never true    Ran Out of Food in the Last Year: Never true  Transportation Needs: No Transportation Needs (02/26/2022)   PRAPARE - Administrator, Civil Service (Medical): No    Lack of Transportation (Non-Medical): No  Physical Activity: Not on file  Stress: Not on file  Social Connections: Not on file  Intimate Partner Violence: Not At Risk (02/26/2022)   Humiliation, Afraid, Rape, and Kick questionnaire    Fear of Current or Ex-Partner: No    Emotionally Abused: No    Physically Abused: No    Sexually Abused: No    Family History:   Family History  Problem Relation Age of  Onset   Hip fracture Mother    Heart attack Father    Breast cancer Sister    Stroke Brother      ROS:  Please see the history of present illness.  All other ROS reviewed and negative.     Physical Exam/Data:   Vitals:   02/17/23 0745 02/17/23 0800 02/17/23 0815 02/17/23 0845  BP: (!) 72/52 (!) 61/46 (!) 74/56 (!) 87/75  Pulse: (!) 30   93  Resp: (!) 25 (!) 30 (!) 33 (!) 22  Temp:      TempSrc:      SpO2: 93%   93%  Weight:      Height:       No intake or output data in the 24 hours ending 02/17/23 0944    02/17/2023    7:39 AM 02/22/2022    1:40 PM 02/14/2022   12:48 PM  Last 3 Weights  Weight (lbs) 70 lb 74 lb 73 lb 12.8 oz  Weight (kg) 31.752 kg 33.566 kg 33.475 kg     Body mass index is 12.02 kg/m.  General:  cachectic, elderly female, in no acute distress HEENT: normal Neck:  JVD to jaw, pt not at 30 degrees Vascular: No carotid bruits; Distal pulses 2+ bilaterally Cardiac:  normal S1, S2; Irreg R&R; no murmur  Lungs:  decreased BS bases bilaterally, no wheezing, rhonchi or rales  Abd: soft, nontender, no hepatomegaly  Ext: no edema Musculoskeletal:  No deformities, BUE  and BLE strength not able to be tested Skin: cool and dry  Neuro:  not able to assess 2nd sedation  EKG:  The EKG was personally reviewed and demonstrates:  atrial fib, RVR, HR 156, LBBB is old Telemetry:  Telemetry was personally reviewed and demonstrates:  Afib, RVR  Relevant CV Studies:  ECHO: 07/15/2021  1. Left ventricular ejection fraction, by estimation, is 60 to 65%. The left ventricle has normal function. The left ventricle has no regional wall motion abnormalities. There is mild left ventricular hypertrophy. Left ventricular diastolic parameters  are consistent with Grade I diastolic dysfunction (impaired relaxation). Elevated left ventricular end-diastolic pressure with RVSP 38.5  2. Right ventricular systolic function is normal. The right ventricular size is normal. There is mildly  elevated pulmonary artery systolic pressure.   3. The mitral valve is abnormal. No evidence of mitral valve  regurgitation. No evidence of mitral stenosis.   4. Morphologically valve looks worse than gradients but AVA 2 cm2 and DVI 0.64 . The aortic valve is tricuspid. Aortic valve regurgitation is not visualized. Mild aortic valve stenosis.   5. The inferior vena cava is normal in size with greater than 50% respiratory variability, suggesting right atrial pressure of 3 mmHg.    Laboratory Data:  High Sensitivity Troponin:   Recent Labs  Lab 02/17/23 0635  TROPONINIHS 190*     Chemistry Recent Labs  Lab 02/17/23 0635  NA 134*  K 3.8  CL 102  CO2 19*  GLUCOSE 191*  BUN 19  CREATININE 1.30*  CALCIUM 8.8*  MG 1.9  GFRNONAA 37*  ANIONGAP 13    Recent Labs  Lab 02/17/23 0635  PROT 6.0*  ALBUMIN 3.1*  AST 46*  ALT 22  ALKPHOS 46  BILITOT 0.7   Lipids No results for input(s): "CHOL", "TRIG", "HDL", "LABVLDL", "LDLCALC", "CHOLHDL" in the last 168 hours.  Hematology Recent Labs  Lab 02/17/23 0746  WBC 21.4*  RBC 3.78*  HGB 10.5*  HCT 33.1*  MCV 87.6  MCH 27.8  MCHC 31.7  RDW 17.0*  PLT PLATELET CLUMPS NOTED ON SMEAR, UNABLE TO ESTIMATE   Thyroid No results for input(s): "TSH", "FREET4" in the last 168 hours.  BNPNo results for input(s): "BNP", "PROBNP" in the last 168 hours.  DDimer No results for input(s): "DDIMER" in the last 168 hours.   Radiology/Studies:  No results found.   Assessment and Plan:   Atrial fib, RVR - Caregiver unaware of any VS check recently, Hospice is in charge of that - no discernable sx from the Afib, ?2nd acute illness  2. Hypoxia - per EMS, O2 sats 90% on home O2 2 lpm   3. UTI - pt prev dx by Hospice team and started on unknown ABX - here, pt is on Zosyn - afebrile here but WBCs are 21.4 - UA w/ mod leuk, but few bact, lg HGB, > 300 protein  4. Hypotension - feel 2nd acute illness, Afib may contribute - pt has had  IVF, no other rx due to DNR  Once Dtr gets here, need to discuss aggressiveness of care.    Risk Assessment/Risk Scores:        CHA2DS2-VASc Score = 7   This indicates a 11.2% annual risk of stroke. The patient's score is based upon: CHF History: 0 HTN History: 1 Diabetes History: 0 Stroke History: 2 Vascular Disease History: 1 Age Score: 2 Gender Score: 1     For questions or updates, please contact [facility]  Please consult [email or web address] for contact info under    Signed, Theodore Demark, PA-C  02/17/2023 9:44 AM   Personally seen and examined. Agree with APP above with the following comments:  Gina Terry, a 89 year old patient with a history of paroxysmal atrial fibrillation, mild aortic stenosis, and left bundle branch block, has been managed conservatively with metoprolol for rate control. The dosage was reduced due to bradycardia and is currently taken as needed. The patient's aortic stenosis is likely underestimated and is probably closer to moderate. No recent ischemic workup has been conducted, and the patient has a conservative goals of care plan, including no plans for intubation, procedures, or surgery due to the risk of fall.  The patient presented with chest pain and shortness of breath in the setting of an unresolved urinary tract infection. Significant hypotension was noted, with blood pressure in the seventies to sixties. The patient is frail with a BMI of twelve, and on examination, a systolic heart murmur and regular tachycardia were noted. The patient was found to be in atrial fibrillation with a rapid ventricular response today. The left bundle branch block appears to be new but may be due to the rapid ventricular response.  The patient is an active hospice patient and was still in sinus rhythm at the last evaluation in 2023, arguing against permanent atrial fibrillation. The patient has atrial fibrillation with a rapid ventricular response and  NSTEMI complicated by chronic anemia.  The patient has significant dementia and is unable to express herself well. The patient's nighttime caregiver called EMS due to worsening pain. Daytime caregiver notes significant gas.  Exam: VITALS: P- 106, BP- 90s/60s MEASUREMENTS: BMI- 12 GENERAL: chronically ill, thin, cachectic CHEST: clear to auscultation CARDIOVASCULAR: irregular tachycardia, systolic heart murmur EXTREMITIES: no edema  LABS Hemoglobin: 10 g/dL (57/84/6962) Troponin: 190 ng/L (02/17/2023) Creatinine: 1.3 mg/dL (95/28/4132)  Tele: AF rates 150-> 100s  Atrial Fibrillation with Rapid Ventricular Response (Afib RVR)   [age]-year-old with paroxysmal atrial fibrillation, currently presenting with Afib RVR. Managed conservatively with metoprolol PRN due to bradycardia. No plans for cardioversion due to fall risk. Hypotensive with BP in the 60s-70s, making AV nodal agents unsafe. Heart rate likely compensatory. Concerns about digoxin toxicity given low BMI and age.   - Continue IV fluid resuscitation   - Monitor heart rate and blood pressure   - I recommend supportive care.  If needed IV amiodarone could be initiation but this could cause stroke.    Non-ST-Elevation Myocardial Infarction (NSTEMI)   Elevated troponin level of 190, indicating potential NSTEMI. Primary team starting IV fluids and treating for sepsis. Type II NSTEMI   Chronic Anemia   Chronic anemia with hemoglobin level of 10, complicating management of NSTEMI and Afib RVR.  She is not on [facility] after SDM to not start   Mild Aortic Stenosis   Mild aortic stenosis with normal aortic valve function. Severity likely underestimated, possibly closer to moderate.  Not a candidate for intervention   Left Bundle Branch Block (LBBB)   New left bundle branch block, possibly due to rapid ventricular response.    Urinary Tract Infection (UTI)   Presented with chest pain and shortness of breath in the setting of a UTI  contributing to sepsis and hypotension.   - discussed with primary; BP improving with IV; family is ok for medical management and conservative measures  Dementia   Significant dementia affecting ability to express herself, complicating overall management. Nighttime caregiver called EMS due to worsening pain; daytime caregiver  noted gas.   - actively in hospice  Recommend conservative management. Will sign off.  Riley Lam, MD FASE [facility] Cardiologist [facility]  [address] [place], [address] [place], [place] [address] [phone]  11:09 AM

## 2023-02-17 NOTE — Progress Notes (Signed)
Pharmacy Antibiotic Note  Gina Terry is a 89 y.o. female admitted on 02/17/2023 with pneumonia.  Pharmacy has been consulted for Zosyn dosing.  SCr up 1.30 (baseline ~0.8-1). LA up 4.5. Low temp 96.8.   Plan: Zosyn 3.375g IV every 12 hours - extended infusion.  Monitor renal function to further adjust as needed.  Monitor clinical status and culture results.   Height: 5\' 4"  (162.6 cm) Weight: 31.8 kg (70 lb) IBW/kg (Calculated) : 54.7  Temp (24hrs), Avg:96.8 F (36 C), Min:96.8 F (36 C), Max:96.8 F (36 C)  Recent Labs  Lab 02/17/23 0635 02/17/23 0730  CREATININE 1.30*  --   LATICACIDVEN  --  4.5*    Estimated Creatinine Clearance: 11.8 mL/min (A) (by C-G formula based on SCr of 1.3 mg/dL (H)).    Allergies  Allergen Reactions   Rofecoxib     Other reaction(s): Other (See Comments) reflux, edema    Sulfamethoxazole-Trimethoprim Nausea Only   Codeine Other (See Comments)    unknown Other reaction(s): GI Upset (intolerance)   Estradiol Rash    Local rash   Flurbiprofen     Other reaction(s): GI Upset (intolerance)   Ibuprofen     Other reaction(s): GI Upset (intolerance)    Antimicrobials this admission: Zosyn 1/18 >> Vanc 1/18 x1  Dose adjustments this admission:   Microbiology results: 1/18 BCx:   Thank you for allowing pharmacy to be a part of this patient's care.  Link Snuffer, PharmD, BCPS, BCCCP Please refer to [facility] for [facility] Pharmacy numbers 02/17/2023 8:14 AM

## 2023-02-17 NOTE — Progress Notes (Signed)
[facility] liaison note     This patient is a current hospice patient with [facility].    Liaison will continue to follow for any discharge planning needs and to coordinate continuation of hospice care.    Please don't hesitate to call with any Hospice related questions or concerns.    Thank you for the opportunity to participate in this patient's care.  Glenna Fellows, BSN, RN, OCN [facility] [phone]  Below is our current medication list

## 2023-02-17 NOTE — H&P (Addendum)
History and Physical    Patient: Gina Terry QIO:[id number] DOB: 1921-03-12 DOA: 02/17/2023 DOS: the patient was seen and examined on 02/17/2023 PCP: Venita Sheffield, MD  Patient coming from: Home via EMS  Chief Complaint:  Chief Complaint  Patient presents with   Altered Mental Status   HPI: Gina Terry is a 89 y.o. female with medical history significant of paroxysmal atrial fibrillation, left bundle branch block, chronic respiratory failure on 2 L oxygen, hypothyroidism, dementia, osteoporosis, GI bleed, and GERD presents with complaints of chest pain.   Much of history is obtained from her caregiver present at bedside.  At baseline patient lives alone and has 24-hour care.  Apparently the patient complained of chest pain earlier this morning.  Reportedly she often has complaints of chest pain, but normally is attributed to gas.  This morning pain seemed to persist and was stated to be across her chest.  Caregiver had tried giving the patient nitroglycerin without improvement in symptoms.  She had just recently been diagnosed with a urinary tract infection and started on ciprofloxacin earlier this week.  Patient also has chronic issues with pain in her back near her shoulder blade.  Records note patient previously hospitalized back in 01/2022 where patient was noted to have an upper GI bleed for which she was managed conservatively with Protonix long with holding Plavix and aspirin.  She was also during that time to have a pansensitive Pseudomonas infection.  Family was not interested in any aggressive intervention such as EGD at that time.  Discussed with patient's daughter over the phone as the patient currently is under hospice, agrees with current treatment of UTI and does not want any aggressive measures.  Upon admission into the emergency department patient was noted to have a temperature 96.8 F with heart rates atrial fibrillation, respirations 19-33, blood pressure systolic  61/46, and O2 saturations noted to be as low as 84% on room air currently maintained on nonrebreather at 10 L.  Labs significant for WBC 21.4, hemoglobin 10.5, platelet count being, sodium 134, CO2 19, BUN 19, creatinine 1.3, AST 46, ALT 22, high-sensitivity troponin 190->600, and lactic acid 4.5->4 urinalysis positive for large hemoglobin, moderate leukocytes, few bacteria, 6-10 RBC/hpf, and 11-20 WBCs.  Patient had been given empiric antibiotics of vancomycin and Zosyn.  Cardiology had been formally consulted.  Review of Systems: As mentioned in the history of present illness. All other systems reviewed and are negative. Past Medical History:  Diagnosis Date   Aortic atherosclerosis (HCC)    Arthritis    Chronic bilateral low back pain    Colitis    Dementia (HCC)    GERD (gastroesophageal reflux disease)    History of bone density study    History of mammogram    Hypertension, essential 04/19/2016   Hypothyroid    Left bundle branch block    Osteoporosis    Paroxysmal atrial fibrillation [facility])    Past Surgical History:  Procedure Laterality Date   APPENDECTOMY     CARDIOVASCULAR STRESS TEST  03/24/2009   EF 78%   CHOLECYSTECTOMY     COLONOSCOPY     TONSILLECTOMY AND ADENOIDECTOMY     US ECHOCARDIOGRAPHY  05/07/2008   Est EF 50-55%   Social History:  reports that she quit smoking about 39 years ago. Her smoking use included cigarettes. She started smoking about 70 years ago. She has never used smokeless tobacco. She reports current alcohol use. She reports that she does not use drugs.  Allergies  Allergen Reactions   Rofecoxib     Other reaction(s): Other (See Comments) reflux, edema    Sulfamethoxazole-Trimethoprim Nausea Only   Codeine Other (See Comments)    unknown Other reaction(s): GI Upset (intolerance)   Estradiol Rash    Local rash   Flurbiprofen     Other reaction(s): GI Upset (intolerance)   Ibuprofen     Other reaction(s): GI Upset (intolerance)     Family History  Problem Relation Age of Onset   Hip fracture Mother    Heart attack Father    Breast cancer Sister    Stroke Brother     Prior to Admission medications   Medication Sig Start Date End Date Taking? Authorizing Provider  acetaminophen (TYLENOL) 500 MG tablet Take 500 mg by mouth every 6 (six) hours as needed for mild pain.    [provider]  amLODipine (NORVASC) 2.5 MG tablet TAKE 1 TABLET BY MOUTH EVERY DAY 12/01/21   [place], Peter M, MD  calcium carbonate (TUMS - DOSED IN MG ELEMENTAL CALCIUM) 500 MG chewable tablet Chew 3 tablets by mouth daily as needed for indigestion or heartburn.    [provider]  clopidogrel (PLAVIX) 75 MG tablet Take 1 tablet (75 mg total) by mouth daily. We recommend to hold this medication until you see your PCP.Follow up in a week.  Discuss if this needs to be continued or can be switched to aspirin 02/27/22   Burnadette Pop, MD  gabapentin (NEURONTIN) 100 MG capsule Take 1 capsule (100 mg total) by mouth daily as needed. 02/14/22   Ngetich, Dinah C, NP  levothyroxine (SYNTHROID) 50 MCG tablet Take 1 tablet (50 mcg total) by mouth daily before breakfast. 12/01/21   Frederica Kuster, MD  lidocaine (LIDODERM) 5 % Place 1 patch onto the skin daily. Remove & Discard patch within 12 hours or as directed by MD 01/26/22   Rondel Baton, MD  loperamide (IMODIUM A-D) 2 MG capsule Take 1 capsule (2 mg total) by mouth as needed for diarrhea or loose stools. 08/27/20   Ngetich, Dinah C, NP  methocarbamol (ROBAXIN) 500 MG tablet Take 0.5 tablets (250 mg total) by mouth 4 (four) times daily. 02/28/22   London Sheer, MD  metoprolol succinate (TOPROL-XL) 25 MG 24 hr tablet Take 1 tablet (25 mg total) by mouth daily. 10/28/21   [place], Peter M, MD  nitroGLYCERIN (NITROSTAT) 0.4 MG SL tablet Place 1 tablet (0.4 mg total) under the tongue every 5 (five) minutes as needed for chest pain. 02/19/19 01/31/48  [place], Peter M, MD  pantoprazole  (PROTONIX) 40 MG tablet Take 1 tablet (40 mg total) by mouth 2 (two) times daily. Take 1 pill twice a day for a month then continue taking once daily 02/27/22 02/27/23  Burnadette Pop, MD  polyethylene glycol (MIRALAX / GLYCOLAX) 17 g packet Take 17 g by mouth daily as needed for mild constipation or moderate constipation.    [provider]  sucralfate (CARAFATE) 1 g tablet Take 1 tablet (1 g total) by mouth 4 (four) times daily for 14 days. 02/27/22 03/13/22  Burnadette Pop, MD  zolpidem (AMBIEN) 5 MG tablet TAKE 1 TABLET BY MOUTH EVERYDAY AT BEDTIME Patient taking differently: Take 2.5 mg by mouth at bedtime. 01/31/22   Frederica Kuster, MD    Physical Exam: Vitals:   02/17/23 0915 02/17/23 0930 02/17/23 0945 02/17/23 1015  BP: (!) 82/63 92/62 (!) 82/53 91/69  Pulse: (!) 112 (!) 113 Marland Kitchen)  47 (!) 114  Resp: (!) 23 19 20 20   Temp:      TempSrc:      SpO2: (!) 84% 94% (!) 89% 99%  Weight:      Height:        Constitutional: Elderly female who appears to be ill and in distress Eyes: PERRL, lids and conjunctivae normal ENMT: Mucous membranes are dry.  Hard of hearing Neck: normal, supple  Respiratory: Tachypneic with rales heard in the lower lung fields.  Patient currently on nonrebreather at 10 L with O2 saturation maintained.. Cardiovascular: Irregular irregular and tachycardic.  No lower extremity swelling.  Distal pulses 2+. Abdomen: no tenderness, no masses palpated.  Bowel sounds positive.  Musculoskeletal: no clubbing / cyanosis. No joint deformity upper and lower extremities. Good ROM, no contractures. Normal muscle tone.  Skin: no rashes, lesions, ulcers. No induration Neurologic: CN 2-12 grossly intact. Sensation intact, DTR normal. Strength 5/5 in all 4.  Psychiatric: Normal judgment and insight. Alert and oriented x 3. Normal mood.    Data Reviewed:  EKG revealed atrial fibrillation with RVR at 156 bpm.  Reviewed labs, imaging, and pertinent records as  indicated  Assessment and Plan:  Suspected severe sepsis secondary to UTI with concern for septic shock On admission into the emergency department patient was noted to have temperature of 96.8 F with tachycardia and tachypnea Batten blood cell count elevated at 21.4 meeting SIRS criteria.  She had recently been started on ciprofloxacin earlier this week for UTI.  Urinalysis significant for large hemoglobin, moderate leukocytes, greater than 300 protein, few bacteria, and 11-20 WBCs.  Suspect UTI as the likely source of infection.  He has an initially elevated up to 4.5 with repeat check for after only 1 L of normal saline IV fluids.  Blood and urine cultures were ordered.  Patient has been started on empiric antibiotics of vancomycin and cefepime. -Admit to a progressive bed -Follow-up blood and urine cultures -Continue empiric antibiotics of Zosyn -Given additional 500 mL of normal saline IV fluids and then placed on a rate -Tylenol as needed for fever -Trend lactic acid levels -Recheck CBC tomorrow  Acute on chronic respiratory failure with hypoxia Possible community-acquired pneumonia Acute.  Patient is normally on 2 L of nasal cannula oxygen, but currently requiring 10 L via facemask to maintain O2 saturations.  Patient noted to have some rales in the lower lung fields.  Question the possibility of pneumonia or aspiration. -Continuous pulse oximetry with oxygen maintain O2 saturation greater than 90% -Check procalcitonin -Check CT angiogram of the chest -Continue antibiotics as noted above  Transient hypotension Acute.  Initial blood pressures noted to be as low as 61/46.  Patient had been bolused 1 L normal saline IV fluids.  Blood pressures are currently maintained with maps greater than 65. -Goal MAP greater than 65 -Bolus an additional 500 mL of normal saline fluids.  Adjust fluids as needed  Elevated troponin Acute.  High-sensitivity troponins 190-> 606.  Family wishes no  aggressive intervention.  Possibly secondary to demand in setting of severe sepsis.  Paroxysmal atrial fibrillation Patient noted to be in A-fib with RVR with heart rates elevated up to 156.  Currently, but not on anticoagulation as has a history of prior GI bleed.   -Holding metoprolol due to hypotension -Goal potassium at least 4 and magnesium at least 2 -Appreciate cardiology consultative services, will follow-up for any further recommendations  Acute kidney injury Creatinine noted to be elevated up to 1.31  with BUN 19.  Baseline previously had been 0.81 when checked on 02/27/2022 -Avoid possible nephrotoxic agents -Continue normal saline IV fluids at 75 mL/h -Recheck kidney function in a.m.  Hypothyroidism Records note patient does not appear to have recently refilled her thyroid medications.  Last check TSH was noted to be elevated over a year ago.  Uncontrolled hypothyroidism could be a factor in patient's atrial fibrillation. -Check TSH -Resume levothyroxine low-dose if patient has not been taking this medication.  Adjust dose as medically appropriate  Normocytic anemia Chronic.  Hemoglobin 10.5 which appears around patient's baseline -Recheck CBC tomorrow morning  Dementia Patient noted usually be alert and oriented to person and place and able to recognize caregivers. -Delirium precautions  Underweight BMI 12.02 kg/m -Add-on prealbumin  GERD History of gastritis -Continue Protonix  Hospice Patient appears to be currently in hospice services. -Continue discussions with daughter based on patient's clinical status  DVT prophylaxis: Lovenox Advance Care Planning:   Code Status: Limited: Do not attempt resuscitation (DNR) -DNR-LIMITED -Do Not Intubate/DNI     Consults: Cardiology  Family Communication: Daughter updated who is POA  Severity of Illness: The appropriate patient status for this patient is INPATIENT. Inpatient status is judged to be reasonable and  necessary in order to provide the required intensity of service to ensure the patient's safety. The patient's presenting symptoms, physical exam findings, and initial radiographic and laboratory data in the context of their chronic comorbidities is felt to place them at high risk for further clinical deterioration. Furthermore, it is not anticipated that the patient will be medically stable for discharge from the hospital within 2 midnights of admission.   * I certify that at the point of admission it is my clinical judgment that the patient will require inpatient hospital care spanning beyond 2 midnights from the point of admission due to high intensity of service, high risk for further deterioration and high frequency of surveillance required.*  Author: Clydie Braun, MD 02/17/2023 10:45 AM  For on call review [email or web address].

## 2023-02-17 NOTE — Progress Notes (Signed)
[facility] Patient Visit   Ms. 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 She was admitted 02/16/22 with diagnosis of Sepsis. [facility] was notified of arrival to the hospital by the hospital. Per Dr. Patric Dykes, hospice physician, this is a related hospital admission.    Visited patient in ED with daughter and son in law at bedside. She reports feeling better than when she arrived and is in no acute distress. She is sitting up in bed eating a small amount of a meal. Discussed plan of care with daughter.    Patient is GIP appropriate for evaluation and management of infection, likely sepsis, including IV fluids and IV antibiotics.     Vital Signs: 97/107/19   91/56   O2 95% on 6LPM via [place]   I&O: not documented   Abnormal labs: NA 134, CO2 19, Cre 1.3, CA 8.8, Alb 3.1, Trop 190, 606, BC x 2 pending, UA positive, Urine Culture pending.    Diagnostics: CT Angio MPRESSION: 1. Negative for acute pulmonary embolism. 2. Small bilateral pleural effusions. 3. Airspace opacities in the dependent upper and lower lobes bilaterally. Likely at least in part due to atelectasis however pneumonia and/or aspiration is difficult to exclude. 4. Reflux of contrast into the IVC and hepatic veins compatible with elevated right heart pressures. 5. Severe compression fractures of T8 and T10 are presumed chronic however age indeterminate without comparison.   Aortic Atherosclerosis (ICD10-I70.0) and Emphysema (ICD10-J43.9).   IV/PRN Meds: Ativan 0.5 iv x1, Zofran 4mg  IV x 1, NS 13ml/hr, Zosyn 3.375 IV x 1, Vancomycin 1000mg  IV x 1, NS bolus     Assessment and Plan: from 1.18 Suspected severe sepsis secondary to UTI with concern for septic shock On admission into the emergency department patient was noted to have temperature of 96.8 F with tachycardia and tachypnea Maners blood cell count elevated at 21.4  meeting SIRS criteria.  She had recently been started on ciprofloxacin earlier this week for UTI.  Urinalysis significant for large hemoglobin, moderate leukocytes, greater than 300 protein, few bacteria, and 11-20 WBCs.  Suspect UTI as the likely source of infection.  He has an initially elevated up to 4.5 with repeat check for after only 1 L of normal saline IV fluids.  Blood and urine cultures were ordered.  Patient has been started on empiric antibiotics of vancomycin and cefepime. -Admit to a progressive bed -Follow-up blood and urine cultures -Continue empiric antibiotics of Zosyn -Given additional 500 mL of normal saline IV fluids and then placed on a rate -Tylenol as needed for fever -Trend lactic acid levels -Recheck CBC tomorrow   Acute on chronic respiratory failure with hypoxia Possible community-acquired pneumonia Acute.  Patient is normally on 2 L of nasal cannula oxygen, but currently requiring 10 L via facemask to maintain O2 saturations.  Patient noted to have some rales in the lower lung fields.  Question the possibility of pneumonia or aspiration. -Continuous pulse oximetry with oxygen maintain O2 saturation greater than 90% -Check procalcitonin -Check CT angiogram of the chest -Continue antibiotics as noted above   Transient hypotension Acute.  Initial blood pressures noted to be as low as 61/46.  Patient had been bolused 1 L normal saline IV fluids.  Blood pressures are currently maintained with maps greater than 65. -Goal MAP greater than 65 -Bolus an additional 500 mL of normal saline fluids.  Adjust fluids as needed   Elevated troponin Acute.  High-sensitivity troponins 190-> 606.  Family wishes no aggressive intervention.  Possibly secondary to demand in setting of severe sepsis.   Paroxysmal atrial fibrillation Patient noted to be in A-fib with RVR with heart rates elevated up to 156.  Currently, but not on anticoagulation as has a history of prior GI bleed.    -Holding metoprolol due to hypotension -Goal potassium at least 4 and magnesium at least 2 -Appreciate cardiology consultative services, will follow-up for any further recommendations   Acute kidney injury Creatinine noted to be elevated up to 1.31 with BUN 19.  Baseline previously had been 0.81 when checked on 02/27/2022 -Avoid possible nephrotoxic agents -Continue normal saline IV fluids at 75 mL/h -Recheck kidney function in a.m.   Dementia Patient noted usually be alert and oriented to person and place and able to recognize caregivers. -Delirium precautions   Discharge Planning: Ongoing   Family Contact: with daughter at bedside   IDT: Updated   Goals of Care: DNR   Glenna Fellows BSN, RN, [facility] hospital liaison [phone]

## 2023-02-17 NOTE — ED Provider Notes (Signed)
Critically ill-appearing 89 year old female presents with chest pain shortness of breath and A-fib with RVR.  History of paroxysmal A-fib with RVR, history of left bundle branch block, according to the caregiver who is here at the bedside she is no longer on anticoagulation and does not take any rate control medications.  Last cardiology visit was about a year and a half ago.  She has a blood pressure of 75 systolic at this time, she is getting IV fluids, her oxygen is 87%, she was recently diagnosed with a UTI and is on Cipro within the last few days.  The patient answer simple questions but has some underlying dementia, when I ask her about her CODE STATUS she is not clear about what those questions mean but she does come with a DO NOT RESUSCITATE form that is fully signed.  Will consult with cardiology regarding the new onset nature of the hypotensive patient in A-fib, she is afebrile, her rate is in the 140-160 range, she is getting IV fluids  Final diagnoses:  None      Eber Hong, MD 02/18/23 [phone]

## 2023-02-17 NOTE — ED Triage Notes (Addendum)
Pt BIB [facility] from home. EMS called out for chest pain. Pt has a home nurse who said at 0400 pt started c/o CP. Pt was given 1 nitroglycerin at 0400 and 2nd a 0405. Being treated w/ antibiotics for a UTI.  Initially in [facility] w/ EMS, 100-180P. Pt denies any pain. GCS 13. On 2L [place] at baseline.  EMS 98/54, 78/44, 64/34 BP 90% 2LNC 250ccLR 20LFA

## 2023-02-17 NOTE — ED Provider Notes (Signed)
[place] EMERGENCY DEPARTMENT AT [facility] Provider Note   CSN: [id number] Arrival date & time: 02/17/23  7829     History  Chief Complaint  Patient presents with   Altered Mental Status    Gina Terry is a 89 y.o. female with medical history of paroxysmal A-fib no longer on anticoagulation, dementia, colitis, chronic bilateral low back pain, hypertension, hypothyroid, left bundle branch block.  Patient presents to ED for evaluation of chest pain, shortness of breath.  Patient home health aide provides history.  Patient home health aide states that the patient woke up around 4 AM complaining of chest pain.  Patient home health aide give patient 2 nitroglycerin 5 minutes apart with no relief of chest pain.  Patient complaining of shortness of breath as well, chronically on 2 L of oxygen via nasal cannula.  Patient home health aide denies any increase in her oxygen recently.  Patient arrives complaining of nausea without vomiting.  Apparently patient has been diagnosed with UTI on Tuesday and has been taking ciprofloxacin. Home health aide denies any fevers at home. Patient home health aide reports that the patient is no longer taking anticoagulation.  Patient is a DNR.  Alert to baseline. Patient has DNR at bedside fully signed.    Altered Mental Status      Home Medications Prior to Admission medications   Medication Sig Start Date End Date Taking? Authorizing Provider  acetaminophen (TYLENOL) 500 MG tablet Take 500 mg by mouth every 6 (six) hours as needed for mild pain.    [provider]  amLODipine (NORVASC) 2.5 MG tablet TAKE 1 TABLET BY MOUTH EVERY DAY 12/01/21   [place], Peter M, MD  calcium carbonate (TUMS - DOSED IN MG ELEMENTAL CALCIUM) 500 MG chewable tablet Chew 3 tablets by mouth daily as needed for indigestion or heartburn.    [provider]  clopidogrel (PLAVIX) 75 MG tablet Take 1 tablet (75 mg total) by mouth daily. We recommend to hold  this medication until you see your PCP.Follow up in a week.  Discuss if this needs to be continued or can be switched to aspirin 02/27/22   Burnadette Pop, MD  gabapentin (NEURONTIN) 100 MG capsule Take 1 capsule (100 mg total) by mouth daily as needed. 02/14/22   Ngetich, Dinah C, NP  levothyroxine (SYNTHROID) 50 MCG tablet Take 1 tablet (50 mcg total) by mouth daily before breakfast. 12/01/21   Frederica Kuster, MD  lidocaine (LIDODERM) 5 % Place 1 patch onto the skin daily. Remove & Discard patch within 12 hours or as directed by MD 01/26/22   Rondel Baton, MD  loperamide (IMODIUM A-D) 2 MG capsule Take 1 capsule (2 mg total) by mouth as needed for diarrhea or loose stools. 08/27/20   Ngetich, Dinah C, NP  methocarbamol (ROBAXIN) 500 MG tablet Take 0.5 tablets (250 mg total) by mouth 4 (four) times daily. 02/28/22   London Sheer, MD  metoprolol succinate (TOPROL-XL) 25 MG 24 hr tablet Take 1 tablet (25 mg total) by mouth daily. 10/28/21   [place], Peter M, MD  nitroGLYCERIN (NITROSTAT) 0.4 MG SL tablet Place 1 tablet (0.4 mg total) under the tongue every 5 (five) minutes as needed for chest pain. 02/19/19 01/31/48  [place], Peter M, MD  pantoprazole (PROTONIX) 40 MG tablet Take 1 tablet (40 mg total) by mouth 2 (two) times daily. Take 1 pill twice a day for a month then continue taking once daily 02/27/22 02/27/23  Burnadette Pop, MD  polyethylene glycol (MIRALAX / GLYCOLAX) 17 g packet Take 17 g by mouth daily as needed for mild constipation or moderate constipation.    [provider]  sucralfate (CARAFATE) 1 g tablet Take 1 tablet (1 g total) by mouth 4 (four) times daily for 14 days. 02/27/22 03/13/22  Burnadette Pop, MD  zolpidem (AMBIEN) 5 MG tablet TAKE 1 TABLET BY MOUTH EVERYDAY AT BEDTIME Patient taking differently: Take 2.5 mg by mouth at bedtime. 01/31/22   Frederica Kuster, MD      Allergies    Rofecoxib, Sulfamethoxazole-trimethoprim, Codeine, Estradiol, Flurbiprofen, and  Ibuprofen    Review of Systems   Review of Systems  Unable to perform ROS: Dementia (Level 5 caveat)  All other systems reviewed and are negative.   Physical Exam Updated Vital Signs BP (!) 88/57   Pulse (!) 113   Temp (!) 96.8 F (36 C) (Axillary)   Resp 19   Ht 5\' 4"  (1.626 m)   Wt 31.8 kg   SpO2 97%   BMI 12.02 kg/m  Physical Exam Vitals and nursing note reviewed.  Constitutional:      Appearance: She is ill-appearing and toxic-appearing.     Comments: Cachectic appearing 89 year old female  HENT:     Head: Normocephalic and atraumatic.     Mouth/Throat:     Mouth: Mucous membranes are moist.     Pharynx: Oropharynx is clear.  Eyes:     Extraocular Movements: Extraocular movements intact.     Pupils: Pupils are equal, round, and reactive to light.  Cardiovascular:     Rate and Rhythm: Tachycardia present.  Pulmonary:     Effort: Pulmonary effort is normal.     Breath sounds: Normal breath sounds. No wheezing.  Abdominal:     General: Abdomen is flat.     Tenderness: There is no abdominal tenderness.  Musculoskeletal:     Cervical back: Normal range of motion and neck supple. No tenderness.  Skin:    General: Skin is warm and dry.     Capillary Refill: Capillary refill takes less than 2 seconds.  Neurological:     Mental Status: Mental status is at baseline.     ED Results / Procedures / Treatments   Labs (all labs ordered are listed, but only abnormal results are displayed) Labs Reviewed  COMPREHENSIVE METABOLIC PANEL - Abnormal; Notable for the following components:      Result Value   Sodium 134 (*)    CO2 19 (*)    Glucose, Bld 191 (*)    Creatinine, Ser 1.30 (*)    Calcium 8.8 (*)    Total Protein 6.0 (*)    Albumin 3.1 (*)    AST 46 (*)    GFR, Estimated 37 (*)    All other components within normal limits  URINALYSIS, ROUTINE W REFLEX MICROSCOPIC - Abnormal; Notable for the following components:   APPearance CLOUDY (*)    Hgb urine dipstick  LARGE (*)    Protein, ur >300 (*)    Leukocytes,Ua MODERATE (*)    All other components within normal limits  CBC WITH DIFFERENTIAL/PLATELET - Abnormal; Notable for the following components:   WBC 21.4 (*)    RBC 3.78 (*)    Hemoglobin 10.5 (*)    HCT 33.1 (*)    RDW 17.0 (*)    Neutro Abs 11.5 (*)    Monocytes Absolute 6.7 (*)    Abs Immature Granulocytes 1.11 (*)  All other components within normal limits  URINALYSIS, MICROSCOPIC (REFLEX) - Abnormal; Notable for the following components:   Bacteria, UA FEW (*)    All other components within normal limits  I-STAT CG4 LACTIC ACID, ED - Abnormal; Notable for the following components:   Lactic Acid, Venous 4.5 (*)    All other components within normal limits  I-STAT CG4 LACTIC ACID, ED - Abnormal; Notable for the following components:   Lactic Acid, Venous 4.0 (*)    All other components within normal limits  TROPONIN I (HIGH SENSITIVITY) - Abnormal; Notable for the following components:   Troponin I (High Sensitivity) 190 (*)    All other components within normal limits  TROPONIN I (HIGH SENSITIVITY) - Abnormal; Notable for the following components:   Troponin I (High Sensitivity) 606 (*)    All other components within normal limits  CULTURE, BLOOD (ROUTINE X 2)  CULTURE, BLOOD (ROUTINE X 2)  URINE CULTURE  LIPASE, BLOOD  MAGNESIUM  CBC WITH DIFFERENTIAL/PLATELET    EKG None  Radiology No results found.  Procedures .Critical Care  Performed by: Al Decant, PA-C Authorized by: Al Decant, PA-C   Critical care provider statement:    Critical care time (minutes):  75   Critical care time was exclusive of:  Separately billable procedures and treating other patients   Critical care was necessary to treat or prevent imminent or life-threatening deterioration of the following conditions:  Sepsis, shock and respiratory failure   Critical care was time spent personally by me on the following activities:   Blood draw for specimens, development of treatment plan with patient or surrogate, discussions with consultants, evaluation of patient's response to treatment, discussions with primary provider, examination of patient, obtaining history from patient or surrogate, interpretation of cardiac output measurements, ordering and performing treatments and interventions, ordering and review of laboratory studies, ordering and review of radiographic studies, pulse oximetry, re-evaluation of patient's condition and review of old charts   I assumed direction of critical care for this patient from another provider in my specialty: no     Care discussed with: admitting provider      Medications Ordered in ED Medications  piperacillin-tazobactam (ZOSYN) IVPB 3.375 g (0 g Intravenous Stopped 02/17/23 0947)  sodium chloride 0.9 % bolus 500 mL (has no administration in time range)  sodium chloride 0.9 % bolus 1,000 mL (0 mLs Intravenous Stopped 02/17/23 0749)  ondansetron (ZOFRAN) injection 4 mg (4 mg Intravenous Given 02/17/23 0706)  vancomycin (VANCOCIN) IVPB 1000 mg/200 mL premix (0 mg Intravenous Stopped 02/17/23 0911)  LORazepam (ATIVAN) injection 0.5 mg (0.5 mg Intravenous Given 02/17/23 9147)    ED Course/ Medical Decision Making/ A&P Clinical Course as of 02/17/23 1108  Sat Feb 17, 2023  0812 Hey, in uncontrolled rhythm. We're talking to cardiology right now to determine what methods to control the rate of her heart are avialable that dont include shocking her, but its a pretty serious situation. [CG]  0910 Most likely 2/2 to underlying sepsis. Cardiology will come see patient, provide recommendations. Levophed if indicated for pressure. Treat sepsis. Possibly amiodarone. [CG]  1021 Admit for septic shock in palliative patient secondary to UTI. A fib a manifestation of her sepsis, most likely driver. Lactic to 4.5 --> 4.0 after fluid. Still in A fib. Diagnosed with UTI on Tuesday, been on cipro. WBC to 21.4  here. Urine with moderate luekocytes. Patient DNR so discussed with cardiology who recommends giving levophed if persistently hypotensive, treat sepsis,  possibly provide amiodarone.  [CG]  1033 No central line. Discussed with daughter who is on the way from [place].  [CG]    Clinical Course User Index [CG] Al Decant, PA-C    Medical Decision Making  89 year old female presents for evaluation.  Please see HPI for further details.  On exam patient is afebrile, tachycardic with pulse balance between 120 and 160 on the monitor.  The patient is in A-fib with RVR.  Her lung sounds are clear bilaterally, she is chronically on 2 L of oxygen via nasal cannula.  Abdomen soft and compressible.  Neurological examination is at baseline.  Patient extremely cachectic in appearance.  Overall she is toxic in appearance.  Patient apparently was been diagnosed with UTI on Tuesday.  Her home health aide at the bedside reports compliance on antibiotic regimen, ciprofloxacin.  Patient appears to be in A-fib with RVR on the monitor.  She is afebrile on arrival.  Will collect blood cultures, i-STAT lactic's, CBC, CMP with lipase, magnesium, urinalysis, troponin.  Patient CBC with leukocytosis to 21.4, hemoglobin 10.5 which is baseline.  Metabolic panel with sodium 134, bicarb 19, creatinine 1.3 which is elevated from previous creatinines.  Patient most likely has AKI.  Patient lipase WNL.  Magnesium WNL.  Urinalysis shows moderate leukocytes, protein, large hemoglobin.  Will culture patient urine.  Lactic acid 4.5, decreased to 4 with 1 L of fluid.  Troponin 150, 606 on delta.  This could be demand ischemia secondary to her A-fib with RVR.  Consulted cardiology who came to the patient bedside.  They believe that her A-fib is secondary to underlying sepsis.  They have suggested Levophed for hypotension as needed, treat the sepsis and possibly providing amiodarone for rate control.  Reached out to the patient  daughter, Erskine Squibb.  Discussed severity of patient illness with daughter.  Patient daughter is denying need for central at this time, she does not believe that her mother would want this intervention performed.  Patient received 4 mg of Zofran for nausea, 0.5 mg of Ativan for sedation, vancomycin and Zosyn for underlying urosepsis.  Patient will require admission to the hospital.  Discussed with Dr. Katrinka Blazing of [facility] hospitalist service who has agreed to admit the patient.   Final Clinical Impression(s) / ED Diagnoses Final diagnoses:  Sepsis, due to unspecified organism, unspecified whether acute organ dysfunction present [facility])  Urinary tract infection with hematuria, site unspecified  Atrial fibrillation with RVR [facility])    Rx / DC Orders ED Discharge Orders     None         Clent Ridges 02/17/23 1108    Eber Hong, MD 02/18/23 [phone]

## 2023-02-18 DIAGNOSIS — R7989 Other specified abnormal findings of blood chemistry: Secondary | ICD-10-CM

## 2023-02-18 DIAGNOSIS — J189 Pneumonia, unspecified organism: Secondary | ICD-10-CM

## 2023-02-18 DIAGNOSIS — A419 Sepsis, unspecified organism: Secondary | ICD-10-CM

## 2023-02-18 DIAGNOSIS — R6520 Severe sepsis without septic shock: Secondary | ICD-10-CM

## 2023-02-18 DIAGNOSIS — J9621 Acute and chronic respiratory failure with hypoxia: Secondary | ICD-10-CM | POA: Diagnosis not present

## 2023-02-18 LAB — CBC
HCT: 32.1 % — ABNORMAL LOW (ref 36.0–46.0)
Hemoglobin: 10.2 g/dL — ABNORMAL LOW (ref 12.0–15.0)
MCH: 27.8 pg (ref 26.0–34.0)
MCHC: 31.8 g/dL (ref 30.0–36.0)
MCV: 87.5 fL (ref 80.0–100.0)
Platelets: 222 10*3/uL (ref 150–400)
RBC: 3.67 MIL/uL — ABNORMAL LOW (ref 3.87–5.11)
RDW: 17.2 % — ABNORMAL HIGH (ref 11.5–15.5)
WBC: 23.1 10*3/uL — ABNORMAL HIGH (ref 4.0–10.5)
nRBC: 0 % (ref 0.0–0.2)

## 2023-02-18 LAB — RESPIRATORY PANEL BY PCR

## 2023-02-18 LAB — SARS CORONAVIRUS 2 BY RT PCR: SARS Coronavirus 2 by RT PCR: NEGATIVE

## 2023-02-18 LAB — PROCALCITONIN: Procalcitonin: <0.1 ng/mL

## 2023-02-18 LAB — COMPREHENSIVE METABOLIC PANEL
ALT: 48 U/L — ABNORMAL HIGH (ref 0–44)
AST: 111 U/L — ABNORMAL HIGH (ref 15–41)
Albumin: 3 g/dL — ABNORMAL LOW (ref 3.5–5.0)
Alkaline Phosphatase: 50 U/L (ref 38–126)
Anion gap: 8 (ref 5–15)
BUN: 21 mg/dL (ref 8–23)
CO2: 22 mmol/L (ref 22–32)
Calcium: 8.3 mg/dL — ABNORMAL LOW (ref 8.9–10.3)
Chloride: 103 mmol/L (ref 98–111)
Creatinine, Ser: 1.15 mg/dL — ABNORMAL HIGH (ref 0.44–1.00)
GFR, Estimated: 43 mL/min — ABNORMAL LOW (ref >60)
Glucose, Bld: 100 mg/dL — ABNORMAL HIGH (ref 70–99)
Potassium: 3.9 mmol/L (ref 3.5–5.1)
Sodium: 133 mmol/L — ABNORMAL LOW (ref 135–145)
Total Bilirubin: 0.8 mg/dL (ref 0.0–1.2)
Total Protein: 5.9 g/dL — ABNORMAL LOW (ref 6.5–8.1)

## 2023-02-18 LAB — LACTIC ACID, PLASMA: Lactic Acid, Venous: 2.9 mmol/L (ref 0.5–1.9)

## 2023-02-18 LAB — MRSA NEXT GEN BY PCR, NASAL: MRSA by PCR Next Gen: NOT DETECTED

## 2023-02-18 MED ORDER — QUETIAPINE FUMARATE 25 MG PO TABS: 25.0000 mg | ORAL_TABLET | Freq: Every day | ORAL | Status: DC | PRN

## 2023-02-18 MED ORDER — PIPERACILLIN-TAZOBACTAM IN DEX 2-0.25 GM/50ML IV SOLN
2.2500 g | Freq: Two times a day (BID) | INTRAVENOUS | Status: DC
  Filled 2023-02-18: qty 50

## 2023-02-18 MED ORDER — PIPERACILLIN-TAZOBACTAM IN DEX 2-0.25 GM/50ML IV SOLN
2.2500 g | Freq: Three times a day (TID) | INTRAVENOUS | Status: DC
  Administered 2023-02-18 – 2023-02-19 (×3): 2.25 g via INTRAVENOUS
  Filled 2023-02-18 (×4): qty 50

## 2023-02-18 NOTE — Progress Notes (Addendum)
PROGRESS NOTE        PATIENT DETAILS Name: Gina Terry Age: 89 y.o. Sex: female Date of Birth: March 03, 1921 Admit Date: 02/17/2023 Admitting Physician Clydie Braun, MD [id number], Elvis Coil, MD  Brief Summary: Patient is a 89 y.o.  female with history of PAF-not on anticoagulation, chronic hypoxic respiratory failure on 2 L of oxygen, hypothyroidism, GERD, dementia-who presented with chest pain-upon further evaluation in the ED-she was found to have hypothermia/hypotension-she was thought to have septic shock likely secondary to UTI/aspiration pneumonia-and subsequently admitted to the hospitalist service.    Significant events: 1/18>> admit to [facility]  Significant studies: 1/18>> CT angio chest: No PE-scattered infiltrates bilaterally.  Significant microbiology data: 1/18>> blood culture: Negative 1/18>> urine culture: Pending 1/19>> COVID PCR: Negative 1/19>> respiratory virus panel: Negative  Procedures: None  Consults: Cardiology  Subjective: Lying comfortably in bed-denies any chest pain or shortness of breath.  Feels better-inquiring about discharge.  Objective: Vitals: Blood pressure 135/72, pulse 87, temperature 97.9 F (36.6 C), temperature source Oral, resp. rate 16, height 5\' 4"  (1.626 m), weight 31.8 kg, SpO2 93%.   Exam: Gen Exam:Alert awake-not in any distress-frail appearing. HEENT:atraumatic, normocephalic Chest: B/L clear to auscultation anteriorly CVS:S1S2 regular Abdomen:soft non tender, non distended Extremities:no edema Neurology: Non focal Skin: no rash  Pertinent Labs/Radiology:    Latest Ref Rng & Units 02/18/2023    2:02 AM 02/17/2023    7:46 AM 02/27/2022    3:30 AM  CBC  WBC 4.0 - 10.5 K/uL 23.1  21.4  13.0   Hemoglobin 12.0 - 15.0 g/dL 84.1  32.4  40.1   Hematocrit 36.0 - 46.0 % 32.1  33.1  31.0   Platelets 150 - 400 K/uL 222  PLATELET CLUMPS NOTED ON SMEAR, UNABLE TO ESTIMATE  181     Lab Results   Component Value Date   NA 133 (L) 02/18/2023   K 3.9 02/18/2023   CL 103 02/18/2023   CO2 22 02/18/2023      Assessment/Plan: Septic shock secondary to presumed aspiration pneumonia +/-UTI Sepsis physiology seems to have markedly improved-BP now stable-afebrile-but still significant leukocytosis. Culture data as above Continue Zosyn Reassess on 1/20-narrow antibiotics based on cultures/clinical response.  Acute on chronic hypoxic respiratory failure secondary to pneumonia (on home O2-2 L) Slowly improving Down to 5 L (required NRB 1/18) Continue antibiotics-mobilize-and attempt to titrate down FiO2  Paroxysmal A-fib with RVR Back in sinus rhythm Per prior notes-not a candidate for anticoagulation-given advanced age-prior history of GI bleeding.  Elevated troponin-secondary to demand ischemia/type II non-STEMI Medical management-not a candidate for aggressive care.  Normocytic anemia Mild Likely due to acute illness Follow CBC  AKI Likely hemodynamically mediated Improving with supportive care Avoid nephrotoxic agents  Transaminitis Likely shock liver Follow LFTs  HTN BP stable-amlodipine/metoprolol on hold.  Hypothyroidism Synthroid  GERD PPI  Dementia Pleasantly confused Delirium precautions.  Debility/deconditioning PT/OT eval.  Palliative care DNR in place Follows with hospice in the outpatient setting No role for any further escalation in care  Underweight: Estimated body mass index is 12.02 kg/m as calculated from the following:   Height as of this encounter: 5\' 4"  (1.626 m).   Weight as of this encounter: 31.8 kg.   Code status:   Code Status: Limited: Do not attempt resuscitation (DNR) -DNR-LIMITED -Do Not Intubate/DNI    DVT Prophylaxis: heparin  injection 5,000 Units Start: 02/17/23 2200   Family Communication: Daughter [phone] -updated 1/19   Disposition Plan: Status is: Inpatient Remains inpatient appropriate  because: Severity of illness   Planned Discharge Destination:Home   Diet: Diet Order             Diet Heart Room service appropriate? Yes; Fluid consistency: Thin  Diet effective now                     Antimicrobial agents: Anti-infectives (From admission, onward)    Start     Dose/Rate Route Frequency Ordered Stop   02/17/23 1000  piperacillin-tazobactam (ZOSYN) IVPB 3.375 g        3.375 g 12.5 mL/hr over 240 Minutes Intravenous Every 12 hours 02/17/23 0815     02/17/23 0745  vancomycin (VANCOCIN) IVPB 1000 mg/200 mL premix        1,000 mg 200 mL/hr over 60 Minutes Intravenous  Once 02/17/23 0732 02/17/23 0911        MEDICATIONS: Scheduled Meds:  heparin  5,000 Units Subcutaneous Q8H   levothyroxine  25 mcg Oral Q0600   pantoprazole  40 mg Oral Daily   sodium chloride flush  3 mL Intravenous Q12H   zolpidem  2.5 mg Oral QHS   Continuous Infusions:  sodium chloride Stopped (02/17/23 2300)   piperacillin-tazobactam (ZOSYN)  IV 3.375 g (02/18/23 0824)   PRN Meds:.acetaminophen **OR** acetaminophen, albuterol, senna, trimethobenzamide   I have personally reviewed following labs and imaging studies  LABORATORY DATA: CBC: Recent Labs  Lab 02/17/23 0746 02/18/23 0202  WBC 21.4* 23.1*  NEUTROABS 11.5*  --   HGB 10.5* 10.2*  HCT 33.1* 32.1*  MCV 87.6 87.5  PLT PLATELET CLUMPS NOTED ON SMEAR, UNABLE TO ESTIMATE 222    Basic Metabolic Panel: Recent Labs  Lab 02/17/23 0635 02/18/23 0202  NA 134* 133*  K 3.8 3.9  CL 102 103  CO2 19* 22  GLUCOSE 191* 100*  BUN 19 21  CREATININE 1.30* 1.15*  CALCIUM 8.8* 8.3*  MG 1.9  --     GFR: Estimated Creatinine Clearance: 13.4 mL/min (A) (by C-G formula based on SCr of 1.15 mg/dL (H)).  Liver Function Tests: Recent Labs  Lab 02/17/23 0635 02/18/23 0202  AST 46* 111*  ALT 22 48*  ALKPHOS 46 50  BILITOT 0.7 0.8  PROT 6.0* 5.9*  ALBUMIN 3.1* 3.0*   Recent Labs  Lab 02/17/23 0635  LIPASE 45    No results for input(s): "AMMONIA" in the last 168 hours.  Coagulation Profile: No results for input(s): "INR", "PROTIME" in the last 168 hours.  Cardiac Enzymes: No results for input(s): "CKTOTAL", "CKMB", "CKMBINDEX", "TROPONINI" in the last 168 hours.  BNP (last 3 results) No results for input(s): "PROBNP" in the last 8760 hours.  Lipid Profile: No results for input(s): "CHOL", "HDL", "LDLCALC", "TRIG", "CHOLHDL", "LDLDIRECT" in the last 72 hours.  Thyroid Function Tests: Recent Labs    02/17/23 0746 02/17/23 0920  TSH 99.116*  --   FREET4  --  1.06    Anemia Panel: No results for input(s): "VITAMINB12", "FOLATE", "FERRITIN", "TIBC", "IRON", "RETICCTPCT" in the last 72 hours.  Urine analysis:    Component Value Date/Time   COLORURINE YELLOW 02/17/2023 0733   APPEARANCEUR CLOUDY (A) 02/17/2023 0733   LABSPEC 1.025 02/17/2023 0733   PHURINE 6.5 02/17/2023 0733   GLUCOSEU NEGATIVE 02/17/2023 0733   HGBUR LARGE (A) 02/17/2023 0733   BILIRUBINUR NEGATIVE 02/17/2023 0733   BILIRUBINUR  Large 09/07/2020 0930   KETONESUR NEGATIVE 02/17/2023 0733   PROTEINUR >300 (A) 02/17/2023 0733   UROBILINOGEN negative (A) 09/07/2020 0930   NITRITE NEGATIVE 02/17/2023 0733   LEUKOCYTESUR MODERATE (A) 02/17/2023 0733    Sepsis Labs: Lactic Acid, Venous    Component Value Date/Time   LATICACIDVEN 2.9 (HH) 02/18/2023 0202    MICROBIOLOGY: Recent Results (from the past 240 hours)  Blood culture (routine x 2)     Status: None (Preliminary result)   Collection Time: 02/17/23  7:07 AM   Specimen: BLOOD LEFT FOREARM  Result Value Ref Range Status   Specimen Description BLOOD LEFT FOREARM  Final   Special Requests   Final    BOTTLES DRAWN AEROBIC AND ANAEROBIC Blood Culture results may not be optimal due to an inadequate volume of blood received in culture bottles   Culture   Final    NO GROWTH 1 DAY Performed at [facility], [address], [place], [place] [address]     Report Status PENDING  Incomplete  Blood culture (routine x 2)     Status: None (Preliminary result)   Collection Time: 02/17/23  7:12 AM   Specimen: BLOOD RIGHT FOREARM  Result Value Ref Range Status   Specimen Description BLOOD RIGHT FOREARM  Final   Special Requests   Final    BOTTLES DRAWN AEROBIC AND ANAEROBIC Blood Culture results may not be optimal due to an inadequate volume of blood received in culture bottles   Culture   Final    NO GROWTH 1 DAY Performed at [facility], [address], [place], [place] [address]    Report Status PENDING  Incomplete  MRSA Next Gen by PCR, Nasal     Status: None   Collection Time: 02/18/23  3:05 AM   Specimen: Nasal Mucosa; Nasal Swab  Result Value Ref Range Status   MRSA by PCR Next Gen NOT DETECTED NOT DETECTED Final    Comment: (NOTE) The GeneXpert MRSA Assay (FDA approved for NASAL specimens only), is one component of a comprehensive MRSA colonization surveillance program. It is not intended to diagnose MRSA infection nor to guide or monitor treatment for MRSA infections. Test performance is not FDA approved in patients less than 82 years old. Performed at [facility], [address], [place], [place] [address]   Respiratory (~20 pathogens) panel by PCR     Status: None   Collection Time: 02/18/23  7:07 AM   Specimen: Nasopharyngeal Swab; Respiratory  Result Value Ref Range Status   Adenovirus NOT DETECTED NOT DETECTED Final   Coronavirus 229E NOT DETECTED NOT DETECTED Final    Comment: (NOTE) The Coronavirus on the Respiratory Panel, DOES NOT test for the novel  Coronavirus (2019 nCoV)    Coronavirus HKU1 NOT DETECTED NOT DETECTED Final   Coronavirus NL63 NOT DETECTED NOT DETECTED Final   Coronavirus OC43 NOT DETECTED NOT DETECTED Final   Metapneumovirus NOT DETECTED NOT DETECTED Final   Rhinovirus / Enterovirus NOT DETECTED NOT DETECTED Final   Influenza A NOT DETECTED NOT DETECTED Final   Influenza B NOT  DETECTED NOT DETECTED Final   Parainfluenza Virus 1 NOT DETECTED NOT DETECTED Final   Parainfluenza Virus 2 NOT DETECTED NOT DETECTED Final   Parainfluenza Virus 3 NOT DETECTED NOT DETECTED Final   Parainfluenza Virus 4 NOT DETECTED NOT DETECTED Final   Respiratory Syncytial Virus NOT DETECTED NOT DETECTED Final   Bordetella pertussis NOT DETECTED NOT DETECTED Final  Bordetella Parapertussis NOT DETECTED NOT DETECTED Final   Chlamydophila pneumoniae NOT DETECTED NOT DETECTED Final   Mycoplasma pneumoniae NOT DETECTED NOT DETECTED Final    Comment: Performed at [facility], [address], [place], [place] [address]  SARS Coronavirus 2 by RT PCR (hospital order, performed in [facility] hospital lab) *cepheid single result test* Anterior Nasal Swab     Status: None   Collection Time: 02/18/23  7:07 AM   Specimen: Anterior Nasal Swab  Result Value Ref Range Status   SARS Coronavirus 2 by RT PCR NEGATIVE NEGATIVE Final    Comment: Performed at [facility], [address], [place], [place] [address]    RADIOLOGY STUDIES/RESULTS: CT Angio Chest PE W and/or Wo Contrast Result Date: 02/17/2023 CLINICAL DATA:  Initial and AFib with RVR per EMS.  Chest pain. EXAM: CT ANGIOGRAPHY CHEST WITH CONTRAST TECHNIQUE: Multidetector CT imaging of the chest was performed using the standard protocol during bolus administration of intravenous contrast. Multiplanar CT image reconstructions and MIPs were obtained to evaluate the vascular anatomy. RADIATION DOSE REDUCTION: This exam was performed according to the departmental dose-optimization program which includes automated exposure control, adjustment of the mA and/or kV according to patient size and/or use of iterative reconstruction technique. CONTRAST:  50mL OMNIPAQUE IOHEXOL 350 MG/ML SOLN COMPARISON:  Radiograph 02/26/2022 FINDINGS: Cardiovascular: Negative for acute pulmonary embolism. No cardiomegaly. Reflux of contrast into the IVC and hepatic  veins compatible with elevated right heart pressures. Coronary artery and aortic atherosclerotic calcification. No pericardial effusion. Mediastinum/Nodes: Trachea and esophagus are unremarkable. No definite adenopathy. Lungs/Pleura: Emphysema. Small bilateral pleural effusions. Airspace opacities in the dependent upper and lower lobes bilaterally. Ground-glass opacities in the left upper lobe. Bronchiolectasis in the lower lobes with scattered mucous plugs. No pneumothorax. Upper Abdomen: No acute abnormality. Musculoskeletal: Severe compression fractures of T8 and T10 are presumed chronic. Review of the MIP images confirms the above findings. IMPRESSION: 1. Negative for acute pulmonary embolism. 2. Small bilateral pleural effusions. 3. Airspace opacities in the dependent upper and lower lobes bilaterally. Likely at least in part due to atelectasis however pneumonia and/or aspiration is difficult to exclude. 4. Reflux of contrast into the IVC and hepatic veins compatible with elevated right heart pressures. 5. Severe compression fractures of T8 and T10 are presumed chronic however age indeterminate without comparison. Aortic Atherosclerosis (ICD10-I70.0) and Emphysema (ICD10-J43.9). Electronically Signed   By: Minerva Fester M.D.   On: 02/17/2023 12:27     LOS: 1 day   Jeoffrey Massed, MD  [facility]    To contact the attending provider between 7A-7P or the covering provider during after hours 7P-7A, please log into the web site [email or web address] and access using universal [place] password for that web site. If you do not have the password, please call the hospital operator.  02/18/2023, 10:20 AM

## 2023-02-18 NOTE — Progress Notes (Signed)
[facility] Patient Visit   Ms. 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 She was admitted 02/16/22 with diagnosis of Sepsis. [facility] was notified of arrival to the hospital by the hospital. Per Dr. Patric Dykes, hospice physician, this is a related hospital admission.    Visited patient with daughter at bedside. Patient is eating breakfast without complaint or signs of distress. Discussed plan of care with daughter and exchanged report with bedside nurse.    Patient is GIP appropriate for evaluation and management of infection, including IV  antibiotics.     Vital Signs: 97.9/72/18   128/86    O2 91% on 5LPM via    I&O: not documented   Abnormal labs: NA 133, Cre 1.15, CA 8.3, Alb 3.0, AST 111, ALT 48, GFRe 43, Lactic Acid 2.9, WBC 23.1, HGB 10.2  Diagnostics: none new    IV/PRN Meds: Zosyn 3.357 IV every 12 hours     Assessment and Plan: from 1.19 Septic shock secondary to presumed aspiration pneumonia +/-UTI Sepsis physiology seems to have markedly improved-BP now stable-afebrile-but still significant leukocytosis. Culture data as above Continue Zosyn Reassess on 1/20-narrow antibiotics based on cultures/clinical response.   Acute on chronic hypoxic respiratory failure secondary to pneumonia (on home O2-2 L) Slowly improving Down to 5 L (required NRB 1/18) Continue antibiotics-mobilize-and attempt to titrate down FiO2   Paroxysmal A-fib with RVR Back in sinus rhythm Per prior notes-not a candidate for anticoagulation-given advanced age-prior history of GI bleeding.   Elevated troponin-secondary to demand ischemia/type II non-STEMI Medical management-not a candidate for aggressive care.      Dementia Pleasantly confused Delirium precautions.   Discharge Planning: Ongoing   Family Contact: with daughter at bedside   IDT: Updated   Goals of Care: DNR   Glenna Fellows BSN,  RN, [facility] hospital liaison [phone]

## 2023-02-18 NOTE — Evaluation (Signed)
Clinical/Bedside Swallow Evaluation Patient Details  Name: Gina Terry MRN: [id number] Date of Birth: [birth year]  Today's Date: 02/18/2023 Time: SLP Start Time (ACUTE ONLY): 1432 SLP Stop Time (ACUTE ONLY): 1451 SLP Time Calculation (min) (ACUTE ONLY): 19 min  Past Medical History:  Past Medical History:  Diagnosis Date   Aortic atherosclerosis (HCC)    Arthritis    Chronic bilateral low back pain    Colitis    Dementia (HCC)    GERD (gastroesophageal reflux disease)    History of bone density study    History of mammogram    Hypertension, essential 04/19/2016   Hypothyroid    Left bundle branch block    Osteoporosis    Paroxysmal atrial fibrillation [facility])    Past Surgical History:  Past Surgical History:  Procedure Laterality Date   APPENDECTOMY     CARDIOVASCULAR STRESS TEST  03/24/2009   EF 78%   CHOLECYSTECTOMY     COLONOSCOPY     TONSILLECTOMY AND ADENOIDECTOMY     US ECHOCARDIOGRAPHY  05/07/2008   Est EF 50-55%   HPI:  Pt is a 89 y.o. female who presented with AMS and c/o chest pain. Pt found to have hypothermia/hypotension and was thought to have septic shock likely secondary to UTI/aspiration pneumonia. CT chest 1/18: negative for PE, airspace opacities in the dependent upper and lower lobes bilaterally. PMH: paroxysmal atrial fibrillation, left bundle branch block, chronic respiratory failure on 2 L oxygen, hypothyroidism, dementia, osteoporosis, GI bleed, and GERD.    Assessment / Plan / Recommendation  Clinical Impression  Pt was seen for bedside swallow evaluation with her daughter and son-in-law present. Pt and her family denied the pt having a history of oropharyngeal dysphagia. Oral mechanism exam was [facility] and she presented with adequate, natural dentition. She tolerated all solids and liquids without signs or symptoms of aspiration or any other symptoms of oropharyngeal dysphagia. Pt's performance and the rationale for the referral were discussed. Pt is a  current hospice pt and pt's daughter advised that they would like to avoid more aggressive evaluations (e.g., MBS); referring MD in agreement. A regular texture diet with thin liquids is recommended at this time and further acute skilled SLP services are not clinically indicated for swallowing at this time. SLP Visit Diagnosis: Dysphagia, unspecified (R13.10)    Aspiration Risk  No limitations    Diet Recommendation Regular;Thin liquid    Liquid Administration via: Cup;Straw Medication Administration: Whole meds with liquid (or whole with puree; as tolerated) Supervision: Patient able to self feed Postural Changes: Seated upright at 90 degrees    Other  Recommendations Oral Care Recommendations: Oral care BID    Recommendations for follow up therapy are one component of a multi-disciplinary discharge planning process, led by the attending physician.  Recommendations may be updated based on patient status, additional functional criteria and insurance authorization.  Follow up Recommendations No SLP follow up      Assistance Recommended at Discharge    Functional Status Assessment Patient has not had a recent decline in their functional status  Frequency and Duration            Prognosis        Swallow Study   General Date of Onset: 02/17/23 HPI: Pt is a 89 y.o. female who presented with AMS and c/o chest pain. Pt found to have hypothermia/hypotension and was thought to have septic shock likely secondary to UTI/aspiration pneumonia. CT chest 1/18: negative for PE, airspace opacities in the dependent  upper and lower lobes bilaterally. PMH: paroxysmal atrial fibrillation, left bundle branch block, chronic respiratory failure on 2 L oxygen, hypothyroidism, dementia, osteoporosis, GI bleed, and GERD. Type of Study: Bedside Swallow Evaluation Previous Swallow Assessment: none Diet Prior to this Study: Regular Temperature Spikes Noted: No Respiratory Status: Nasal cannula History of  Recent Intubation: No Behavior/Cognition: Alert;Cooperative;Pleasant mood Oral Cavity Assessment: Within Functional Limits Oral Care Completed by SLP: No Oral Cavity - Dentition: Adequate natural dentition Vision: Functional for self-feeding Self-Feeding Abilities: Needs assist Patient Positioning: Upright in bed;Postural control adequate for testing Baseline Vocal Quality: Normal Volitional Swallow: Able to elicit    Oral/Motor/Sensory Function Overall Oral Motor/Sensory Function: Within functional limits   Ice Chips Ice chips: Within functional limits Presentation: Spoon   Thin Liquid Thin Liquid: Within functional limits Presentation: Cup;Straw    Nectar Thick Nectar Thick Liquid: Not tested   Honey Thick Honey Thick Liquid: Not tested   Puree Puree: Within functional limits Presentation: Spoon   Solid     Solid: Within functional limits Presentation: Self Fed     Gina Jersey I. [employer], Gina Terry, Gina Terry Acute Rehabilitation Services Office number [phone]  Scheryl Marten 02/18/2023,3:28 PM

## 2023-02-18 NOTE — Plan of Care (Signed)

## 2023-02-19 ENCOUNTER — Other Ambulatory Visit (HOSPITAL_COMMUNITY): Payer: Self-pay

## 2023-02-19 ENCOUNTER — Telehealth: Payer: Self-pay

## 2023-02-19 DIAGNOSIS — R7989 Other specified abnormal findings of blood chemistry: Secondary | ICD-10-CM | POA: Diagnosis not present

## 2023-02-19 DIAGNOSIS — A419 Sepsis, unspecified organism: Secondary | ICD-10-CM | POA: Diagnosis not present

## 2023-02-19 DIAGNOSIS — J189 Pneumonia, unspecified organism: Secondary | ICD-10-CM | POA: Diagnosis not present

## 2023-02-19 DIAGNOSIS — J9621 Acute and chronic respiratory failure with hypoxia: Secondary | ICD-10-CM | POA: Diagnosis not present

## 2023-02-19 LAB — COMPREHENSIVE METABOLIC PANEL
ALT: 31 U/L (ref 0–44)
AST: 51 U/L — ABNORMAL HIGH (ref 15–41)
Albumin: 2.6 g/dL — ABNORMAL LOW (ref 3.5–5.0)
Alkaline Phosphatase: 38 U/L (ref 38–126)
Anion gap: 11 (ref 5–15)
BUN: 18 mg/dL (ref 8–23)
CO2: 22 mmol/L (ref 22–32)
Calcium: 8.3 mg/dL — ABNORMAL LOW (ref 8.9–10.3)
Chloride: 102 mmol/L (ref 98–111)
Creatinine, Ser: 0.89 mg/dL (ref 0.44–1.00)
GFR, Estimated: 58 mL/min — ABNORMAL LOW (ref >60)
Glucose, Bld: 96 mg/dL (ref 70–99)
Potassium: 3.6 mmol/L (ref 3.5–5.1)
Sodium: 135 mmol/L (ref 135–145)
Total Bilirubin: 0.4 mg/dL (ref 0.0–1.2)
Total Protein: 5.3 g/dL — ABNORMAL LOW (ref 6.5–8.1)

## 2023-02-19 LAB — CBC
HCT: 30.1 % — ABNORMAL LOW (ref 36.0–46.0)
Hemoglobin: 9.8 g/dL — ABNORMAL LOW (ref 12.0–15.0)
MCH: 27.4 pg (ref 26.0–34.0)
MCHC: 32.6 g/dL (ref 30.0–36.0)
MCV: 84.1 fL (ref 80.0–100.0)
Platelets: 204 10*3/uL (ref 150–400)
RBC: 3.58 MIL/uL — ABNORMAL LOW (ref 3.87–5.11)
RDW: 17.1 % — ABNORMAL HIGH (ref 11.5–15.5)
WBC: 18.6 10*3/uL — ABNORMAL HIGH (ref 4.0–10.5)
nRBC: 0 % (ref 0.0–0.2)

## 2023-02-19 LAB — URINE CULTURE: Culture: >=100000 — AB

## 2023-02-19 MED ORDER — AMOXICILLIN-POT CLAVULANATE 875-125 MG PO TABS: 1.0000 | ORAL_TABLET | Freq: Two times a day (BID) | ORAL | Status: DC

## 2023-02-19 MED ORDER — AMOXICILLIN-POT CLAVULANATE 500-125 MG PO TABS
1.0000 | ORAL_TABLET | Freq: Two times a day (BID) | ORAL | Status: DC
  Administered 2023-02-19: 1 via ORAL
  Filled 2023-02-19 (×2): qty 1

## 2023-02-19 MED ORDER — AMOXICILLIN-POT CLAVULANATE 875-125 MG PO TABS
1.0000 | ORAL_TABLET | Freq: Two times a day (BID) | ORAL | 0 refills | Status: DC
  Filled 2023-02-19: qty 8, 4d supply, fill #0

## 2023-02-19 MED ORDER — AMOXICILLIN-POT CLAVULANATE 500-125 MG PO TABS
1.0000 | ORAL_TABLET | Freq: Two times a day (BID) | ORAL | 0 refills | Status: AC
  Filled 2023-02-19: qty 8, 4d supply, fill #0

## 2023-02-19 MED ORDER — FOSFOMYCIN TROMETHAMINE 3 G PO PACK
3.0000 g | PACK | Freq: Once | ORAL | Status: AC
  Administered 2023-02-19: 3 g via ORAL
  Filled 2023-02-19: qty 3

## 2023-02-19 NOTE — Progress Notes (Signed)
OT Cancellation Note  Patient Details Name: Gina Terry MRN: [id number] DOB: [birth year]   Cancelled Treatment:    Reason Eval/Treat Not Completed: Other (comment) (pt from hospice, plans to return home with hospice, OT evaluation not approrpriate. Please reconsult if there is  a change in pt status)  Carver Fila, OTD, OTR/L [facility] [phone]   Dalphine Handing 02/19/2023, 9:55 AM

## 2023-02-19 NOTE — Progress Notes (Signed)
Victorian Schinkel Churchman to be D/C'd home with 24 hour caregiver per MD order. Discussed with the patient, daughter, and caregiver and all questions fully answered. TOC medications delivered to patient. Skin clean, dry and intact without evidence of skin break down, no evidence of skin tears noted. Scattered bruising present. IV catheters discontinued intact. Site without signs and symptoms of complications. Dressing and pressure applied.  An After Visit Summary was printed and given to the patient and PTAR.   Jon Gills  02/19/2023

## 2023-02-19 NOTE — Progress Notes (Signed)
PT Cancellation Note  Patient Details Name: AARUSHI LOPES MRN: [id number] DOB: 02-05-1921   Cancelled Treatment:    Reason Eval/Treat Not Completed: PT screened, no needs identified, will sign off (Pt active with hospice and plans to return home with hospice. No acute PT needs identified.)   Gladys Damme 02/19/2023, 10:12 AM

## 2023-02-19 NOTE — TOC Transition Note (Signed)
Transition of Care [facility]) - Discharge Note   Patient Details  Name: Gina Terry MRN: [id number] Date of Birth: 03-07-1921  Transition of Care [facility]) CM/SW Contact:  Gordy Clement, RN Phone Number: 02/19/2023, 9:42 AM   Clinical Narrative:    Patient will DC to home today with Hospice care. PTAR will transport. Patient requires 2L continuous O2. [facility] will resume services   No additional TOC needs           Patient Goals and CMS Choice            Discharge Placement                       Discharge Plan and Services Additional resources added to the After Visit Summary for                                       Social Drivers of Health (SDOH) Interventions SDOH Screenings   Food Insecurity: No Food Insecurity (02/18/2023)  Housing: Low Risk  (02/18/2023)  Transportation Needs: No Transportation Needs (02/18/2023)  Utilities: Not At Risk (02/18/2023)  Depression (PHQ2-9): Low Risk  (08/24/2020)  Social Connections: Unknown (02/18/2023)  Tobacco Use: Medium Risk (02/17/2023)     Readmission Risk Interventions     No data to display

## 2023-02-19 NOTE — Plan of Care (Signed)
  Problem: Education: Goal: Knowledge of General Education information will improve Description: Including pain rating scale, medication(s)/side effects and non-pharmacologic comfort measures Outcome: Progressing   Problem: Health Behavior/Discharge Planning: Goal: Ability to manage health-related needs will improve Outcome: Progressing   Problem: Clinical Measurements: Goal: Ability to maintain clinical measurements within normal limits will improve Outcome: Progressing   Problem: Activity: Goal: Risk for activity intolerance will decrease Outcome: Progressing   Problem: Nutrition: Goal: Adequate nutrition will be maintained Outcome: Progressing   Problem: Coping: Goal: Level of anxiety will decrease Outcome: Progressing   Problem: Safety: Goal: Ability to remain free from injury will improve Outcome: Progressing   

## 2023-02-19 NOTE — Discharge Summary (Addendum)
PATIENT DETAILS Name: Gina Terry Age: 89 y.o. Sex: female Date of Birth: Feb 15, 1921 MRN: [id number]. Admitting Physician: Clydie Braun, MD [id number], Elvis Coil, MD  Admit Date: 02/17/2023 Discharge date: 02/19/2023  Recommendations for Outpatient Follow-up:  Follow up with PCP in 1-2 weeks  Admitted From:  Home w hospice  Disposition: Home w hospice   Discharge Condition: fair  CODE STATUS:   Code Status: Limited: Do not attempt resuscitation (DNR) -DNR-LIMITED -Do Not Intubate/DNI    Diet recommendation:  Diet Order             Diet - low sodium heart healthy           Diet Heart Room service appropriate? Yes; Fluid consistency: Thin  Diet effective now                    Brief Summary: Patient is a 89 y.o.  female with history of PAF-not on anticoagulation, chronic hypoxic respiratory failure on 2 L of oxygen, hypothyroidism, GERD, dementia-who presented with chest pain-upon further evaluation in the ED-she was found to have hypothermia/hypotension-she was thought to have septic shock likely secondary to UTI/aspiration pneumonia-and subsequently admitted to the hospitalist service.     Significant events: 1/18>> admit to [facility]   Significant studies: 1/18>> CT angio chest: No PE-scattered infiltrates bilaterally.   Significant microbiology data: 1/18>> blood culture: Negative 1/18>> urine culture: Klebsiella 1/19>> COVID PCR: Negative 1/19>> respiratory virus panel: Negative   Procedures: None   Consults: Cardiology  Brief Hospital Course: Septic shock secondary to presumed aspiration pneumonia +/-UTI Sepsis physiology has resolved-she looks remarkably well clinically-afebrile-on chest 2 L of oxygen-sitting up in reading a book when I walked in this morning.   Leukocytosis is now downtrending-she is very anxious to go home-she is followed at home with hospice and has 24/7 caregivers She was maintained on Zosyn during this  hospitalization-discussed with pharmacy/ID pharmacy-plan is to transition to Augmentin on discharge-will get 1 dose of fosfomycin to cover UTI. Long discussion with daughter over the phone on day of discharge   Acute on chronic hypoxic respiratory failure secondary to pneumonia (on home O2-2 L) Improved-initially required NRB on 1/18-down to 2 L of oxygen which is her usual regimen.   Paroxysmal A-fib with RVR Back in sinus rhythm Per prior notes-not a candidate for anticoagulation-given advanced age-prior history of GI bleeding.   Elevated troponin-secondary to demand ischemia/type II non-STEMI Medical management-not a candidate for aggressive care.   Normocytic anemia Mild Likely due to acute illness Follow CBC   AKI Likely hemodynamically mediated Improving with supportive care Avoid nephrotoxic agents   Transaminitis Likely shock liver Follow LFTs   HTN BP stable-amlodipine/metoprolol on hold.   Hypothyroidism Synthroid   GERD PPI   Dementia Pleasantly confused Delirium precautions.   Debility/deconditioning   Palliative care DNR in place Follows with hospice in the outpatient setting No role for any further escalation in care   Underweight: Estimated body mass index is 12.02 kg/m as calculated from the following:   Height as of this encounter: 5\' 4"  (1.626 m).   Weight as of this encounter: 31.8 kg.   Discharge Diagnoses:  Principal Problem:   Severe sepsis (HCC) Active Problems:   Acute on chronic respiratory failure with hypoxia (HCC)   CAP (community acquired pneumonia)   Transient hypotension   Elevated troponin   Paroxysmal atrial fibrillation (HCC)   AKI (acute kidney injury) (HCC)   Primary hypothyroidism   Normocytic anemia  Underweight   Gastroesophageal reflux   Discharge Instructions:  Activity:  As tolerated with Full fall precautions use walker/cane & assistance as needed   Discharge Instructions     Call MD for:   extreme fatigue   Complete by: As directed    Call MD for:  persistant dizziness or light-headedness   Complete by: As directed    Diet - low sodium heart healthy   Complete by: As directed    Discharge instructions   Complete by: As directed    Follow with Primary MD  Venita Sheffield, MD in 1-2 weeks  Please get a complete blood count and chemistry panel checked by your Primary MD at your next visit, and again as instructed by your Primary MD.  Get Medicines reviewed and adjusted: Please take all your medications with you for your next visit with your Primary MD  Laboratory/radiological data: Please request your Primary MD to go over all hospital tests and procedure/radiological results at the follow up, please ask your Primary MD to get all Hospital records sent to his/her office.  In some cases, they will be blood work, cultures and biopsy results pending at the time of your discharge. Please request that your primary care M.D. follows up on these results.  Also Note the following: If you experience worsening of your admission symptoms, develop shortness of breath, life threatening emergency, suicidal or homicidal thoughts you must seek medical attention immediately by calling 911 or calling your MD immediately  if symptoms less severe.  You must read complete instructions/literature along with all the possible adverse reactions/side effects for all the Medicines you take and that have been prescribed to you. Take any new Medicines after you have completely understood and accpet all the possible adverse reactions/side effects.   Do not drive when taking Pain medications or sleeping medications (Benzodaizepines)  Do not take more than prescribed Pain, Sleep and Anxiety Medications. It is not advisable to combine anxiety,sleep and pain medications without talking with your primary care practitioner  Special Instructions: If you have smoked or chewed Tobacco  in the last 2 yrs  please stop smoking, stop any regular Alcohol  and or any Recreational drug use.  Wear Seat belts while driving.  Please note: You were cared for by a hospitalist during your hospital stay. Once you are discharged, your primary care physician will handle any further medical issues. Please note that NO REFILLS for any discharge medications will be authorized once you are discharged, as it is imperative that you return to your primary care physician (or establish a relationship with a primary care physician if you do not have one) for your post hospital discharge needs so that they can reassess your need for medications and monitor your lab values.   Increase activity slowly   Complete by: As directed       Allergies as of 02/19/2023       Reactions   Rofecoxib    Other reaction(s): Other (See Comments) reflux, edema    Sulfamethoxazole-trimethoprim Nausea Only   Codeine Other (See Comments)   unknown Other reaction(s): GI Upset (intolerance)   Estradiol Rash   Local rash   Flurbiprofen    Other reaction(s): GI Upset (intolerance)   Ibuprofen    Other reaction(s): GI Upset (intolerance)        Medication List     STOP taking these medications    ciprofloxacin 500 MG tablet Commonly known as: CIPRO       TAKE  these medications    acetaminophen 500 MG tablet Commonly known as: TYLENOL Take 500 mg by mouth every 6 (six) hours as needed for mild pain.   amLODipine 2.5 MG tablet Commonly known as: NORVASC TAKE 1 TABLET BY MOUTH EVERY DAY   amoxicillin-clavulanate 500-125 MG tablet Commonly known as: AUGMENTIN Take 1 tablet by mouth every 12 (twelve) hours for 4 days.   calcium carbonate 500 MG chewable tablet Commonly known as: TUMS - dosed in mg elemental calcium Chew 3 tablets by mouth daily as needed for indigestion or heartburn.   docusate sodium 100 MG capsule Commonly known as: COLACE Take 100 mg by mouth 2 (two) times daily as needed for mild  constipation.   metoprolol succinate 25 MG 24 hr tablet Commonly known as: TOPROL-XL Take 1 tablet (25 mg total) by mouth daily.   nitroGLYCERIN 0.4 MG SL tablet Commonly known as: NITROSTAT Place 1 tablet (0.4 mg total) under the tongue every 5 (five) minutes as needed for chest pain.   pantoprazole 40 MG tablet Commonly known as: Protonix Take 1 tablet (40 mg total) by mouth 2 (two) times daily. Take 1 pill twice a day for a month then continue taking once daily What changed: when to take this   senna 8.6 MG Tabs tablet Commonly known as: SENOKOT Take 1 tablet by mouth daily as needed for mild constipation.   zolpidem 5 MG tablet Commonly known as: AMBIEN TAKE 1 TABLET BY MOUTH EVERYDAY AT BEDTIME What changed: See the new instructions.        Follow-up Information     Venita Sheffield, MD. Schedule an appointment as soon as possible for a visit in 1 week(s).   Specialty: Internal Medicine Contact information: [address] [place] [address] [phone]                Allergies  Allergen Reactions   Rofecoxib     Other reaction(s): Other (See Comments) reflux, edema    Sulfamethoxazole-Trimethoprim Nausea Only   Codeine Other (See Comments)    unknown Other reaction(s): GI Upset (intolerance)   Estradiol Rash    Local rash   Flurbiprofen     Other reaction(s): GI Upset (intolerance)   Ibuprofen     Other reaction(s): GI Upset (intolerance)     Other Procedures/Studies: CT Angio Chest PE W and/or Wo Contrast Result Date: 02/17/2023 CLINICAL DATA:  Initial and AFib with RVR per EMS.  Chest pain. EXAM: CT ANGIOGRAPHY CHEST WITH CONTRAST TECHNIQUE: Multidetector CT imaging of the chest was performed using the standard protocol during bolus administration of intravenous contrast. Multiplanar CT image reconstructions and MIPs were obtained to evaluate the vascular anatomy. RADIATION DOSE REDUCTION: This exam was performed according to the  departmental dose-optimization program which includes automated exposure control, adjustment of the mA and/or kV according to patient size and/or use of iterative reconstruction technique. CONTRAST:  50mL OMNIPAQUE IOHEXOL 350 MG/ML SOLN COMPARISON:  Radiograph 02/26/2022 FINDINGS: Cardiovascular: Negative for acute pulmonary embolism. No cardiomegaly. Reflux of contrast into the IVC and hepatic veins compatible with elevated right heart pressures. Coronary artery and aortic atherosclerotic calcification. No pericardial effusion. Mediastinum/Nodes: Trachea and esophagus are unremarkable. No definite adenopathy. Lungs/Pleura: Emphysema. Small bilateral pleural effusions. Airspace opacities in the dependent upper and lower lobes bilaterally. Ground-glass opacities in the left upper lobe. Bronchiolectasis in the lower lobes with scattered mucous plugs. No pneumothorax. Upper Abdomen: No acute abnormality. Musculoskeletal: Severe compression fractures of T8 and T10 are presumed chronic. Review  of the MIP images confirms the above findings. IMPRESSION: 1. Negative for acute pulmonary embolism. 2. Small bilateral pleural effusions. 3. Airspace opacities in the dependent upper and lower lobes bilaterally. Likely at least in part due to atelectasis however pneumonia and/or aspiration is difficult to exclude. 4. Reflux of contrast into the IVC and hepatic veins compatible with elevated right heart pressures. 5. Severe compression fractures of T8 and T10 are presumed chronic however age indeterminate without comparison. Aortic Atherosclerosis (ICD10-I70.0) and Emphysema (ICD10-J43.9). Electronically Signed   By: Minerva Fester M.D.   On: 02/17/2023 12:27     TODAY-DAY OF DISCHARGE:  Subjective:   Sheyli Gargas today has no headache,no chest abdominal pain,no new weakness tingling or numbness, feels much better wants to go home today.   Objective:   Blood pressure 135/74, pulse 85, temperature (!) 97.4 F (36.3 C),  temperature source Axillary, resp. rate (!) 22, height 5\' 4"  (1.626 m), weight 31.8 kg, SpO2 92%.  Intake/Output Summary (Last 24 hours) at 02/19/2023 1011 Last data filed at 02/19/2023 0954 Gross per 24 hour  Intake 733 ml  Output --  Net 733 ml   Filed Weights   02/17/23 0739  Weight: 31.8 kg    Exam: Awake Alert, Oriented *3, No new F.N deficits, Normal affect [place].AT,PERRAL Supple Neck,No JVD, No cervical lymphadenopathy appriciated.  Symmetrical Chest wall movement, Good air movement bilaterally, CTAB RRR,No Gallops,Rubs or new Murmurs, No Parasternal Heave +ve B.Sounds, Abd Soft, Non tender, No organomegaly appriciated, No rebound -guarding or rigidity. No Cyanosis, Clubbing or edema, No new Rash or bruise   PERTINENT RADIOLOGIC STUDIES: CT Angio Chest PE W and/or Wo Contrast Result Date: 02/17/2023 CLINICAL DATA:  Initial and AFib with RVR per EMS.  Chest pain. EXAM: CT ANGIOGRAPHY CHEST WITH CONTRAST TECHNIQUE: Multidetector CT imaging of the chest was performed using the standard protocol during bolus administration of intravenous contrast. Multiplanar CT image reconstructions and MIPs were obtained to evaluate the vascular anatomy. RADIATION DOSE REDUCTION: This exam was performed according to the departmental dose-optimization program which includes automated exposure control, adjustment of the mA and/or kV according to patient size and/or use of iterative reconstruction technique. CONTRAST:  50mL OMNIPAQUE IOHEXOL 350 MG/ML SOLN COMPARISON:  Radiograph 02/26/2022 FINDINGS: Cardiovascular: Negative for acute pulmonary embolism. No cardiomegaly. Reflux of contrast into the IVC and hepatic veins compatible with elevated right heart pressures. Coronary artery and aortic atherosclerotic calcification. No pericardial effusion. Mediastinum/Nodes: Trachea and esophagus are unremarkable. No definite adenopathy. Lungs/Pleura: Emphysema. Small bilateral pleural effusions. Airspace opacities in  the dependent upper and lower lobes bilaterally. Ground-glass opacities in the left upper lobe. Bronchiolectasis in the lower lobes with scattered mucous plugs. No pneumothorax. Upper Abdomen: No acute abnormality. Musculoskeletal: Severe compression fractures of T8 and T10 are presumed chronic. Review of the MIP images confirms the above findings. IMPRESSION: 1. Negative for acute pulmonary embolism. 2. Small bilateral pleural effusions. 3. Airspace opacities in the dependent upper and lower lobes bilaterally. Likely at least in part due to atelectasis however pneumonia and/or aspiration is difficult to exclude. 4. Reflux of contrast into the IVC and hepatic veins compatible with elevated right heart pressures. 5. Severe compression fractures of T8 and T10 are presumed chronic however age indeterminate without comparison. Aortic Atherosclerosis (ICD10-I70.0) and Emphysema (ICD10-J43.9). Electronically Signed   By: Minerva Fester M.D.   On: 02/17/2023 12:27     PERTINENT LAB RESULTS: CBC: Recent Labs    02/18/23 0202 02/19/23 0416  WBC 23.1* 18.6*  HGB 10.2* 9.8*  HCT 32.1* 30.1*  PLT 222 204   CMET CMP     Component Value Date/Time   NA 135 02/19/2023 0416   NA 139 04/06/2017 1420   K 3.6 02/19/2023 0416   CL 102 02/19/2023 0416   CO2 22 02/19/2023 0416   GLUCOSE 96 02/19/2023 0416   BUN 18 02/19/2023 0416   BUN 20 04/06/2017 1420   CREATININE 0.89 02/19/2023 0416   CREATININE 1.03 (H) 12/14/2021 1425   CALCIUM 8.3 (L) 02/19/2023 0416   PROT 5.3 (L) 02/19/2023 0416   PROT 6.7 04/06/2017 1420   ALBUMIN 2.6 (L) 02/19/2023 0416   ALBUMIN 4.5 04/06/2017 1420   AST 51 (H) 02/19/2023 0416   ALT 31 02/19/2023 0416   ALKPHOS 38 02/19/2023 0416   BILITOT 0.4 02/19/2023 0416   BILITOT 0.2 04/06/2017 1420   EGFR 49 (L) 12/14/2021 1425   GFRNONAA 58 (L) 02/19/2023 0416    GFR Estimated Creatinine Clearance: 17.3 mL/min (by C-G formula based on SCr of 0.89 mg/dL). Recent Labs     02/17/23 0635  LIPASE 45   No results for input(s): "CKTOTAL", "CKMB", "CKMBINDEX", "TROPONINI" in the last 72 hours. Invalid input(s): "POCBNP" No results for input(s): "DDIMER" in the last 72 hours. No results for input(s): "HGBA1C" in the last 72 hours. No results for input(s): "CHOL", "HDL", "LDLCALC", "TRIG", "CHOLHDL", "LDLDIRECT" in the last 72 hours. Recent Labs    02/17/23 0746  TSH 99.116*   No results for input(s): "VITAMINB12", "FOLATE", "FERRITIN", "TIBC", "IRON", "RETICCTPCT" in the last 72 hours. Coags: No results for input(s): "INR" in the last 72 hours.  Invalid input(s): "PT" Microbiology: Recent Results (from the past 240 hours)  Blood culture (routine x 2)     Status: None (Preliminary result)   Collection Time: 02/17/23  7:07 AM   Specimen: BLOOD LEFT FOREARM  Result Value Ref Range Status   Specimen Description BLOOD LEFT FOREARM  Final   Special Requests   Final    BOTTLES DRAWN AEROBIC AND ANAEROBIC Blood Culture results may not be optimal due to an inadequate volume of blood received in culture bottles   Culture   Final    NO GROWTH 1 DAY Performed at [facility], [address] [place], [place] [address]    Report Status PENDING  Incomplete  Blood culture (routine x 2)     Status: None (Preliminary result)   Collection Time: 02/17/23  7:12 AM   Specimen: BLOOD RIGHT FOREARM  Result Value Ref Range Status   Specimen Description BLOOD RIGHT FOREARM  Final   Special Requests   Final    BOTTLES DRAWN AEROBIC AND ANAEROBIC Blood Culture results may not be optimal due to an inadequate volume of blood received in culture bottles   Culture   Final    NO GROWTH 1 DAY Performed at [facility], [address] [place], [place] [address]    Report Status PENDING  Incomplete  Urine Culture     Status: Abnormal   Collection Time: 02/17/23 10:35 AM   Specimen: Urine, Clean Catch  Result Value Ref Range Status   Specimen Description URINE,  CLEAN CATCH  Final   Special Requests   Final    NONE Performed at [facility], [address] [place], [place] [address]    Culture >=100,000 COLONIES/mL ESCHERICHIA COLI (A)  Final   Report Status 02/19/2023 FINAL  Final   Organism ID, Bacteria ESCHERICHIA  COLI (A)  Final      Susceptibility   Escherichia coli - MIC*    AMPICILLIN >=32 RESISTANT Resistant     CEFAZOLIN 16 SENSITIVE Sensitive     CEFEPIME <=0.12 SENSITIVE Sensitive     CEFTRIAXONE <=0.25 SENSITIVE Sensitive     CIPROFLOXACIN >=4 RESISTANT Resistant     GENTAMICIN <=1 SENSITIVE Sensitive     IMIPENEM <=0.25 SENSITIVE Sensitive     NITROFURANTOIN <=16 SENSITIVE Sensitive     TRIMETH/SULFA <=20 SENSITIVE Sensitive     AMPICILLIN/SULBACTAM 16 INTERMEDIATE Intermediate     PIP/TAZO 8 SENSITIVE Sensitive ug/mL    * >=100,000 COLONIES/mL ESCHERICHIA COLI  MRSA Next Gen by PCR, Nasal     Status: None   Collection Time: 02/18/23  3:05 AM   Specimen: Nasal Mucosa; Nasal Swab  Result Value Ref Range Status   MRSA by PCR Next Gen NOT DETECTED NOT DETECTED Final    Comment: (NOTE) The GeneXpert MRSA Assay (FDA approved for NASAL specimens only), is one component of a comprehensive MRSA colonization surveillance program. It is not intended to diagnose MRSA infection nor to guide or monitor treatment for MRSA infections. Test performance is not FDA approved in patients less than 33 years old. Performed at [facility], [address] [place], [place] [address]   Respiratory (~20 pathogens) panel by PCR     Status: None   Collection Time: 02/18/23  7:07 AM   Specimen: Nasopharyngeal Swab; Respiratory  Result Value Ref Range Status   Adenovirus NOT DETECTED NOT DETECTED Final   Coronavirus 229E NOT DETECTED NOT DETECTED Final    Comment: (NOTE) The Coronavirus on the Respiratory Panel, DOES NOT test for the novel  Coronavirus (2019 nCoV)    Coronavirus HKU1 NOT DETECTED NOT DETECTED Final   Coronavirus  NL63 NOT DETECTED NOT DETECTED Final   Coronavirus OC43 NOT DETECTED NOT DETECTED Final   Metapneumovirus NOT DETECTED NOT DETECTED Final   Rhinovirus / Enterovirus NOT DETECTED NOT DETECTED Final   Influenza A NOT DETECTED NOT DETECTED Final   Influenza B NOT DETECTED NOT DETECTED Final   Parainfluenza Virus 1 NOT DETECTED NOT DETECTED Final   Parainfluenza Virus 2 NOT DETECTED NOT DETECTED Final   Parainfluenza Virus 3 NOT DETECTED NOT DETECTED Final   Parainfluenza Virus 4 NOT DETECTED NOT DETECTED Final   Respiratory Syncytial Virus NOT DETECTED NOT DETECTED Final   Bordetella pertussis NOT DETECTED NOT DETECTED Final   Bordetella Parapertussis NOT DETECTED NOT DETECTED Final   Chlamydophila pneumoniae NOT DETECTED NOT DETECTED Final   Mycoplasma pneumoniae NOT DETECTED NOT DETECTED Final    Comment: Performed at [facility], [address] [place], [place] [address]  SARS Coronavirus 2 by RT PCR (hospital order, performed in [facility] hospital lab) *cepheid single result test* Anterior Nasal Swab     Status: None   Collection Time: 02/18/23  7:07 AM   Specimen: Anterior Nasal Swab  Result Value Ref Range Status   SARS Coronavirus 2 by RT PCR NEGATIVE NEGATIVE Final    Comment: Performed at [facility], [address], [place], [place] [address]    FURTHER DISCHARGE INSTRUCTIONS:  Get Medicines reviewed and adjusted: Please take all your medications with you for your next visit with your Primary MD  Laboratory/radiological data: Please request your Primary MD to go over all hospital tests and procedure/radiological results at the follow up, please ask your Primary MD to get all Hospital records  sent to his/her office.  In some cases, they will be blood work, cultures and biopsy results pending at the time of your discharge. Please request that your primary care M.D. goes through all the records of your hospital data and follows up on these results.  Also Note  the following: If you experience worsening of your admission symptoms, develop shortness of breath, life threatening emergency, suicidal or homicidal thoughts you must seek medical attention immediately by calling 911 or calling your MD immediately  if symptoms less severe.  You must read complete instructions/literature along with all the possible adverse reactions/side effects for all the Medicines you take and that have been prescribed to you. Take any new Medicines after you have completely understood and accpet all the possible adverse reactions/side effects.   Do not drive when taking Pain medications or sleeping medications (Benzodaizepines)  Do not take more than prescribed Pain, Sleep and Anxiety Medications. It is not advisable to combine anxiety,sleep and pain medications without talking with your primary care practitioner  Special Instructions: If you have smoked or chewed Tobacco  in the last 2 yrs please stop smoking, stop any regular Alcohol  and or any Recreational drug use.  Wear Seat belts while driving.  Please note: You were cared for by a hospitalist during your hospital stay. Once you are discharged, your primary care physician will handle any further medical issues. Please note that NO REFILLS for any discharge medications will be authorized once you are discharged, as it is imperative that you return to your primary care physician (or establish a relationship with a primary care physician if you do not have one) for your post hospital discharge needs so that they can reassess your need for medications and monitor your lab values.  Total Time spent coordinating discharge including counseling, education and face to face time equals greater than 30 minutes.  Signed: Jeoffrey Massed 02/19/2023 10:11 AM

## 2023-02-20 ENCOUNTER — Other Ambulatory Visit (HOSPITAL_COMMUNITY): Payer: Self-pay

## 2023-02-20 NOTE — Telephone Encounter (Signed)
 This encounter was created in error - please disregard.

## 2023-02-22 LAB — CULTURE, BLOOD (ROUTINE X 2)
Culture: NO GROWTH
Culture: NO GROWTH

## 2023-03-31 DEATH — deceased

## 2023-10-05 ENCOUNTER — Other Ambulatory Visit: Payer: Self-pay

## 2023-10-25 ENCOUNTER — Other Ambulatory Visit (HOSPITAL_COMMUNITY): Payer: Self-pay
